# Patient Record
Sex: Male | Born: 1945 | Race: White | Hispanic: No | Smoking: Never smoker
Health system: Southern US, Community
[De-identification: ages and names within clinical notes are randomized; demographics above are authoritative.]

## PROBLEM LIST (undated history)

## (undated) DIAGNOSIS — I119 Hypertensive heart 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: Secondary | ICD-10-CM

## (undated) DIAGNOSIS — N400 Enlarged prostate without lower urinary tract symptoms: Secondary | ICD-10-CM

## (undated) DIAGNOSIS — E785 Hyperlipidemia, unspecified: Secondary | ICD-10-CM

## (undated) DIAGNOSIS — R972 Elevated prostate specific antigen [PSA]: Secondary | ICD-10-CM

## (undated) DIAGNOSIS — G4733 Obstructive sleep apnea (adult) (pediatric): Secondary | ICD-10-CM

## (undated) DIAGNOSIS — M1710 Unilateral primary osteoarthritis, unspecified knee: Secondary | ICD-10-CM

## (undated) DIAGNOSIS — I10 Essential (primary) hypertension: Secondary | ICD-10-CM

## (undated) DIAGNOSIS — I499 Cardiac arrhythmia, unspecified: Secondary | ICD-10-CM

## (undated) DIAGNOSIS — M75102 Unspecified rotator cuff tear or rupture of left shoulder, not specified as traumatic: Secondary | ICD-10-CM

## (undated) HISTORY — DX: Hypertensive heart 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: I11.9

## (undated) HISTORY — DX: Elevated prostate specific antigen (PSA): R97.20

## (undated) HISTORY — DX: Unspecified rotator cuff tear or rupture of left shoulder, not specified as traumatic: M75.102

## (undated) HISTORY — PX: REPLACEMENT TOTAL KNEE: SUR1224

## (undated) HISTORY — DX: Benign prostatic hyperplasia without lower urinary tract symptoms: N40.0

## (undated) HISTORY — DX: Cardiac arrhythmia, unspecified: I49.9

## (undated) HISTORY — PX: CATARACT EXTRACTION W/ INTRAOCULAR LENS IMPLANT: SHX1309

## (undated) HISTORY — DX: Essential (primary) hypertension: I10

## (undated) HISTORY — DX: Hyperlipidemia, unspecified: E78.5

## (undated) HISTORY — DX: Unilateral primary osteoarthritis, unspecified knee: M17.10

## (undated) HISTORY — DX: Obstructive sleep apnea (adult) (pediatric): G47.33

## (undated) HISTORY — PX: KNEE SURGERY: SHX244

---

## 2014-02-15 ENCOUNTER — Ambulatory Visit: Payer: Self-pay

## 2015-04-20 DIAGNOSIS — J019 Acute sinusitis, unspecified: Secondary | ICD-10-CM | POA: Diagnosis not present

## 2015-04-20 DIAGNOSIS — J111 Influenza due to unidentified influenza virus with other respiratory manifestations: Secondary | ICD-10-CM | POA: Diagnosis not present

## 2015-06-03 DIAGNOSIS — E785 Hyperlipidemia, unspecified: Secondary | ICD-10-CM | POA: Insufficient documentation

## 2015-06-03 DIAGNOSIS — I499 Cardiac arrhythmia, unspecified: Secondary | ICD-10-CM

## 2015-06-03 DIAGNOSIS — M75102 Unspecified rotator cuff tear or rupture of left shoulder, not specified as traumatic: Secondary | ICD-10-CM

## 2015-06-03 DIAGNOSIS — M179 Osteoarthritis of knee, unspecified: Secondary | ICD-10-CM

## 2015-06-03 DIAGNOSIS — Z298 Encounter for other specified prophylactic measures: Secondary | ICD-10-CM | POA: Diagnosis not present

## 2015-06-03 DIAGNOSIS — I119 Hypertensive heart 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: Secondary | ICD-10-CM

## 2015-06-03 DIAGNOSIS — I498 Other specified cardiac arrhythmias: Secondary | ICD-10-CM

## 2015-06-03 DIAGNOSIS — I10 Essential (primary) hypertension: Secondary | ICD-10-CM | POA: Diagnosis not present

## 2015-06-03 DIAGNOSIS — M170 Bilateral primary osteoarthritis of knee: Secondary | ICD-10-CM | POA: Diagnosis not present

## 2015-06-03 DIAGNOSIS — M1710 Unilateral primary osteoarthritis, unspecified knee: Secondary | ICD-10-CM

## 2015-06-03 HISTORY — DX: Other specified cardiac arrhythmias: I49.8

## 2015-06-03 HISTORY — DX: Hypertensive heart 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: I11.9

## 2015-06-03 HISTORY — DX: Osteoarthritis of knee, unspecified: M17.9

## 2015-06-03 HISTORY — DX: Unilateral primary osteoarthritis, unspecified knee: M17.10

## 2015-06-03 HISTORY — DX: Unspecified rotator cuff tear or rupture of left shoulder, not specified as traumatic: M75.102

## 2015-06-09 DIAGNOSIS — M7502 Adhesive capsulitis of left shoulder: Secondary | ICD-10-CM | POA: Diagnosis not present

## 2015-06-09 DIAGNOSIS — M25512 Pain in left shoulder: Secondary | ICD-10-CM | POA: Diagnosis not present

## 2015-06-10 DIAGNOSIS — I10 Essential (primary) hypertension: Secondary | ICD-10-CM | POA: Insufficient documentation

## 2015-06-10 DIAGNOSIS — I1A0 Resistant hypertension: Secondary | ICD-10-CM | POA: Insufficient documentation

## 2015-06-12 DIAGNOSIS — M25512 Pain in left shoulder: Secondary | ICD-10-CM | POA: Diagnosis not present

## 2015-06-12 DIAGNOSIS — R937 Abnormal findings on diagnostic imaging of other parts of musculoskeletal system: Secondary | ICD-10-CM | POA: Diagnosis not present

## 2015-06-12 DIAGNOSIS — M19012 Primary osteoarthritis, left shoulder: Secondary | ICD-10-CM | POA: Diagnosis not present

## 2015-06-13 DIAGNOSIS — I10 Essential (primary) hypertension: Secondary | ICD-10-CM | POA: Diagnosis not present

## 2015-06-16 DIAGNOSIS — M7502 Adhesive capsulitis of left shoulder: Secondary | ICD-10-CM | POA: Diagnosis not present

## 2015-06-16 DIAGNOSIS — M25512 Pain in left shoulder: Secondary | ICD-10-CM | POA: Diagnosis not present

## 2015-06-16 DIAGNOSIS — M7552 Bursitis of left shoulder: Secondary | ICD-10-CM | POA: Diagnosis not present

## 2015-06-18 DIAGNOSIS — M7552 Bursitis of left shoulder: Secondary | ICD-10-CM | POA: Diagnosis not present

## 2015-06-18 DIAGNOSIS — M7502 Adhesive capsulitis of left shoulder: Secondary | ICD-10-CM | POA: Diagnosis not present

## 2015-06-18 DIAGNOSIS — M25512 Pain in left shoulder: Secondary | ICD-10-CM | POA: Diagnosis not present

## 2015-06-24 DIAGNOSIS — M7552 Bursitis of left shoulder: Secondary | ICD-10-CM | POA: Diagnosis not present

## 2015-06-24 DIAGNOSIS — M7502 Adhesive capsulitis of left shoulder: Secondary | ICD-10-CM | POA: Diagnosis not present

## 2015-06-24 DIAGNOSIS — M25512 Pain in left shoulder: Secondary | ICD-10-CM | POA: Diagnosis not present

## 2015-06-26 DIAGNOSIS — M25512 Pain in left shoulder: Secondary | ICD-10-CM | POA: Diagnosis not present

## 2015-06-26 DIAGNOSIS — M7502 Adhesive capsulitis of left shoulder: Secondary | ICD-10-CM | POA: Diagnosis not present

## 2015-06-26 DIAGNOSIS — M7552 Bursitis of left shoulder: Secondary | ICD-10-CM | POA: Diagnosis not present

## 2015-06-30 DIAGNOSIS — M7552 Bursitis of left shoulder: Secondary | ICD-10-CM | POA: Diagnosis not present

## 2015-06-30 DIAGNOSIS — M25512 Pain in left shoulder: Secondary | ICD-10-CM | POA: Diagnosis not present

## 2015-06-30 DIAGNOSIS — M7502 Adhesive capsulitis of left shoulder: Secondary | ICD-10-CM | POA: Diagnosis not present

## 2015-07-02 DIAGNOSIS — M25512 Pain in left shoulder: Secondary | ICD-10-CM | POA: Diagnosis not present

## 2015-07-02 DIAGNOSIS — M7502 Adhesive capsulitis of left shoulder: Secondary | ICD-10-CM | POA: Diagnosis not present

## 2015-07-02 DIAGNOSIS — M7552 Bursitis of left shoulder: Secondary | ICD-10-CM | POA: Diagnosis not present

## 2015-07-03 DIAGNOSIS — M25512 Pain in left shoulder: Secondary | ICD-10-CM | POA: Diagnosis not present

## 2015-07-03 DIAGNOSIS — M7502 Adhesive capsulitis of left shoulder: Secondary | ICD-10-CM | POA: Diagnosis not present

## 2015-07-03 DIAGNOSIS — M7552 Bursitis of left shoulder: Secondary | ICD-10-CM | POA: Diagnosis not present

## 2015-07-07 DIAGNOSIS — M7552 Bursitis of left shoulder: Secondary | ICD-10-CM | POA: Diagnosis not present

## 2015-07-07 DIAGNOSIS — M25512 Pain in left shoulder: Secondary | ICD-10-CM | POA: Diagnosis not present

## 2015-07-07 DIAGNOSIS — M7502 Adhesive capsulitis of left shoulder: Secondary | ICD-10-CM | POA: Diagnosis not present

## 2015-07-09 DIAGNOSIS — M7502 Adhesive capsulitis of left shoulder: Secondary | ICD-10-CM | POA: Diagnosis not present

## 2015-07-09 DIAGNOSIS — M25512 Pain in left shoulder: Secondary | ICD-10-CM | POA: Diagnosis not present

## 2015-07-09 DIAGNOSIS — M7552 Bursitis of left shoulder: Secondary | ICD-10-CM | POA: Diagnosis not present

## 2015-07-11 DIAGNOSIS — M25512 Pain in left shoulder: Secondary | ICD-10-CM | POA: Diagnosis not present

## 2015-07-11 DIAGNOSIS — M7502 Adhesive capsulitis of left shoulder: Secondary | ICD-10-CM | POA: Diagnosis not present

## 2015-07-11 DIAGNOSIS — M7552 Bursitis of left shoulder: Secondary | ICD-10-CM | POA: Diagnosis not present

## 2015-07-23 DIAGNOSIS — M7502 Adhesive capsulitis of left shoulder: Secondary | ICD-10-CM | POA: Diagnosis not present

## 2015-08-18 DIAGNOSIS — E785 Hyperlipidemia, unspecified: Secondary | ICD-10-CM | POA: Diagnosis not present

## 2015-08-18 DIAGNOSIS — I10 Essential (primary) hypertension: Secondary | ICD-10-CM | POA: Diagnosis not present

## 2015-10-06 DIAGNOSIS — H26492 Other secondary cataract, left eye: Secondary | ICD-10-CM | POA: Diagnosis not present

## 2015-10-06 DIAGNOSIS — H52203 Unspecified astigmatism, bilateral: Secondary | ICD-10-CM | POA: Diagnosis not present

## 2015-10-06 DIAGNOSIS — Z961 Presence of intraocular lens: Secondary | ICD-10-CM | POA: Diagnosis not present

## 2015-10-30 DIAGNOSIS — H26492 Other secondary cataract, left eye: Secondary | ICD-10-CM | POA: Diagnosis not present

## 2015-11-26 DIAGNOSIS — I10 Essential (primary) hypertension: Secondary | ICD-10-CM | POA: Diagnosis not present

## 2015-11-26 DIAGNOSIS — E785 Hyperlipidemia, unspecified: Secondary | ICD-10-CM | POA: Diagnosis not present

## 2015-12-19 DIAGNOSIS — I10 Essential (primary) hypertension: Secondary | ICD-10-CM | POA: Diagnosis not present

## 2016-01-08 ENCOUNTER — Other Ambulatory Visit (HOSPITAL_BASED_OUTPATIENT_CLINIC_OR_DEPARTMENT_OTHER): Payer: Self-pay

## 2016-01-08 DIAGNOSIS — R0683 Snoring: Secondary | ICD-10-CM

## 2016-01-08 DIAGNOSIS — I10 Essential (primary) hypertension: Secondary | ICD-10-CM

## 2016-02-11 ENCOUNTER — Ambulatory Visit (HOSPITAL_BASED_OUTPATIENT_CLINIC_OR_DEPARTMENT_OTHER): Payer: PPO | Attending: Cardiology | Admitting: Internal Medicine

## 2016-02-11 VITALS — Ht 72.0 in | Wt 222.0 lb

## 2016-02-11 DIAGNOSIS — R0902 Hypoxemia: Secondary | ICD-10-CM | POA: Insufficient documentation

## 2016-02-11 DIAGNOSIS — R0683 Snoring: Secondary | ICD-10-CM

## 2016-02-11 DIAGNOSIS — I10 Essential (primary) hypertension: Secondary | ICD-10-CM

## 2016-02-11 DIAGNOSIS — G4733 Obstructive sleep apnea (adult) (pediatric): Secondary | ICD-10-CM | POA: Insufficient documentation

## 2016-02-21 DIAGNOSIS — R0683 Snoring: Secondary | ICD-10-CM

## 2016-02-21 NOTE — Procedures (Signed)
  Patient Name: Jeremy Fox, Jeremy Fox Date: 02/11/2016 Gender: Male D.O.B: October 27, 1945 Age (years): 70 Referring Provider: Park Liter Height (inches): 60 Interpreting Physician: Baird Lyons MD, ABSM Weight (lbs): 222 RPSGT: Carolin Coy BMI: 30 MRN: [id number] Neck Size: 15.50 CLINICAL INFORMATION Sleep Study Type: NPSG  Indication for sleep study: Hypertension, Obesity, Snoring  Epworth Sleepiness Score:  SLEEP STUDY TECHNIQUE As per the AASM Manual for the Scoring of Sleep and Associated Events v2.3 (April 2016) with a hypopnea requiring 4% desaturations.  The channels recorded and monitored were frontal, central and occipital EEG, electrooculogram (EOG), submentalis EMG (chin), nasal and oral airflow, thoracic and abdominal wall motion, anterior tibialis EMG, snore microphone, electrocardiogram, and pulse oximetry.  MEDICATIONS Medications self-administered by patient taken the night of the study : none reported  SLEEP ARCHITECTURE The study was initiated at 10:38:52 PM and ended at 5:11:22 AM.  Sleep onset time was 8.4 minutes and the sleep efficiency was 73.3%. The total sleep time was 287.5 minutes.  Stage REM latency was 129.5 minutes.  The patient spent 13.39% of the night in stage N1 sleep, 68.52% in stage N2 sleep, 0.00% in stage N3 and 18.09% in REM.  Alpha intrusion was absent.  Supine sleep was 42.96%.  RESPIRATORY PARAMETERS The overall apnea/hypopnea index (AHI) was 44.2 per hour. There were 79 total apneas, including 68 obstructive, 3 central and 8 mixed apneas. There were 133 hypopneas and 10 RERAs.  The AHI during Stage REM sleep was 3.5 per hour.  AHI while supine was 69.5 per hour.  The mean oxygen saturation was 92.23%. The minimum SpO2 during sleep was 72.00%.  Moderate snoring was noted during this study.  CARDIAC DATA The 2 lead EKG demonstrated sinus rhythm. The mean heart rate was 64.58 beats per minute. Other EKG findings  include: PVCs.  LEG MOVEMENT DATA The total PLMS were 1 with a resulting PLMS index of 0.21. Associated arousal with leg movement index was 0.2 .  IMPRESSIONS - Severe obstructive sleep apnea occurred during this study (AHI = 44.2/h). - No significant central sleep apnea occurred during this study (CAI = 0.6/h). - Moderate oxygen desaturation was noted during this study (Min O2 = 72.00%). - The patient snored with Moderate snoring volume. - EKG findings include PVCs. - Clinically significant periodic limb movements did not occur during sleep. No significant associated arousals.  DIAGNOSIS - Obstructive Sleep Apnea (327.23 [G47.33 ICD-10]) - Nocturnal Hypoxemia (327.26 [G47.36 ICD-10]  RECOMMENDATIONS - Therapeutic CPAP titration to determine optimal pressure required to alleviate sleep disordered breathing. - Positional therapy avoiding supine position during sleep. - Avoid alcohol, sedatives and other CNS depressants that may worsen sleep apnea and disrupt normal sleep architecture. - Sleep hygiene should be reviewed to assess factors that may improve sleep quality. - Weight management and regular exercise should be initiated or continued if appropriate.  [Electronically signed] 02/21/2016 12:37 PM  Baird Lyons MD, [place], American Board of Sleep Medicine   NPI: [id number]  [place], [facility]  ELECTRONICALLY SIGNED ON:  02/21/2016, 12:35 PM [place] PH: [phone]   FX: [phone] [place]

## 2016-03-29 DIAGNOSIS — I10 Essential (primary) hypertension: Secondary | ICD-10-CM | POA: Diagnosis not present

## 2016-03-29 DIAGNOSIS — E785 Hyperlipidemia, unspecified: Secondary | ICD-10-CM | POA: Diagnosis not present

## 2016-03-29 DIAGNOSIS — G4733 Obstructive sleep apnea (adult) (pediatric): Secondary | ICD-10-CM | POA: Insufficient documentation

## 2016-04-15 ENCOUNTER — Other Ambulatory Visit (HOSPITAL_BASED_OUTPATIENT_CLINIC_OR_DEPARTMENT_OTHER): Payer: Self-pay

## 2016-04-15 DIAGNOSIS — Z23 Encounter for immunization: Secondary | ICD-10-CM | POA: Diagnosis not present

## 2016-04-15 DIAGNOSIS — Z0000 Encounter for general adult medical examination without abnormal findings: Secondary | ICD-10-CM | POA: Diagnosis not present

## 2016-04-15 DIAGNOSIS — N400 Enlarged prostate without lower urinary tract symptoms: Secondary | ICD-10-CM | POA: Diagnosis not present

## 2016-04-15 DIAGNOSIS — Z1159 Encounter for screening for other viral diseases: Secondary | ICD-10-CM | POA: Diagnosis not present

## 2016-04-15 DIAGNOSIS — R0683 Snoring: Secondary | ICD-10-CM

## 2016-04-15 DIAGNOSIS — Z1212 Encounter for screening for malignant neoplasm of rectum: Secondary | ICD-10-CM | POA: Diagnosis not present

## 2016-04-15 DIAGNOSIS — G4733 Obstructive sleep apnea (adult) (pediatric): Secondary | ICD-10-CM

## 2016-04-15 LAB — LIPID PANEL
Cholesterol: 188 (ref 0–200)
HDL: 45 (ref 35–70)
LDL Cholesterol: 122
Triglycerides: 103 (ref 40–160)

## 2016-04-15 LAB — HEPATIC FUNCTION PANEL
ALT: 20 (ref 10–40)
AST: 29 (ref 14–40)
Alkaline Phosphatase: 72 (ref 25–125)
Bilirubin, Total: 0.6

## 2016-04-15 LAB — BASIC METABOLIC PANEL
BUN: 15 (ref 4–21)
Creatinine: 1.2 (ref 0.6–1.3)
Glucose: 65
Potassium: 4 (ref 3.4–5.3)
Sodium: 136 — AB (ref 137–147)

## 2016-04-15 LAB — HM HEPATITIS C SCREENING LAB: HM Hepatitis Screen: NEGATIVE

## 2016-04-20 ENCOUNTER — Ambulatory Visit (HOSPITAL_BASED_OUTPATIENT_CLINIC_OR_DEPARTMENT_OTHER): Payer: PPO | Attending: Cardiology | Admitting: Internal Medicine

## 2016-04-20 VITALS — Ht 72.0 in | Wt 222.0 lb

## 2016-04-20 DIAGNOSIS — R0683 Snoring: Secondary | ICD-10-CM | POA: Diagnosis not present

## 2016-04-20 DIAGNOSIS — G4733 Obstructive sleep apnea (adult) (pediatric): Secondary | ICD-10-CM | POA: Insufficient documentation

## 2016-04-27 DIAGNOSIS — R972 Elevated prostate specific antigen [PSA]: Secondary | ICD-10-CM | POA: Diagnosis not present

## 2016-04-27 DIAGNOSIS — R351 Nocturia: Secondary | ICD-10-CM | POA: Diagnosis not present

## 2016-04-27 DIAGNOSIS — N138 Other obstructive and reflux uropathy: Secondary | ICD-10-CM | POA: Diagnosis not present

## 2016-04-27 DIAGNOSIS — N401 Enlarged prostate with lower urinary tract symptoms: Secondary | ICD-10-CM | POA: Diagnosis not present

## 2016-05-08 NOTE — Procedures (Signed)
  Patient Name: Jeremy Fox, Jeremy Fox Date: 04/20/2016 Gender: Male D.O.B: Sep 11, 1945 Age (years): 69 Referring Provider: Park Liter Height (inches): 72 Interpreting Physician: Baird Lyons MD, ABSM Weight (lbs): 222 RPSGT: Madelon Lips BMI: 30 MRN: [id number] Neck Size: 15.50 CLINICAL INFORMATION The patient is referred for a CPAP titration to treat sleep apnea.   Date of NPSG, Split Night or HST:  Diagnostic NPSG 02/11/16  AHI 44.2/ hr, desaturation to 72%, body weight 222 lbs  SLEEP STUDY TECHNIQUE As per the AASM Manual for the Scoring of Sleep and Associated Events v2.3 (April 2016) with a hypopnea requiring 4% desaturations.  The channels recorded and monitored were frontal, central and occipital EEG, electrooculogram (EOG), submentalis EMG (chin), nasal and oral airflow, thoracic and abdominal wall motion, anterior tibialis EMG, snore microphone, electrocardiogram, and pulse oximetry. Continuous positive airway pressure (CPAP) was initiated at the beginning of the study and titrated to treat sleep-disordered breathing.  MEDICATIONS Medications self-administered by patient taken the night of the study : none reported  TECHNICIAN COMMENTS Comments added by technician: PATIENT TOLERATED CPAP WITHOUT DIFFICULTY. BATHROOM BREAK 2X  Comments added by scorer: N/A  RESPIRATORY PARAMETERS Optimal PAP Pressure (cm): 16 AHI at Optimal Pressure (/hr): 0.0 Overall Minimal O2 (%): 83.00 Supine % at Optimal Pressure (%): 100 Minimal O2 at Optimal Pressure (%): 93.0    SLEEP ARCHITECTURE The study was initiated at 10:49:22 PM and ended at 4:54:19 AM.  Sleep onset time was 25.6 minutes and the sleep efficiency was 79.3%. The total sleep time was 289.5 minutes.  The patient spent 8.98% of the night in stage N1 sleep, 73.75% in stage N2 sleep, 0.00% in stage N3 and 17.27% in REM.Stage REM latency was 112.5 minutes  Wake after sleep onset was 49.8. Alpha intrusion was absent.  Supine sleep was 58.91%.  CARDIAC DATA The 2 lead EKG demonstrated sinus rhythm. The mean heart rate was 58.87 beats per minute. Other EKG findings include: PVCs.  LEG MOVEMENT DATA The total Periodic Limb Movements of Sleep (PLMS) were 197. The PLMS index was 40.83. A PLMS index of <15 is considered normal in adults.  IMPRESSIONS - The optimal PAP pressure was 16 cm of water. - Central sleep apnea was not noted during this titration (CAI = 0.0/h). - Moderate oxygen desaturations were observed during this titration (min O2 = 83.00%). - The patient snored with Moderate snoring volume during this titration study. - 2-lead EKG demonstrated: PVCs - Moderate periodic limb movements were observed during this study. Arousals associated with PLMs were rare.  DIAGNOSIS - Obstructive Sleep Apnea (327.23 [G47.33 ICD-10])  RECOMMENDATIONS - Trial of CPAP therapy on 16 cm H2O with a Medium size Fisher&Paykel Full Face Mask Simplus mask and heated humidification. - Avoid alcohol, sedatives and other CNS depressants that may worsen sleep apnea and disrupt normal sleep architecture. - Sleep hygiene should be reviewed to assess factors that may improve sleep quality. - Weight management and regular exercise should be initiated or continued. -  [Electronically signed] 05/08/2016 10:35 AM  Baird Lyons MD, ABSM Diplomate, American Board of Sleep Medicine   NPI: [id number]  [place], American Board of Sleep Medicine  ELECTRONICALLY SIGNED ON:  05/08/2016, 10:34 AM [place] PH: [phone]   FX: [phone] [place]

## 2016-05-11 ENCOUNTER — Encounter: Payer: Self-pay | Admitting: Pulmonary Disease

## 2016-05-11 ENCOUNTER — Ambulatory Visit (INDEPENDENT_AMBULATORY_CARE_PROVIDER_SITE_OTHER): Payer: PPO | Admitting: Pulmonary Disease

## 2016-05-11 VITALS — BP 118/80 | HR 82 | Ht 72.0 in | Wt 233.0 lb

## 2016-05-11 DIAGNOSIS — G4733 Obstructive sleep apnea (adult) (pediatric): Secondary | ICD-10-CM | POA: Diagnosis not present

## 2016-05-11 NOTE — Patient Instructions (Addendum)
CPAP therapy on 16 cm H2O with a Medium size Fisher&Paykel Full Face Mask Simplus mask and heated humidification

## 2016-05-11 NOTE — Progress Notes (Signed)
Subjective:    Patient ID: Jeremy Fox, male    DOB: [birth year], 71 y.o.   MRN: [id number]  HPI   Chief Complaint  Patient presents with  . Pulm Consult    Cardiologist referred.     71 year old man referred by his cardiologist for evaluation of sleep apnea. He reports loud snoring and has been told by family members and friends that they have witnessed apneas while he is sleeping. Epworth sleepiness score is 16 and a report sleepiness while sitting and reading, watching TV, lying down to rest in the afternoons or sitting quietly after lunch. Bedtime is between 8 and 10 PM, sleep latency is minimal, he sleeps on his right side with one to 2 pillows, reports frequent nocturnal awakenings without nocturia and is out of bed by 7 AM still feeling tired with occasional dryness of mouth. He gained about 10 pounds over the last 2 years. There is no history suggestive of cataplexy, sleep paralysis or parasomnias  He worked for [employer] for 30 years and is now retired He has hypertension controlled on 2 medications.  PSG 02/2016 showed severe OSA with AHI 44/hour and lowest desaturation to 72% this was worse in the supine position with AHI 69/hour.  CPAP titration was performed on 04/20/16 with correction of events on CPAP of 16 cm with a medium fullface mask. He feels that he slept very well that night and only workup once he was able to sleep without interruption until 5 AM and felt rested when he woke up    Past Medical History:  Diagnosis Date  . Dyslipidemia   . Essential hypertension   . OSA (obstructive sleep apnea)      No past surgical history on file.  Allergies  Allergen Reactions  . Sulfa Antibiotics Rash     Social History   Social History  . Marital status: Single    Spouse name: N/A  . Number of children: N/A  . Years of education: N/A   Occupational History  . Not on file.   Social History Main Topics  . Smoking status: Never Smoker  . Smokeless  tobacco: Never Used  . Alcohol use Yes  . Drug use: No  . Sexual activity: Not on file   Other Topics Concern  . Not on file   Social History Narrative  . No narrative on file     Family History  Problem Relation Age of Onset  . Heart attack Father      Review of Systems Constitutional: negative for anorexia, fevers and sweats  Eyes: negative for irritation, redness and visual disturbance  Ears, nose, mouth, throat, and face: negative for earaches, epistaxis, nasal congestion and sore throat  Respiratory: negative for cough, dyspnea on exertion, sputum and wheezing  Cardiovascular: negative for chest pain, dyspnea, lower extremity edema, orthopnea, palpitations and syncope  Gastrointestinal: negative for abdominal pain, constipation, diarrhea, melena, nausea and vomiting  Genitourinary:negative for dysuria, frequency and hematuria  Hematologic/lymphatic: negative for bleeding, easy bruising and lymphadenopathy  Musculoskeletal:negative for arthralgias, muscle weakness and stiff joints  Neurological: negative for coordination problems, gait problems, headaches and weakness  Endocrine: negative for diabetic symptoms including polydipsia, polyuria and weight loss     Objective:   Physical Exam  Gen. Pleasant, obese, in no distress, normal affect ENT - no lesions, no post nasal drip, class 2-3 airway Neck: No JVD, no thyromegaly, no carotid bruits Lungs: no use of accessory muscles, no dullness to percussion, decreased without  rales or rhonchi  Cardiovascular: Rhythm regular, heart sounds  normal, no murmurs or gallops, no peripheral edema Abdomen: soft and non-tender, no hepatosplenomegaly, BS normal. Musculoskeletal: No deformities, no cyanosis or clubbing Neuro:  alert, non focal, no tremors       Assessment & Plan:

## 2016-05-11 NOTE — Assessment & Plan Note (Signed)
We will start him on CPAP therapy on 16 cm H2O with a Medium size Fisher&Paykel Full Face Mask Simplus mask and heated humidification  Download will be obtained in 4 weeks and we will confirm good control of events in good compliance. If he is unable to tolerate high pressure we can change him to auto settings 10-16 cm  Weight loss encouraged, compliance with goal of at least 4-6 hrs every night is the expectation. Advised against medications with sedative side effects Cautioned against driving when sleepy - understanding that sleepiness will vary on a day to day basis

## 2016-05-26 DIAGNOSIS — G4733 Obstructive sleep apnea (adult) (pediatric): Secondary | ICD-10-CM | POA: Diagnosis not present

## 2016-06-26 DIAGNOSIS — G4733 Obstructive sleep apnea (adult) (pediatric): Secondary | ICD-10-CM | POA: Diagnosis not present

## 2016-07-04 ENCOUNTER — Encounter: Payer: Self-pay | Admitting: Pulmonary Disease

## 2016-07-05 DIAGNOSIS — R768 Other specified abnormal immunological findings in serum: Secondary | ICD-10-CM | POA: Diagnosis not present

## 2016-07-06 ENCOUNTER — Ambulatory Visit (INDEPENDENT_AMBULATORY_CARE_PROVIDER_SITE_OTHER): Payer: PPO | Admitting: Pulmonary Disease

## 2016-07-06 ENCOUNTER — Encounter: Payer: Self-pay | Admitting: Pulmonary Disease

## 2016-07-06 DIAGNOSIS — I10 Essential (primary) hypertension: Secondary | ICD-10-CM

## 2016-07-06 DIAGNOSIS — G4733 Obstructive sleep apnea (adult) (pediatric): Secondary | ICD-10-CM | POA: Diagnosis not present

## 2016-07-06 NOTE — Progress Notes (Signed)
   Subjective:    Patient ID: Jeremy Fox, male    DOB: [birth year], 71 y.o.   MRN: [id number]  HPI  71 year old for FU of sleep apnea.  He worked for [employer] for 30 years  He has hypertension controlled on 2 medications.  He has settled down well with the CPAP machine, is tolerating pressure quite well. Had a large leak on his mask, tried nasal pillows but is now back to full face mask. He has noticed good improvement in his concentration, ability to stay awake , denies daytime sleepiness and has improved memory  Download shows excellent control of events on 16 cm with residual AHI 4/hour with mild leak and good compliance about 5.5 hours every night    Significant tests/ events reviewed  PSG 02/2016 showed severe OSA with AHI 44/hour and lowest desaturation to 72% this was worse in the supine position with AHI 69/hour.  CPAP titration  04/20/16 >> 16 cm with a medium fullface mask.   Review of Systems Patient denies significant dyspnea,cough, hemoptysis,  chest pain, palpitations, pedal edema, orthopnea, paroxysmal nocturnal dyspnea, lightheadedness, nausea, vomiting, abdominal or  leg pains      Objective:   Physical Exam  Gen. Pleasant, well-nourished, in no distress ENT - no thrush, no post nasal drip Neck: No JVD, no thyromegaly, no carotid bruits Lungs: no use of accessory muscles, no dullness to percussion, clear without rales or rhonchi  Cardiovascular: Rhythm regular, heart sounds  normal, no murmurs or gallops, no peripheral edema Musculoskeletal: No deformities, no cyanosis or clubbing        Assessment & Plan:

## 2016-07-06 NOTE — Assessment & Plan Note (Signed)
CPAP is set at 16 cm. Expectation is that you will use it 4-6 hours every night. We will set you up with a different DME  Weight loss encouraged, compliance with goal of at least 4-6 hrs every night is the expectation. Advised against medications with sedative side effects Cautioned against driving when sleepy - understanding that sleepiness will vary on a day to day basis

## 2016-07-06 NOTE — Patient Instructions (Signed)
Your CPAP is set at 16 cm. Expectation is that you will use it 4-6 hours every night. We will set you up with a different DME

## 2016-07-06 NOTE — Assessment & Plan Note (Signed)
Controlled.  

## 2016-07-26 DIAGNOSIS — R351 Nocturia: Secondary | ICD-10-CM | POA: Diagnosis not present

## 2016-07-26 DIAGNOSIS — N401 Enlarged prostate with lower urinary tract symptoms: Secondary | ICD-10-CM | POA: Diagnosis not present

## 2016-07-26 DIAGNOSIS — G4733 Obstructive sleep apnea (adult) (pediatric): Secondary | ICD-10-CM | POA: Diagnosis not present

## 2016-07-26 DIAGNOSIS — N138 Other obstructive and reflux uropathy: Secondary | ICD-10-CM | POA: Diagnosis not present

## 2016-07-26 DIAGNOSIS — R972 Elevated prostate specific antigen [PSA]: Secondary | ICD-10-CM | POA: Diagnosis not present

## 2016-07-29 DIAGNOSIS — G4733 Obstructive sleep apnea (adult) (pediatric): Secondary | ICD-10-CM | POA: Diagnosis not present

## 2016-07-29 DIAGNOSIS — E785 Hyperlipidemia, unspecified: Secondary | ICD-10-CM | POA: Diagnosis not present

## 2016-07-29 DIAGNOSIS — I10 Essential (primary) hypertension: Secondary | ICD-10-CM | POA: Diagnosis not present

## 2016-07-30 DIAGNOSIS — G4733 Obstructive sleep apnea (adult) (pediatric): Secondary | ICD-10-CM | POA: Diagnosis not present

## 2016-07-30 DIAGNOSIS — I10 Essential (primary) hypertension: Secondary | ICD-10-CM | POA: Diagnosis not present

## 2016-07-30 DIAGNOSIS — F446 Conversion disorder with sensory symptom or deficit: Secondary | ICD-10-CM | POA: Diagnosis not present

## 2016-07-30 DIAGNOSIS — Z8659 Personal history of other mental and behavioral disorders: Secondary | ICD-10-CM | POA: Diagnosis not present

## 2016-08-30 DIAGNOSIS — F446 Conversion disorder with sensory symptom or deficit: Secondary | ICD-10-CM | POA: Diagnosis not present

## 2016-08-30 DIAGNOSIS — G4733 Obstructive sleep apnea (adult) (pediatric): Secondary | ICD-10-CM | POA: Diagnosis not present

## 2016-08-30 DIAGNOSIS — Z8659 Personal history of other mental and behavioral disorders: Secondary | ICD-10-CM | POA: Diagnosis not present

## 2016-08-30 DIAGNOSIS — I10 Essential (primary) hypertension: Secondary | ICD-10-CM | POA: Diagnosis not present

## 2016-09-06 DIAGNOSIS — L309 Dermatitis, unspecified: Secondary | ICD-10-CM | POA: Diagnosis not present

## 2016-09-06 DIAGNOSIS — I10 Essential (primary) hypertension: Secondary | ICD-10-CM | POA: Diagnosis not present

## 2016-09-07 DIAGNOSIS — H52203 Unspecified astigmatism, bilateral: Secondary | ICD-10-CM | POA: Diagnosis not present

## 2016-09-07 DIAGNOSIS — Z961 Presence of intraocular lens: Secondary | ICD-10-CM | POA: Diagnosis not present

## 2016-09-29 DIAGNOSIS — F446 Conversion disorder with sensory symptom or deficit: Secondary | ICD-10-CM | POA: Diagnosis not present

## 2016-09-29 DIAGNOSIS — I10 Essential (primary) hypertension: Secondary | ICD-10-CM | POA: Diagnosis not present

## 2016-09-29 DIAGNOSIS — Z8659 Personal history of other mental and behavioral disorders: Secondary | ICD-10-CM | POA: Diagnosis not present

## 2016-09-29 DIAGNOSIS — G4733 Obstructive sleep apnea (adult) (pediatric): Secondary | ICD-10-CM | POA: Diagnosis not present

## 2016-10-11 DIAGNOSIS — G4733 Obstructive sleep apnea (adult) (pediatric): Secondary | ICD-10-CM | POA: Diagnosis not present

## 2016-10-12 ENCOUNTER — Encounter: Payer: Self-pay | Admitting: Adult Health

## 2016-10-12 ENCOUNTER — Ambulatory Visit (INDEPENDENT_AMBULATORY_CARE_PROVIDER_SITE_OTHER): Payer: PPO | Admitting: Adult Health

## 2016-10-12 DIAGNOSIS — G4733 Obstructive sleep apnea (adult) (pediatric): Secondary | ICD-10-CM | POA: Diagnosis not present

## 2016-10-12 NOTE — Assessment & Plan Note (Signed)
Well controlled on CPAP   Plan  Patient Instructions  Keep up the good work.  Wear CPAP At bedtime  For at least 4-6 hrs.  Keep active and healthy lifestyle.  Do not drive if sleepy.  Follow up Dr. Elsworth Soho  In 1 year and As needed

## 2016-10-12 NOTE — Progress Notes (Signed)
@Patient  ID: Jeremy Fox, male    DOB: 09-03-1945, 72 y.o.   MRN: [id number]  Chief Complaint  Patient presents with  . Follow-up    OSA     Referring provider: Daphene Calamity  HPI: 71 yo male followed for Severe OSA  TEST  Significant tests/ events reviewed  PSG 02/2016 showed severe OSA with AHI 44/hour and lowest desaturation to 72% this was worse in the supine position with AHI 69/hour.  CPAP titration  04/20/16 >> 16 cm with a medium fullface mask.   10/12/2016 Follow up : OSA  Patient returns for three-month follow-up. Patient has known severe sleep apnea. Patient says he is doing so much better. He feels rested and has a lot more energy. He says this summer. He has been able to do so many more activities in his normal. He is wearing his C Pap machine every single night for at least 5 hours. Download shows excellent compliance with average usage at 5.25 hours. AHI 1.3. Patient's on a set pressure of 16 cm H2O..    Allergies  Allergen Reactions  . Sulfa Antibiotics Rash     There is no immunization history on file for this patient.  Past Medical History:  Diagnosis Date  . Dyslipidemia   . Essential hypertension   . OSA (obstructive sleep apnea)     Tobacco History: History  Smoking Status  . Never Smoker  Smokeless Tobacco  . Never Used   Counseling given: Not Answered   Outpatient Encounter Prescriptions as of 10/12/2016  Medication Sig  . amLODipine-benazepril (LOTREL) 10-40 MG capsule Take 1 capsule by mouth daily.  . [DISCONTINUED] hydrochlorothiazide (HYDRODIURIL) 25 MG tablet Take 25 mg by mouth daily.   No facility-administered encounter medications on file as of 10/12/2016.      Review of Systems  Constitutional:   No  weight loss, night sweats,  Fevers, chills, fatigue, or  lassitude.  HEENT:   No headaches,  Difficulty swallowing,  Tooth/dental problems, or  Sore throat,                No sneezing, itching, ear ache, nasal  congestion, post nasal drip,   CV:  No chest pain,  Orthopnea, PND, swelling in lower extremities, anasarca, dizziness, palpitations, syncope.   GI  No heartburn, indigestion, abdominal pain, nausea, vomiting, diarrhea, change in bowel habits, loss of appetite, bloody stools.   Resp: No shortness of breath with exertion or at rest.  No excess mucus, no productive cough,  No non-productive cough,  No coughing up of blood.  No change in color of mucus.  No wheezing.  No chest wall deformity  Skin: no rash or lesions.  GU: no dysuria, change in color of urine, no urgency or frequency.  No flank pain, no hematuria   MS:  No joint pain or swelling.  No decreased range of motion.  No back pain.    Physical Exam  BP 126/68 (BP Location: Left Arm, Cuff Size: Normal)   Pulse 75   Ht 6' (1.829 m)   Wt 229 lb 9.6 oz (104.1 kg)   SpO2 97%   BMI 31.14 kg/m   GEN: A/Ox3; pleasant , NAD, overweight    HEENT:  [place]/AT,  EACs-clear, TMs-wnl, NOSE-clear, THROAT-clear, no lesions, no postnasal drip or exudate noted. Class 2 MP airway   NECK:  Supple w/ fair ROM; no JVD; normal carotid impulses w/o bruits; no thyromegaly or nodules palpated; no lymphadenopathy.    RESP  Clear  P & A; w/o, wheezes/ rales/ or rhonchi. no accessory muscle use, no dullness to percussion  CARD:  RRR, no m/r/g, no peripheral edema, pulses intact, no cyanosis or clubbing.  GI:   Soft & nt; nml bowel sounds; no organomegaly or masses detected.   Musco: Warm bil, no deformities or joint swelling noted.   Neuro: alert, no focal deficits noted.    Skin: Warm, no lesions or rashes    Lab Results:  CBC No results found for: WBC, RBC, HGB, HCT, PLT, MCV, MCH, MCHC, RDW, LYMPHSABS, MONOABS, EOSABS, BASOSABS  BMET No results found for: NA, K, CL, CO2, GLUCOSE, BUN, CREATININE, CALCIUM, GFRNONAA, GFRAA  BNP No results found for: BNP  ProBNP No results found for: PROBNP  Imaging: No results  found.   Assessment & Plan:   OSA (obstructive sleep apnea) Well controlled on CPAP   Plan  Patient Instructions  Keep up the good work.  Wear CPAP At bedtime  For at least 4-6 hrs.  Keep active and healthy lifestyle.  Do not drive if sleepy.  Follow up Dr. Elsworth Soho  In 1 year and As needed         Rexene Edison, NP 10/12/2016

## 2016-10-12 NOTE — Patient Instructions (Signed)
Keep up the good work.  Wear CPAP At bedtime  For at least 4-6 hrs.  Keep active and healthy lifestyle.  Do not drive if sleepy.  Follow up Dr. Elsworth Soho  In 1 year and As needed

## 2016-10-30 DIAGNOSIS — F446 Conversion disorder with sensory symptom or deficit: Secondary | ICD-10-CM | POA: Diagnosis not present

## 2016-10-30 DIAGNOSIS — I10 Essential (primary) hypertension: Secondary | ICD-10-CM | POA: Diagnosis not present

## 2016-10-30 DIAGNOSIS — Z8659 Personal history of other mental and behavioral disorders: Secondary | ICD-10-CM | POA: Diagnosis not present

## 2016-10-30 DIAGNOSIS — G4733 Obstructive sleep apnea (adult) (pediatric): Secondary | ICD-10-CM | POA: Diagnosis not present

## 2016-10-31 DIAGNOSIS — J0100 Acute maxillary sinusitis, unspecified: Secondary | ICD-10-CM | POA: Diagnosis not present

## 2016-11-30 DIAGNOSIS — G4733 Obstructive sleep apnea (adult) (pediatric): Secondary | ICD-10-CM | POA: Diagnosis not present

## 2016-11-30 DIAGNOSIS — F446 Conversion disorder with sensory symptom or deficit: Secondary | ICD-10-CM | POA: Diagnosis not present

## 2016-11-30 DIAGNOSIS — I10 Essential (primary) hypertension: Secondary | ICD-10-CM | POA: Diagnosis not present

## 2016-11-30 DIAGNOSIS — Z8659 Personal history of other mental and behavioral disorders: Secondary | ICD-10-CM | POA: Diagnosis not present

## 2016-12-30 DIAGNOSIS — F446 Conversion disorder with sensory symptom or deficit: Secondary | ICD-10-CM | POA: Diagnosis not present

## 2016-12-30 DIAGNOSIS — G4733 Obstructive sleep apnea (adult) (pediatric): Secondary | ICD-10-CM | POA: Diagnosis not present

## 2016-12-30 DIAGNOSIS — I10 Essential (primary) hypertension: Secondary | ICD-10-CM | POA: Diagnosis not present

## 2016-12-30 DIAGNOSIS — Z8659 Personal history of other mental and behavioral disorders: Secondary | ICD-10-CM | POA: Diagnosis not present

## 2017-01-13 DIAGNOSIS — G4733 Obstructive sleep apnea (adult) (pediatric): Secondary | ICD-10-CM | POA: Diagnosis not present

## 2017-01-30 DIAGNOSIS — F446 Conversion disorder with sensory symptom or deficit: Secondary | ICD-10-CM | POA: Diagnosis not present

## 2017-01-30 DIAGNOSIS — I10 Essential (primary) hypertension: Secondary | ICD-10-CM | POA: Diagnosis not present

## 2017-01-30 DIAGNOSIS — G4733 Obstructive sleep apnea (adult) (pediatric): Secondary | ICD-10-CM | POA: Diagnosis not present

## 2017-01-30 DIAGNOSIS — Z8659 Personal history of other mental and behavioral disorders: Secondary | ICD-10-CM | POA: Diagnosis not present

## 2017-02-18 DIAGNOSIS — G4733 Obstructive sleep apnea (adult) (pediatric): Secondary | ICD-10-CM | POA: Diagnosis not present

## 2017-02-20 DIAGNOSIS — G4733 Obstructive sleep apnea (adult) (pediatric): Secondary | ICD-10-CM | POA: Diagnosis not present

## 2017-03-01 DIAGNOSIS — Z8659 Personal history of other mental and behavioral disorders: Secondary | ICD-10-CM | POA: Diagnosis not present

## 2017-03-01 DIAGNOSIS — G4733 Obstructive sleep apnea (adult) (pediatric): Secondary | ICD-10-CM | POA: Diagnosis not present

## 2017-03-01 DIAGNOSIS — F446 Conversion disorder with sensory symptom or deficit: Secondary | ICD-10-CM | POA: Diagnosis not present

## 2017-03-01 DIAGNOSIS — I10 Essential (primary) hypertension: Secondary | ICD-10-CM | POA: Diagnosis not present

## 2017-03-08 DIAGNOSIS — G4733 Obstructive sleep apnea (adult) (pediatric): Secondary | ICD-10-CM | POA: Diagnosis not present

## 2017-03-08 DIAGNOSIS — I10 Essential (primary) hypertension: Secondary | ICD-10-CM | POA: Diagnosis not present

## 2017-03-08 DIAGNOSIS — F446 Conversion disorder with sensory symptom or deficit: Secondary | ICD-10-CM | POA: Diagnosis not present

## 2017-03-08 DIAGNOSIS — Z8659 Personal history of other mental and behavioral disorders: Secondary | ICD-10-CM | POA: Diagnosis not present

## 2017-03-14 ENCOUNTER — Encounter: Payer: Self-pay | Admitting: *Deleted

## 2017-03-14 ENCOUNTER — Other Ambulatory Visit: Payer: Self-pay | Admitting: *Deleted

## 2017-03-18 ENCOUNTER — Encounter: Payer: Self-pay | Admitting: Cardiology

## 2017-03-18 ENCOUNTER — Ambulatory Visit: Payer: PPO | Admitting: Cardiology

## 2017-03-18 VITALS — BP 160/86 | HR 75 | Ht 72.0 in | Wt 234.0 lb

## 2017-03-18 DIAGNOSIS — G4733 Obstructive sleep apnea (adult) (pediatric): Secondary | ICD-10-CM

## 2017-03-18 DIAGNOSIS — I10 Essential (primary) hypertension: Secondary | ICD-10-CM

## 2017-03-18 DIAGNOSIS — E785 Hyperlipidemia, unspecified: Secondary | ICD-10-CM | POA: Diagnosis not present

## 2017-03-18 NOTE — Progress Notes (Signed)
Cardiology Office Note:    Date:  03/18/2017   ID:  Jeremy Fox, DOB July 06, 1945, MRN [id number]  PCP:  Daphene Calamity, MD  Cardiologist:  Jenne Campus, MD    Referring MD: Daphene Calamity,*   Chief Complaint  Patient presents with  . Annual Exam  Doing well  History of Present Illness:    Jeremy Fox is a 72 y.o. male with essential hypertension, dyslipidemia significant obstructive sleep apnea.  He feels great he said he has been using his CPAP mask on the regular basis he said this is the best thing ever happened to him.  Does have more energy is able to walk climb stairs with no difficulties.  Sadly his blood pressure still not optimally controlled.  We will talk about potentially adding some medications that he is resistant to do that he said that he will try to work on his weight as well as start exercising on the regular basis and see if he can lower his blood pressure that way.  Encouraged him to do this but honestly I do not think will be able to accomplish the goal without additional medication.  Past Medical History:  Diagnosis Date  . Dyslipidemia   . Essential hypertension   . 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 OA (osteoarthritis) of knee 06/03/2015  . OSA (obstructive sleep apnea)   . Rotator cuff syndrome of left shoulder 06/03/2015  . 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 amLODipine-benazepril (LOTREL) 10-40 MG capsule Take 1 capsule by mouth daily.  . B Complex-C (B-COMPLEX WITH VITAMIN C) tablet Take 1 tablet by mouth daily.  . hydrochlorothiazide (HYDRODIURIL) 25 MG tablet Take 25 mg by mouth daily.  . Multiple Vitamin (MULTIVITAMIN) capsule Take by mouth.  . Pyridoxine HCl (B-6 PO) Take by mouth daily.     Allergies:   Sulfa antibiotics and Sulfamethoxazole   Social History   Socioeconomic History  . Marital status: Single    Spouse name: None  .  Number of children: None  . Years of education: None  . Highest education level: None  Social Needs  . Financial resource strain: None  . Food insecurity - worry: None  . Food insecurity - inability: None  . Transportation needs - medical: None  . Transportation needs - non-medical: None  Occupational History  . None  Tobacco Use  . Smoking status: Never Smoker  . Smokeless tobacco: Never Used  Substance and Sexual Activity  . Alcohol use: Yes  . Drug use: No  . Sexual activity: None  Other Topics Concern  . None  Social History Narrative  . None     Family History: The patient's family history includes Heart attack in his father; Heart disease in his father. ROS:   Please see the history of present illness.    All 14 point review of systems negative except as described per history of present illness  EKGs/Labs/Other Studies Reviewed:      Recent Labs: No results found for requested labs within last 8760 hours.  Recent Lipid Panel No results found for: CHOL, TRIG, HDL, CHOLHDL, VLDL, LDLCALC, LDLDIRECT  Physical Exam:    VS:  BP (!) 160/86 (BP Location: Right Arm, Patient Position: Sitting, Cuff Size: Large)   Pulse 75   Ht 6' (1.829 m)   Wt 234 lb (106.1 kg)   SpO2 99%   BMI 31.74 kg/m  Wt Readings from Last 3 Encounters:  03/18/17 234 lb (106.1 kg)  10/12/16 229 lb 9.6 oz (104.1 kg)  07/06/16 227 lb 3.2 oz (103.1 kg)     GEN:  Well nourished, well developed in no acute distress HEENT: Normal NECK: No JVD; No carotid bruits LYMPHATICS: No lymphadenopathy CARDIAC: RRR, no murmurs, no rubs, no gallops RESPIRATORY:  Clear to auscultation without rales, wheezing or rhonchi  ABDOMEN: Soft, non-tender, non-distended MUSCULOSKELETAL:  No edema; No deformity  SKIN: Warm and dry LOWER EXTREMITIES: no swelling NEUROLOGIC:  Alert and oriented x 3 PSYCHIATRIC:  Normal affect   ASSESSMENT:    1. Essential hypertension   2. OSA (obstructive sleep apnea)     3. Dyslipidemia    PLAN:    In order of problems listed above:  1. Essential hypertension: Plan as outlined above.  I see him back in about 3 months to see if exercises on the regular basis and weight loss to help with reduction of his blood pressure if not will add some medications. 2. Obstructive sleep apnea: Followed by sleep specialist he is very satisfied with the results of it he said he feels great. 3. Dyslipidemia: We will check his fasting lipid profile today   Medication Adjustments/Labs and Tests Ordered: Current medicines are reviewed at length with the patient today.  Concerns regarding medicines are outlined above.  No orders of the defined types were placed in this encounter.  Medication changes: No orders of the defined types were placed in this encounter.   Signed, Park Liter, MD, [facility] 03/18/2017 10:05 AM    [place]

## 2017-03-18 NOTE — Addendum Note (Signed)
Addended by: Aleatha Borer on: 03/18/2017 04:41 PM   Modules accepted: Orders

## 2017-03-18 NOTE — Patient Instructions (Signed)
Medication Instructions:  Your physician recommends that you continue on your current medications as directed. Please refer to the Current Medication list given to you today.  Labwork: Your physician recommends that you have lab work today: Cholesterol  Testing/Procedures: None ordered  Follow-Up: Your physician recommends that you schedule a follow-up appointment in: 3 months with Dr. Agustin Cree  Any Other Special Instructions Will Be Listed Below (If Applicable).     If you need a refill on your cardiac medications before your next appointment, please call your pharmacy.

## 2017-03-19 LAB — LIPID PANEL
CHOL/HDL RATIO: 3.8 ratio (ref 0.0–5.0)
Cholesterol, Total: 193 mg/dL (ref 100–199)
HDL: 51 mg/dL (ref >39)
LDL Calculated: 127 mg/dL — ABNORMAL HIGH (ref 0–99)
TRIGLYCERIDES: 75 mg/dL (ref 0–149)
VLDL Cholesterol Cal: 15 mg/dL (ref 5–40)

## 2017-03-21 ENCOUNTER — Other Ambulatory Visit: Payer: Self-pay

## 2017-03-21 MED ORDER — AMLODIPINE BESY-BENAZEPRIL HCL 10-40 MG PO CAPS: 1.0000 | ORAL_CAPSULE | Freq: Every day | ORAL | 1 refills | Status: DC

## 2017-03-22 NOTE — Addendum Note (Signed)
Addended by: Mattie Marlin on: 03/22/2017 08:59 AM   Modules accepted: Orders

## 2017-04-01 DIAGNOSIS — F446 Conversion disorder with sensory symptom or deficit: Secondary | ICD-10-CM | POA: Diagnosis not present

## 2017-04-01 DIAGNOSIS — Z8659 Personal history of other mental and behavioral disorders: Secondary | ICD-10-CM | POA: Diagnosis not present

## 2017-04-01 DIAGNOSIS — I10 Essential (primary) hypertension: Secondary | ICD-10-CM | POA: Diagnosis not present

## 2017-04-01 DIAGNOSIS — G4733 Obstructive sleep apnea (adult) (pediatric): Secondary | ICD-10-CM | POA: Diagnosis not present

## 2017-05-02 DIAGNOSIS — F446 Conversion disorder with sensory symptom or deficit: Secondary | ICD-10-CM | POA: Diagnosis not present

## 2017-05-02 DIAGNOSIS — Z8659 Personal history of other mental and behavioral disorders: Secondary | ICD-10-CM | POA: Diagnosis not present

## 2017-05-02 DIAGNOSIS — G4733 Obstructive sleep apnea (adult) (pediatric): Secondary | ICD-10-CM | POA: Diagnosis not present

## 2017-05-02 DIAGNOSIS — I10 Essential (primary) hypertension: Secondary | ICD-10-CM | POA: Diagnosis not present

## 2017-05-30 DIAGNOSIS — Z8659 Personal history of other mental and behavioral disorders: Secondary | ICD-10-CM | POA: Diagnosis not present

## 2017-05-30 DIAGNOSIS — G4733 Obstructive sleep apnea (adult) (pediatric): Secondary | ICD-10-CM | POA: Diagnosis not present

## 2017-05-30 DIAGNOSIS — I10 Essential (primary) hypertension: Secondary | ICD-10-CM | POA: Diagnosis not present

## 2017-05-30 DIAGNOSIS — F446 Conversion disorder with sensory symptom or deficit: Secondary | ICD-10-CM | POA: Diagnosis not present

## 2017-06-15 DIAGNOSIS — G4733 Obstructive sleep apnea (adult) (pediatric): Secondary | ICD-10-CM | POA: Diagnosis not present

## 2017-06-16 ENCOUNTER — Ambulatory Visit: Payer: PPO | Admitting: Cardiology

## 2017-06-16 ENCOUNTER — Encounter: Payer: Self-pay | Admitting: Cardiology

## 2017-06-16 VITALS — BP 160/60 | HR 71 | Ht 72.0 in | Wt 223.8 lb

## 2017-06-16 DIAGNOSIS — E785 Hyperlipidemia, unspecified: Secondary | ICD-10-CM | POA: Diagnosis not present

## 2017-06-16 DIAGNOSIS — I10 Essential (primary) hypertension: Secondary | ICD-10-CM | POA: Diagnosis not present

## 2017-06-16 DIAGNOSIS — G4733 Obstructive sleep apnea (adult) (pediatric): Secondary | ICD-10-CM | POA: Diagnosis not present

## 2017-06-16 NOTE — Addendum Note (Signed)
Addended by: Austin Miles on: 06/16/2017 09:00 AM   Modules accepted: Orders

## 2017-06-16 NOTE — Patient Instructions (Signed)
Medication Instructions:  Your physician recommends that you continue on your current medications as directed. Please refer to the Current Medication list given to you today.   Labwork: Your physician recommends that you return for lab work today: lipid panel, hepatic function panel, BMP.   Testing/Procedures: None  Follow-Up: Your physician wants you to follow-up in: 3 months. You will receive a reminder letter in the mail two months in advance. If you don't receive a letter, please call our office to schedule the follow-up appointment.   Any Other Special Instructions Will Be Listed Below (If Applicable).     If you need a refill on your cardiac medications before your next appointment, please call your pharmacy.

## 2017-06-16 NOTE — Progress Notes (Signed)
Cardiology Office Note:    Date:  06/16/2017   ID:  Jeremy Fox, DOB 02/23/1946, MRN [id number]  PCP:  Daphene Calamity, MD  Cardiologist:  Jenne Campus, MD    Referring MD: Daphene Calamity,*   Chief Complaint  Patient presents with  . Follow-up  Doing well  History of Present Illness:    Jeremy Fox is a 72 y.o. male with hypertension obstructive sleep apnea.  He is doing well very active to have no difficulty doing things exercise on the regular basis problem that he got his pain in his knees but he tried to push himself while exercising.  No chest pain tightness squeezing pressure burning chest.  He does use CPAP mask versus sleep apnea and very happy with it.  Disappointed with the fact that his blood pressure still not well controlled we had a long discussion about his blood pressure today and he agreed to go on extra medication however before we make a choice I will check his Chem-7 I am thinking about potentially putting him on Aldactone.  Past Medical History:  Diagnosis Date  . Dyslipidemia   . Essential hypertension   . 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 OA (osteoarthritis) of knee 06/03/2015  . OSA (obstructive sleep apnea)   . Rotator cuff syndrome of left shoulder 06/03/2015  . 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 amLODipine-benazepril (LOTREL) 10-40 MG capsule Take 1 capsule by mouth daily.  . B Complex-C (B-COMPLEX WITH VITAMIN C) tablet Take 1 tablet by mouth daily.  . Multiple Vitamin (MULTIVITAMIN) capsule Take by mouth.  . Pyridoxine HCl (B-6 PO) Take by mouth daily.     Allergies:   Sulfa antibiotics and Sulfamethoxazole   Social History   Socioeconomic History  . Marital status: Single    Spouse name: Not on file  . Number of children: Not on file  . Years of education: Not on file  . Highest education level: Not on file  Occupational History    . Not on file  Social Needs  . Financial resource strain: Not on file  . Food insecurity:    Worry: Not on file    Inability: Not on file  . Transportation needs:    Medical: Not on file    Non-medical: Not on file  Tobacco Use  . Smoking status: Never Smoker  . Smokeless tobacco: Never Used  Substance and Sexual Activity  . Alcohol use: Yes  . Drug use: No  . Sexual activity: Not on file  Lifestyle  . Physical activity:    Days per week: Not on file    Minutes per session: Not on file  . Stress: Not on file  Relationships  . Social connections:    Talks on phone: Not on file    Gets together: Not on file    Attends religious service: Not on file    Active member of club or organization: Not on file    Attends meetings of clubs or organizations: Not on file    Relationship status: Not on file  Other Topics Concern  . Not on file  Social History Narrative  . Not on file     Family History: The patient's family history includes Heart attack in his father; Heart disease in his father. ROS:   Please see the history of present illness.    All 14 point review of systems negative except as  described per history of present illness  EKGs/Labs/Other Studies Reviewed:      Recent Labs: No results found for requested labs within last 8760 hours.  Recent Lipid Panel    Component Value Date/Time   CHOL 193 03/18/2017 1012   TRIG 75 03/18/2017 1012   HDL 51 03/18/2017 1012   CHOLHDL 3.8 03/18/2017 1012   LDLCALC 127 (H) 03/18/2017 1012    Physical Exam:    VS:  BP (!) 160/60   Pulse 71   Ht 6' (1.829 m)   Wt 223 lb 12.8 oz (101.5 kg)   SpO2 98%   BMI 30.35 kg/m     Wt Readings from Last 3 Encounters:  06/16/17 223 lb 12.8 oz (101.5 kg)  03/18/17 234 lb (106.1 kg)  10/12/16 229 lb 9.6 oz (104.1 kg)     GEN:  Well nourished, well developed in no acute distress HEENT: Normal NECK: No JVD; No carotid bruits LYMPHATICS: No lymphadenopathy CARDIAC: RRR, no  murmurs, no rubs, no gallops RESPIRATORY:  Clear to auscultation without rales, wheezing or rhonchi  ABDOMEN: Soft, non-tender, non-distended MUSCULOSKELETAL:  No edema; No deformity  SKIN: Warm and dry LOWER EXTREMITIES: no swelling NEUROLOGIC:  Alert and oriented x 3 PSYCHIATRIC:  Normal affect   ASSESSMENT:    1. Essential hypertension   2. OSA (obstructive sleep apnea)   3. Dyslipidemia    PLAN:    In order of problems listed above:  1. Essential hypertension: We will get Chem-7 to decide about choice of medication. 2. Obstructive sleep apnea: He does use mask on the regular basis he is very happy with the results of it 3. Dyslipidemia will do his fasting lipid profile today with liver function test   Medication Adjustments/Labs and Tests Ordered: Current medicines are reviewed at length with the patient today.  Concerns regarding medicines are outlined above.  No orders of the defined types were placed in this encounter.  Medication changes: No orders of the defined types were placed in this encounter.   Signed, Park Liter, MD, [facility] 06/16/2017 8:50 AM    [place]

## 2017-06-17 ENCOUNTER — Telehealth: Payer: Self-pay | Admitting: *Deleted

## 2017-06-17 LAB — BASIC METABOLIC PANEL
BUN/Creatinine Ratio: 20 (ref 10–24)
BUN: 21 mg/dL (ref 8–27)
CALCIUM: 9 mg/dL (ref 8.6–10.2)
CHLORIDE: 102 mmol/L (ref 96–106)
CO2: 25 mmol/L (ref 20–29)
Creatinine, Ser: 1.07 mg/dL (ref 0.76–1.27)
GFR calc Af Amer: 79 mL/min/{1.73_m2} (ref >59)
GFR, EST NON AFRICAN AMERICAN: 68 mL/min/{1.73_m2} (ref >59)
Glucose: 88 mg/dL (ref 65–99)
POTASSIUM: 4.4 mmol/L (ref 3.5–5.2)
Sodium: 143 mmol/L (ref 134–144)

## 2017-06-17 LAB — HEPATIC FUNCTION PANEL
ALK PHOS: 107 IU/L (ref 39–117)
ALT: 24 IU/L (ref 0–44)
AST: 21 IU/L (ref 0–40)
Albumin: 4.4 g/dL (ref 3.5–4.8)
BILIRUBIN, DIRECT: 0.15 mg/dL (ref 0.00–0.40)
Bilirubin Total: 0.7 mg/dL (ref 0.0–1.2)
TOTAL PROTEIN: 7.2 g/dL (ref 6.0–8.5)

## 2017-06-17 LAB — LIPID PANEL
CHOLESTEROL TOTAL: 188 mg/dL (ref 100–199)
Chol/HDL Ratio: 3.5 ratio (ref 0.0–5.0)
HDL: 53 mg/dL (ref >39)
LDL Calculated: 121 mg/dL — ABNORMAL HIGH (ref 0–99)
Triglycerides: 70 mg/dL (ref 0–149)
VLDL Cholesterol Cal: 14 mg/dL (ref 5–40)

## 2017-06-17 NOTE — Telephone Encounter (Signed)
-----   Message from Park Liter, MD sent at 06/17/2017  3:46 PM EDT ----- Start aldactone 2.5 mg po qd  chem7 in 1 week

## 2017-06-17 NOTE — Telephone Encounter (Signed)
Left message to return call about lab results and adding new medication to regimen.

## 2017-06-20 ENCOUNTER — Telehealth: Payer: Self-pay | Admitting: *Deleted

## 2017-06-20 MED ORDER — SPIRONOLACTONE 25 MG PO TABS: 12.5000 mg | ORAL_TABLET | Freq: Every day | ORAL | 3 refills | Status: DC

## 2017-06-20 NOTE — Telephone Encounter (Signed)
-----   Message from Park Liter, MD sent at 06/17/2017  3:46 PM EDT ----- Start aldactone 2.5 mg po qd  chem7 in 1 week

## 2017-06-20 NOTE — Telephone Encounter (Signed)
Patient informed of results. Advised to start new medication called aldactone 12.5 mg daily to help better control his blood pressure. Patient verbalized understanding. No further questions. Prescription sent into [facility].

## 2017-06-30 DIAGNOSIS — Z8659 Personal history of other mental and behavioral disorders: Secondary | ICD-10-CM | POA: Diagnosis not present

## 2017-06-30 DIAGNOSIS — I10 Essential (primary) hypertension: Secondary | ICD-10-CM | POA: Diagnosis not present

## 2017-06-30 DIAGNOSIS — G4733 Obstructive sleep apnea (adult) (pediatric): Secondary | ICD-10-CM | POA: Diagnosis not present

## 2017-06-30 DIAGNOSIS — F446 Conversion disorder with sensory symptom or deficit: Secondary | ICD-10-CM | POA: Diagnosis not present

## 2017-07-30 DIAGNOSIS — I10 Essential (primary) hypertension: Secondary | ICD-10-CM | POA: Diagnosis not present

## 2017-07-30 DIAGNOSIS — G4733 Obstructive sleep apnea (adult) (pediatric): Secondary | ICD-10-CM | POA: Diagnosis not present

## 2017-07-30 DIAGNOSIS — Z8659 Personal history of other mental and behavioral disorders: Secondary | ICD-10-CM | POA: Diagnosis not present

## 2017-07-30 DIAGNOSIS — F446 Conversion disorder with sensory symptom or deficit: Secondary | ICD-10-CM | POA: Diagnosis not present

## 2017-09-23 ENCOUNTER — Other Ambulatory Visit: Payer: Self-pay

## 2017-09-23 MED ORDER — AMLODIPINE BESY-BENAZEPRIL HCL 10-40 MG PO CAPS: 1.0000 | ORAL_CAPSULE | Freq: Every day | ORAL | 1 refills | Status: DC

## 2017-10-07 DIAGNOSIS — G4733 Obstructive sleep apnea (adult) (pediatric): Secondary | ICD-10-CM | POA: Diagnosis not present

## 2017-10-10 ENCOUNTER — Ambulatory Visit: Payer: PPO | Admitting: Cardiology

## 2017-10-10 ENCOUNTER — Encounter: Payer: Self-pay | Admitting: Cardiology

## 2017-10-10 VITALS — BP 160/86 | HR 63 | Ht 72.0 in | Wt 226.0 lb

## 2017-10-10 DIAGNOSIS — G4733 Obstructive sleep apnea (adult) (pediatric): Secondary | ICD-10-CM | POA: Diagnosis not present

## 2017-10-10 DIAGNOSIS — I10 Essential (primary) hypertension: Secondary | ICD-10-CM | POA: Diagnosis not present

## 2017-10-10 DIAGNOSIS — I119 Hypertensive heart 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: Secondary | ICD-10-CM | POA: Diagnosis not present

## 2017-10-10 DIAGNOSIS — E785 Hyperlipidemia, unspecified: Secondary | ICD-10-CM | POA: Diagnosis not present

## 2017-10-10 MED ORDER — CLONIDINE HCL 0.1 MG PO TABS: 0.1000 mg | ORAL_TABLET | Freq: Every day | ORAL | 2 refills | Status: DC

## 2017-10-10 NOTE — Addendum Note (Signed)
Addended by: Ashok Norris on: 10/10/2017 08:47 AM   Modules accepted: Orders

## 2017-10-10 NOTE — Progress Notes (Signed)
Cardiology Office Note:    Date:  10/10/2017   ID:  Jeremy Fox, DOB Dec 08, 1945, MRN [id number]  PCP:  Daphene Calamity, MD  Cardiologist:  Jenne Campus, MD    Referring MD: Daphene Calamity,*   Chief Complaint  Patient presents with  . Follow-up  Doing fine  History of Present Illness:    Jeremy Fox is a 72 y.o. male with essential hypertension, obstructive sleep apnea, dyslipidemia comes to our for follow-up he is upset today because his blood pressure measured today was 160/95.  Complain of having to need to urinate on Aldactone.  Denies having any chest pain tightness squeezing pressure burning chest.  Still busy in the garden working  Past Medical History:  Diagnosis Date  . Dyslipidemia   . Essential hypertension   . 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 OA (osteoarthritis) of knee 06/03/2015  . OSA (obstructive sleep apnea)   . Rotator cuff syndrome of left shoulder 06/03/2015  . 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 amLODipine-benazepril (LOTREL) 10-40 MG capsule Take 1 capsule by mouth daily.  . B Complex-C (B-COMPLEX WITH VITAMIN C) tablet Take 1 tablet by mouth daily.  . Multiple Vitamin (MULTIVITAMIN) capsule Take by mouth.  . Pyridoxine HCl (B-6 PO) Take by mouth daily.  Marland Kitchen spironolactone (ALDACTONE) 25 MG tablet Take 0.5 tablets (12.5 mg total) by mouth daily.     Allergies:   Sulfa antibiotics and Sulfamethoxazole   Social History   Socioeconomic History  . Marital status: Single    Spouse name: Not on file  . Number of children: Not on file  . Years of education: Not on file  . Highest education level: Not on file  Occupational History  . Not on file  Social Needs  . Financial resource strain: Not on file  . Food insecurity:    Worry: Not on file    Inability: Not on file  . Transportation needs:    Medical: Not on file    Non-medical: Not on  file  Tobacco Use  . Smoking status: Never Smoker  . Smokeless tobacco: Never Used  Substance and Sexual Activity  . Alcohol use: Yes  . Drug use: No  . Sexual activity: Not on file  Lifestyle  . Physical activity:    Days per week: Not on file    Minutes per session: Not on file  . Stress: Not on file  Relationships  . Social connections:    Talks on phone: Not on file    Gets together: Not on file    Attends religious service: Not on file    Active member of club or organization: Not on file    Attends meetings of clubs or organizations: Not on file    Relationship status: Not on file  Other Topics Concern  . Not on file  Social History Narrative  . Not on file     Family History: The patient's family history includes Heart attack in his father; Heart disease in his father. ROS:   Please see the history of present illness.    All 14 point review of systems negative except as described per history of present illness  EKGs/Labs/Other Studies Reviewed:      Recent Labs: 06/16/2017: ALT 24; BUN 21; Creatinine, Ser 1.07; Potassium 4.4; Sodium 143  Recent Lipid Panel    Component Value Date/Time   CHOL 188 06/16/2017 0910  TRIG 70 06/16/2017 0910   HDL 53 06/16/2017 0910   CHOLHDL 3.5 06/16/2017 0910   LDLCALC 121 (H) 06/16/2017 0910    Physical Exam:    VS:  BP (!) 160/86 (BP Location: Right Arm, Patient Position: Sitting, Cuff Size: Normal)   Pulse 63   Ht 6' (1.829 m)   Wt 226 lb (102.5 kg)   SpO2 98%   BMI 30.65 kg/m     Wt Readings from Last 3 Encounters:  10/10/17 226 lb (102.5 kg)  06/16/17 223 lb 12.8 oz (101.5 kg)  03/18/17 234 lb (106.1 kg)     GEN:  Well nourished, well developed in no acute distress HEENT: Normal NECK: No JVD; No carotid bruits LYMPHATICS: No lymphadenopathy CARDIAC: RRR, no murmurs, no rubs, no gallops RESPIRATORY:  Clear to auscultation without rales, wheezing or rhonchi  ABDOMEN: Soft, non-tender,  non-distended MUSCULOSKELETAL:  No edema; No deformity  SKIN: Warm and dry LOWER EXTREMITIES: no swelling NEUROLOGIC:  Alert and oriented x 3 PSYCHIATRIC:  Normal affect   ASSESSMENT:    1. Essential hypertension   2. Hypertensive heart 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   3. OSA (obstructive sleep apnea)   4. Dyslipidemia    PLAN:    In order of problems listed above:  1. Essential hypertension: I will stop his Aldactone I will put him on clonidine 0.1 mg daily.  Rest of the medications will be the same sleep apnea he uses CPAP mask and is very satisfied with the results of it. 2. Dyslipidemia: We will continue present management however     When I see him we will recheck his fasting lipid profile and decide about potentially adding statin.  Is always very difficult to put him on some new medication so I did not push this issue today I would like to get his blood pressure straight first.    Medication Adjustments/Labs and Tests Ordered: Current medicines are reviewed at length with the patient today.  Concerns regarding medicines are outlined above.  No orders of the defined types were placed in this encounter.  Medication changes: No orders of the defined types were placed in this encounter.   Signed, Park Liter, MD, [facility] 10/10/2017 8:35 AM    [place]

## 2017-10-10 NOTE — Patient Instructions (Addendum)
Medication Instructions:Your physician has recommended you make the following change in your medication:   STOP: Aldactone.  START: Clonidine 0.1 mg daily.      Labwork: None   Testing/Procedures: None       Follow-Up: Your physician recommends that you schedule a follow-up appointment in: 3 months   Any Other Special Instructions Will Be Listed Below (If Applicable).     If you need a refill on your cardiac medications before your next appointment, please call your pharmacy.  Clonidine tablets What is this medicine? CLONIDINE (KLOE ni deen) is used to treat high blood pressure. This medicine may be used for other purposes; ask your health care provider or pharmacist if you have questions. COMMON BRAND NAME(S): Catapres What should I tell my health care provider before I take this medicine? They need to know if you have any of these conditions: -kidney disease -an unusual or allergic reaction to clonidine, other medicines, foods, dyes, or preservatives -pregnant or trying to get pregnant -breast-feeding How should I use this medicine? Take this medicine by mouth with a glass of water. Follow the directions on the prescription label. Take your doses at regular intervals. Do not take your medicine more often than directed. Do not suddenly stop taking this medicine. You must gradually reduce the dose or you may get a dangerous increase in blood pressure. Ask your doctor or health care professional for advice. Talk to your pediatrician regarding the use of this medicine in children. Special care may be needed. Overdosage: If you think you have taken too much of this medicine contact a poison control center or emergency room at once. NOTE: This medicine is only for you. Do not share this medicine with others. What if I miss a dose? If you miss a dose, take it as soon as you can. If it is almost time for your next dose, take only that dose. Do not take double or extra  doses. What may interact with this medicine? Do not take this medicine with any of the following medications: -MAOIs like Carbex, Eldepryl, Marplan, Nardil, and Parnate This medicine may also interact with the following medications: -barbiturate medicines for inducing sleep or treating seizures like phenobarbital -certain medicines for blood pressure, heart disease, irregular heart beat -certain medicines for depression, anxiety, or psychotic disturbances -prescription pain medicines This list may not describe all possible interactions. Give your health care provider a list of all the medicines, herbs, non-prescription drugs, or dietary supplements you use. Also tell them if you smoke, drink alcohol, or use illegal drugs. Some items may interact with your medicine. What should I watch for while using this medicine? Visit your doctor or health care professional for regular checks on your progress. Check your heart rate and blood pressure regularly while you are taking this medicine. Ask your doctor or health care professional what your heart rate should be and when you should contact him or her. You may get drowsy or dizzy. Do not drive, use machinery, or do anything that needs mental alertness until you know how this medicine affects you. To avoid dizzy or fainting spells, do not stand or sit up quickly, especially if you are an older person. Alcohol can make you more drowsy and dizzy. Avoid alcoholic drinks. Your mouth may get dry. Chewing sugarless gum or sucking hard candy, and drinking plenty of water will help. Do not treat yourself for coughs, colds, or pain while you are taking this medicine without asking your doctor or health  care professional for advice. Some ingredients may increase your blood pressure. If you are going to have surgery tell your doctor or health care professional that you are taking this medicine. What side effects may I notice from receiving this medicine? Side effects  that you should report to your doctor or health care professional as soon as possible: -allergic reactions like skin rash, itching or hives, swelling of the face, lips, or tongue -anxiety, nervousness -chest pain -depression -fast, irregular heartbeat -swelling of feet or legs -unusually weak or tired Side effects that usually do not require medical attention (report to your doctor or health care professional if they continue or are bothersome): -change in sex drive or performance -constipation -headache This list may not describe all possible side effects. Call your doctor for medical advice about side effects. You may report side effects to FDA at 1-800-FDA-1088. Where should I keep my medicine? Keep out of the reach of children. Store at room temperature between 15 and 30 degrees C (59 and 86 degrees F). Protect from light. Keep container tightly closed. Throw away any unused medicine after the expiration date. NOTE: This sheet is a summary. It may not cover all possible information. If you have questions about this medicine, talk to your doctor, pharmacist, or health care provider.  2018 Elsevier/Gold Standard (2010-08-19 13:01:28)

## 2017-11-03 DIAGNOSIS — H26491 Other secondary cataract, right eye: Secondary | ICD-10-CM | POA: Diagnosis not present

## 2018-02-27 ENCOUNTER — Ambulatory Visit: Payer: Self-pay | Admitting: Cardiology

## 2018-03-15 ENCOUNTER — Ambulatory Visit: Payer: Self-pay | Admitting: Cardiology

## 2018-03-27 ENCOUNTER — Other Ambulatory Visit: Payer: Self-pay | Admitting: Cardiology

## 2018-04-14 ENCOUNTER — Ambulatory Visit (INDEPENDENT_AMBULATORY_CARE_PROVIDER_SITE_OTHER): Payer: PPO | Admitting: Cardiology

## 2018-04-14 ENCOUNTER — Encounter: Payer: Self-pay | Admitting: Cardiology

## 2018-04-14 VITALS — BP 146/64 | HR 76 | Ht 72.0 in | Wt 232.8 lb

## 2018-04-14 DIAGNOSIS — E785 Hyperlipidemia, unspecified: Secondary | ICD-10-CM

## 2018-04-14 DIAGNOSIS — I10 Essential (primary) hypertension: Secondary | ICD-10-CM | POA: Diagnosis not present

## 2018-04-14 DIAGNOSIS — G4733 Obstructive sleep apnea (adult) (pediatric): Secondary | ICD-10-CM

## 2018-04-14 LAB — HEPATIC FUNCTION PANEL
ALK PHOS: 74 IU/L (ref 39–117)
ALT: 24 IU/L (ref 0–44)
AST: 26 IU/L (ref 0–40)
Albumin: 4.4 g/dL (ref 3.7–4.7)
BILIRUBIN, DIRECT: 0.2 mg/dL (ref 0.00–0.40)
Bilirubin Total: 0.8 mg/dL (ref 0.0–1.2)
TOTAL PROTEIN: 6.4 g/dL (ref 6.0–8.5)

## 2018-04-14 LAB — LIPID PANEL
CHOL/HDL RATIO: 3.6 ratio (ref 0.0–5.0)
Cholesterol, Total: 176 mg/dL (ref 100–199)
HDL: 49 mg/dL (ref >39)
LDL CALC: 116 mg/dL — AB (ref 0–99)
Triglycerides: 54 mg/dL (ref 0–149)
VLDL Cholesterol Cal: 11 mg/dL (ref 5–40)

## 2018-04-14 MED ORDER — CLONIDINE HCL 0.2 MG PO TABS: 0.2000 mg | ORAL_TABLET | Freq: Every day | ORAL | 1 refills | Status: DC

## 2018-04-14 MED ORDER — CLONIDINE HCL 0.2 MG PO TABS: 0.1000 mg | ORAL_TABLET | Freq: Every day | ORAL | 1 refills | Status: DC

## 2018-04-14 NOTE — Progress Notes (Signed)
Cardiology Office Note:    Date:  04/14/2018   ID:  Jeremy Fox, DOB [birth year], MRN [id number]  PCP:  Patient, No Pcp Per  Cardiologist:  Jenne Campus, MD    Referring MD: Daphene Calamity,*   Chief Complaint  Patient presents with  . Follow-up  Doing well but upset about her blood pressure  History of Present Illness:    Jeremy Fox is a 73 y.o. male with hypertension comes today to my office for follow-up his blood pressure appears to be still elevated he brought blood pressure measurements there is at least 40 of 50 measurements all elevated.  Clearly he needs more medication to bring it down he does have obstructive sleep apnea he use CPAP mask on a regular basis and is very happy and satisfied with the effect of it.  Does have any chest pain tightness squeezing pressure been chest no shortness of breath no swelling of lower extremities.  Past Medical History:  Diagnosis Date  . Dyslipidemia   . Essential hypertension   . 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 OA (osteoarthritis) of knee 06/03/2015  . OSA (obstructive sleep apnea)   . Rotator cuff syndrome of left shoulder 06/03/2015  . 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 amLODipine-benazepril (LOTREL) 10-40 MG capsule TAKE ONE CAPSULE BY MOUTH DAILY  . B Complex-C (B-COMPLEX WITH VITAMIN C) tablet Take 1 tablet by mouth daily.  . cloNIDine (CATAPRES) 0.1 MG tablet Take 1 tablet (0.1 mg total) by mouth daily.  . Multiple Vitamin (MULTIVITAMIN) capsule Take by mouth.  . Pyridoxine HCl (B-6 PO) Take by mouth daily.     Allergies:   Sulfa antibiotics and Sulfamethoxazole   Social History   Socioeconomic History  . Marital status: Single    Spouse name: Not on file  . Number of children: Not on file  . Years of education: Not on file  . Highest education level: Not on file  Occupational History  . Not on file  Social Needs   . Financial resource strain: Not on file  . Food insecurity:    Worry: Not on file    Inability: Not on file  . Transportation needs:    Medical: Not on file    Non-medical: Not on file  Tobacco Use  . Smoking status: Never Smoker  . Smokeless tobacco: Never Used  Substance and Sexual Activity  . Alcohol use: Yes  . Drug use: No  . Sexual activity: Not on file  Lifestyle  . Physical activity:    Days per week: Not on file    Minutes per session: Not on file  . Stress: Not on file  Relationships  . Social connections:    Talks on phone: Not on file    Gets together: Not on file    Attends religious service: Not on file    Active member of club or organization: Not on file    Attends meetings of clubs or organizations: Not on file    Relationship status: Not on file  Other Topics Concern  . Not on file  Social History Narrative  . Not on file     Family History: The patient's family history includes Heart attack in his father; Heart disease in his father. ROS:   Please see the history of present illness.    All 14 point review of systems negative except as described per history of present  illness  EKGs/Labs/Other Studies Reviewed:      Recent Labs: 06/16/2017: ALT 24; BUN 21; Creatinine, Ser 1.07; Potassium 4.4; Sodium 143  Recent Lipid Panel    Component Value Date/Time   CHOL 188 06/16/2017 0910   TRIG 70 06/16/2017 0910   HDL 53 06/16/2017 0910   CHOLHDL 3.5 06/16/2017 0910   LDLCALC 121 (H) 06/16/2017 0910    Physical Exam:    VS:  BP (!) 146/64   Pulse 76   Ht 6' (1.829 m)   Wt 232 lb 12.8 oz (105.6 kg)   SpO2 98%   BMI 31.57 kg/m     Wt Readings from Last 3 Encounters:  04/14/18 232 lb 12.8 oz (105.6 kg)  10/10/17 226 lb (102.5 kg)  06/16/17 223 lb 12.8 oz (101.5 kg)     GEN:  Well nourished, well developed in no acute distress HEENT: Normal NECK: No JVD; No carotid bruits LYMPHATICS: No lymphadenopathy CARDIAC: RRR, no murmurs, no rubs,  no gallops RESPIRATORY:  Clear to auscultation without rales, wheezing or rhonchi  ABDOMEN: Soft, non-tender, non-distended MUSCULOSKELETAL:  No edema; No deformity  SKIN: Warm and dry LOWER EXTREMITIES: no swelling NEUROLOGIC:  Alert and oriented x 3 PSYCHIATRIC:  Normal affect   ASSESSMENT:    1. Essential hypertension   2. Dyslipidemia   3. OSA (obstructive sleep apnea)    PLAN:    In order of problems listed above:  1. Essential hypertension we will double the dose of Catapres he takes 0.  1, will increase to 2.2 mg daily.  I asked him to keep checking blood pressure and bring results to me in about 3 weeks.  Also asked him to bring his blood pressure monitor so we can verify the accuracy of this device. 2. Dyslipidemia fasting lipid profile will be done today. 3. Obstructive sleep apnea he uses CPAP mask on a regular basis which we will continue.   Medication Adjustments/Labs and Tests Ordered: Current medicines are reviewed at length with the patient today.  Concerns regarding medicines are outlined above.  No orders of the defined types were placed in this encounter.  Medication changes: No orders of the defined types were placed in this encounter.   Signed, Park Liter, MD, [facility] 04/14/2018 8:48 AM    [place]

## 2018-04-14 NOTE — Patient Instructions (Signed)
Medication Instructions:  Your physician has recommended you make the following change in your medication:  Increase: Catapres to 0.2 mg daily  If you need a refill on your cardiac medications before your next appointment, please call your pharmacy.   Lab work: Your physician recommends that you return for lab work today: Lft, Lipids  If you have labs (blood work) drawn today and your tests are completely normal, you will receive your results only by: Marland Kitchen MyChart Message (if you have MyChart) OR . A paper copy in the mail If you have any lab test that is abnormal or we need to change your treatment, we will call you to review the results.  Testing/Procedures: None.   Follow-Up: At [facility], you and your health needs are our priority.  As part of our continuing mission to provide you with exceptional heart care, we have created designated Provider Care Teams.  These Care Teams include your primary Cardiologist (physician) and Advanced Practice Providers (APPs -  Physician Assistants and Nurse Practitioners) who all work together to provide you with the care you need, when you need it. You will need a follow up appointment in 3 months.  Please call our office 2 months in advance to schedule this appointment.  You may see Jenne Campus, MD or another member of our [place] Provider Team in [place]: Shirlee More, MD . Jyl Heinz, MD  Any Other Special Instructions Will Be Listed Below (If Applicable).  Clonidine tablets What is this medicine? CLONIDINE (KLOE ni deen) is used to treat high blood pressure. This medicine may be used for other purposes; ask your health care provider or pharmacist if you have questions. COMMON BRAND NAME(S): Catapres What should I tell my health care provider before I take this medicine? They need to know if you have any of these conditions: -kidney disease -an unusual or allergic reaction to clonidine, other medicines, foods, dyes, or  preservatives -pregnant or trying to get pregnant -breast-feeding How should I use this medicine? Take this medicine by mouth with a glass of water. Follow the directions on the prescription label. Take your doses at regular intervals. Do not take your medicine more often than directed. Do not suddenly stop taking this medicine. You must gradually reduce the dose or you may get a dangerous increase in blood pressure. Ask your doctor or health care professional for advice. Talk to your pediatrician regarding the use of this medicine in children. Special care may be needed. Overdosage: If you think you have taken too much of this medicine contact a poison control center or emergency room at once. NOTE: This medicine is only for you. Do not share this medicine with others. What if I miss a dose? If you miss a dose, take it as soon as you can. If it is almost time for your next dose, take only that dose. Do not take double or extra doses. What may interact with this medicine? Do not take this medicine with any of the following medications: -MAOIs like Carbex, Eldepryl, Marplan, Nardil, and Parnate This medicine may also interact with the following medications: -barbiturate medicines for inducing sleep or treating seizures like phenobarbital -certain medicines for blood pressure, heart disease, irregular heart beat -certain medicines for depression, anxiety, or psychotic disturbances -prescription pain medicines This list may not describe all possible interactions. Give your health care provider a list of all the medicines, herbs, non-prescription drugs, or dietary supplements you use. Also tell them if you smoke, drink alcohol,  or use illegal drugs. Some items may interact with your medicine. What should I watch for while using this medicine? Visit your doctor or health care professional for regular checks on your progress. Check your heart rate and blood pressure regularly while you are taking this  medicine. Ask your doctor or health care professional what your heart rate should be and when you should contact him or her. You may get drowsy or dizzy. Do not drive, use machinery, or do anything that needs mental alertness until you know how this medicine affects you. To avoid dizzy or fainting spells, do not stand or sit up quickly, especially if you are an older person. Alcohol can make you more drowsy and dizzy. Avoid alcoholic drinks. Your mouth may get dry. Chewing sugarless gum or sucking hard candy, and drinking plenty of water will help. Do not treat yourself for coughs, colds, or pain while you are taking this medicine without asking your doctor or health care professional for advice. Some ingredients may increase your blood pressure. If you are going to have surgery tell your doctor or health care professional that you are taking this medicine. What side effects may I notice from receiving this medicine? Side effects that you should report to your doctor or health care professional as soon as possible: -allergic reactions like skin rash, itching or hives, swelling of the face, lips, or tongue -anxiety, nervousness -chest pain -depression -fast, irregular heartbeat -swelling of feet or legs -unusually weak or tired Side effects that usually do not require medical attention (report to your doctor or health care professional if they continue or are bothersome): -change in sex drive or performance -constipation -headache This list may not describe all possible side effects. Call your doctor for medical advice about side effects. You may report side effects to FDA at 1-800-FDA-1088. Where should I keep my medicine? Keep out of the reach of children. Store at room temperature between 15 and 30 degrees C (59 and 86 degrees F). Protect from light. Keep container tightly closed. Throw away any unused medicine after the expiration date. NOTE: This sheet is a summary. It may not cover all  possible information. If you have questions about this medicine, talk to your doctor, pharmacist, or health care provider.  2019 Elsevier/Gold Standard (2010-08-19 13:01:28)

## 2018-04-18 ENCOUNTER — Telehealth: Payer: Self-pay | Admitting: Emergency Medicine

## 2018-04-18 ENCOUNTER — Telehealth: Payer: Self-pay | Admitting: Cardiology

## 2018-04-18 DIAGNOSIS — E785 Hyperlipidemia, unspecified: Secondary | ICD-10-CM

## 2018-04-18 NOTE — Telephone Encounter (Signed)
Patient informed of results. Advised him of Dr. Wendy Poet recommendation to start lipitor. Patient refused. He would like to control this with diet. I will inform Dr. Agustin Cree. Patient does agree to return in 6 weeks to have labs rechecked after making diet changes.

## 2018-04-18 NOTE — Telephone Encounter (Signed)
Please see additional phone encounter.

## 2018-04-18 NOTE — Telephone Encounter (Signed)
Returned call

## 2018-04-26 DIAGNOSIS — G4733 Obstructive sleep apnea (adult) (pediatric): Secondary | ICD-10-CM | POA: Diagnosis not present

## 2018-05-01 DIAGNOSIS — E785 Hyperlipidemia, unspecified: Secondary | ICD-10-CM | POA: Diagnosis not present

## 2018-05-01 LAB — HEPATIC FUNCTION PANEL
ALT: 24 IU/L (ref 0–44)
AST: 20 IU/L (ref 0–40)
Albumin: 4.6 g/dL (ref 3.7–4.7)
Alkaline Phosphatase: 89 IU/L (ref 39–117)
Bilirubin Total: 0.8 mg/dL (ref 0.0–1.2)
Bilirubin, Direct: 0.19 mg/dL (ref 0.00–0.40)
Total Protein: 6.8 g/dL (ref 6.0–8.5)

## 2018-05-01 LAB — LIPID PANEL
Chol/HDL Ratio: 3.5 ratio (ref 0.0–5.0)
Cholesterol, Total: 173 mg/dL (ref 100–199)
HDL: 49 mg/dL (ref >39)
LDL CALC: 109 mg/dL — AB (ref 0–99)
Triglycerides: 73 mg/dL (ref 0–149)
VLDL Cholesterol Cal: 15 mg/dL (ref 5–40)

## 2018-06-30 ENCOUNTER — Other Ambulatory Visit: Payer: Self-pay | Admitting: Cardiology

## 2018-06-30 NOTE — Telephone Encounter (Signed)
Rx refill sent to pharmacy. 

## 2018-09-04 ENCOUNTER — Encounter: Payer: Self-pay | Admitting: Nurse Practitioner

## 2018-09-05 ENCOUNTER — Ambulatory Visit (INDEPENDENT_AMBULATORY_CARE_PROVIDER_SITE_OTHER): Payer: PPO | Admitting: Nurse Practitioner

## 2018-09-05 ENCOUNTER — Other Ambulatory Visit: Payer: Self-pay

## 2018-09-05 ENCOUNTER — Encounter: Payer: Self-pay | Admitting: Nurse Practitioner

## 2018-09-05 VITALS — BP 130/82 | HR 65 | Temp 97.7°F | Ht 72.0 in | Wt 231.0 lb

## 2018-09-05 DIAGNOSIS — R972 Elevated prostate specific antigen [PSA]: Secondary | ICD-10-CM | POA: Insufficient documentation

## 2018-09-05 DIAGNOSIS — R350 Frequency of micturition: Secondary | ICD-10-CM | POA: Diagnosis not present

## 2018-09-05 DIAGNOSIS — M170 Bilateral primary osteoarthritis of knee: Secondary | ICD-10-CM | POA: Diagnosis not present

## 2018-09-05 DIAGNOSIS — I10 Essential (primary) hypertension: Secondary | ICD-10-CM

## 2018-09-05 DIAGNOSIS — R413 Other amnesia: Secondary | ICD-10-CM | POA: Diagnosis not present

## 2018-09-05 DIAGNOSIS — G4733 Obstructive sleep apnea (adult) (pediatric): Secondary | ICD-10-CM | POA: Diagnosis not present

## 2018-09-05 DIAGNOSIS — E785 Hyperlipidemia, unspecified: Secondary | ICD-10-CM | POA: Diagnosis not present

## 2018-09-05 DIAGNOSIS — N401 Enlarged prostate with lower urinary tract symptoms: Secondary | ICD-10-CM | POA: Insufficient documentation

## 2018-09-05 MED ORDER — AMLODIPINE BESY-BENAZEPRIL HCL 10-40 MG PO CAPS: 1.0000 | ORAL_CAPSULE | Freq: Every day | ORAL | 1 refills | Status: DC

## 2018-09-05 MED ORDER — CLONIDINE HCL 0.2 MG PO TABS: 0.2000 mg | ORAL_TABLET | Freq: Every day | ORAL | 1 refills | Status: DC

## 2018-09-05 NOTE — Progress Notes (Signed)
Careteam: Patient Care Team: Lauree Chandler, NP as PCP - General (Geriatric Medicine) Park Liter, MD as PCP - Cardiology (Cardiology) Luberta Mutter, MD as Consulting Physician (Ophthalmology)  Advanced Directive information Does Patient Have a Medical Advance Directive?: Yes, Type of Advance Directive: [place];Living will  Allergies  Allergen Reactions  . Sulfa Antibiotics Rash  . Sulfamethoxazole Rash    Chief Complaint  Patient presents with  . Establish Care    New patient establish care. Patient c/o bilateral knee weakness. Patient would like to discuss CPAP     HPI: Patient is a 73 y.o. male seen in the office today to establish care. Previous PCP moved practices.   Hypertension- amlodipine- benazepril 10-40 mg daily, clonidine 0.2 mg daily, following with cardiology  Osteoarthritis- in both knees, uses glucosamine-chondroit supplement Exercising 6 days a week with treadmill and weights. Does exercise with neighbor.   Hyperlipidemia- diet controlled.   BPH- wondering if he could shrink prostate with water. No longer following with urologist, saw one in [place].  Has hx of elevated PSA, sample taken to rule out cancer.   OSA- [place] cardiology following CPAP  Review of Systems:  Review of Systems  Constitutional: Negative for chills and fever.  HENT: Positive for hearing loss.   Eyes:       Hx of cataracts, have been removed  Respiratory: Negative for sputum production and shortness of breath.   Cardiovascular: Negative for chest pain and leg swelling.  Gastrointestinal: Negative for abdominal pain, constipation, diarrhea and heartburn.  Genitourinary: Positive for frequency.       Getting up several times at night to urinate, has tired several medications that were not effective.   Musculoskeletal: Positive for joint pain (bilateral knees).  Neurological: Negative for dizziness, weakness and headaches.   Psychiatric/Behavioral: Positive for memory loss. Negative for depression. The patient is not nervous/anxious and does not have insomnia.     Past Medical History:  Diagnosis Date  . Benign prostatic hyperplasia    Per records received from [facility], also in care everywhere  . Dyslipidemia   . Elevated PSA    Per records received from [facility], also in care everywhere  . Essential hypertension   . 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 OA (osteoarthritis) of knee 06/03/2015  . OSA (obstructive sleep apnea)   . Rotator cuff syndrome of left shoulder 06/03/2015  . 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 KNEE SURGERY     Social History:   reports that he has never smoked. He has never used smokeless tobacco. He reports that he does not drink alcohol or use drugs.  Family History  Problem Relation Age of Onset  . Heart attack Father   . Heart disease Father     Medications: Patient's Medications  New Prescriptions   No medications on file  Previous Medications   AMLODIPINE-BENAZEPRIL (LOTREL) 10-40 MG CAPSULE    TAKE ONE CAPSULE BY MOUTH DAILY   CHONDROITIN SULFATE PO    Take 1,200 mg by mouth daily.   CLONIDINE (CATAPRES) 0.2 MG TABLET    Take 1 tablet (0.2 mg total) by mouth daily.   GLUCOSAMINE-CHONDROIT-VIT C-MN (GLUCOSAMINE 1500 COMPLEX PO)    Take 1 capsule by mouth daily.   MULTIPLE VITAMIN (MULTIVITAMIN) CAPSULE    Take by mouth.  Modified Medications   No medications on file  Discontinued Medications   No medications  on file    Physical Exam:  Vitals:   09/05/18 1325  BP: 130/82  Pulse: 65  Temp: 97.7 F (36.5 C)  TempSrc: Oral  SpO2: 98%  Weight: 231 lb (104.8 kg)  Height: 6' (1.829 m)   Body mass index is 31.33 kg/m. Wt Readings from Last 3 Encounters:  09/05/18 231 lb (104.8 kg)  04/14/18 232 lb 12.8 oz (105.6 kg)  10/10/17 226 lb (102.5 kg)    Physical Exam  Constitutional:      General: He is not in acute distress.    Appearance: He is well-developed. He is not diaphoretic.  HENT:     Head: Normocephalic and atraumatic.     Mouth/Throat:     Pharynx: No oropharyngeal exudate.  Eyes:     Conjunctiva/sclera: Conjunctivae normal.     Pupils: Pupils are equal, round, and reactive to light.  Neck:     Musculoskeletal: Normal range of motion and neck supple.  Cardiovascular:     Rate and Rhythm: Normal rate and regular rhythm.     Heart sounds: Normal heart sounds.  Pulmonary:     Effort: Pulmonary effort is normal.     Breath sounds: Normal breath sounds.  Abdominal:     General: Bowel sounds are normal.     Palpations: Abdomen is soft.  Musculoskeletal:        General: No tenderness.  Skin:    General: Skin is warm and dry.  Neurological:     Mental Status: He is alert and oriented to person, place, and time.   * Labs reviewed: Basic Metabolic Panel: No results for input(s): NA, K, CL, CO2, GLUCOSE, BUN, CREATININE, CALCIUM, MG, PHOS, TSH in the last 8760 hours. Liver Function Tests: Recent Labs    04/14/18 0941 05/01/18 0803  AST 26 20  ALT 24 24  ALKPHOS 74 89  BILITOT 0.8 0.8  PROT 6.4 6.8  ALBUMIN 4.4 4.6   No results for input(s): LIPASE, AMYLASE in the last 8760 hours. No results for input(s): AMMONIA in the last 8760 hours. CBC: No results for input(s): WBC, NEUTROABS, HGB, HCT, MCV, PLT in the last 8760 hours. Lipid Panel: Recent Labs    04/14/18 0941 05/01/18 0803  CHOL 176 173  HDL 49 49  LDLCALC 116* 109*  TRIG 54 73  CHOLHDL 3.6 3.5   TSH: No results for input(s): TSH in the last 8760 hours. A1C: No results found for: HGBA1C   Assessment/Plan 1. Primary osteoarthritis of both knees Continue to exercise and to use glucosamine supplements which have been effective  2. Memory loss -pt reports he sometimes has a hard time remembering. MMSE 28/30, missed recall. Will follow up labs at this time.   - Vitamin B12 - TSH - CBC with Differential/Platelet - CMP with eGFR([facility])  3. Essential hypertension -stable, encouraged dietary compliance with medication.  - amLODipine-benazepril (LOTREL) 10-40 MG capsule; Take 1 capsule by mouth daily.  Dispense: 90 capsule; Refill: 1 - cloNIDine (CATAPRES) 0.2 MG tablet; Take 1 tablet (0.2 mg total) by mouth daily.  Dispense: 90 tablet; Refill: 1  4. Dyslipidemia -diet controlled. LDL 109 in febuary   5. OSA (obstructive sleep apnea) Continues on CPAP, followed by cardiology  6. Benign prostatic hyperplasia with urinary frequency Ongoing, reports medication in the past were NOT effective. Discussed urology referral but he does not wish to go again at this time  7. Elevated PSA  -PSA of 7.4 noted in 2018, will follow up PSA level  at this time.   Next appt: 12/11/2018  Jeremy Fox. [place], [facility] [phone]

## 2018-09-05 NOTE — Patient Instructions (Addendum)
We will obtain labs today and check your electrolytes, b12 level, thyroid, and blood counts  To follow up in 3 months for routine follow up and AWV.   DASH Eating Plan DASH stands for "Dietary Approaches to Stop Hypertension." The DASH eating plan is a healthy eating plan that has been shown to reduce high blood pressure (hypertension). It may also reduce your risk for type 2 diabetes, heart disease, and stroke. The DASH eating plan may also help with weight loss. What are tips for following this plan?  General guidelines  Avoid eating more than 2,300 mg (milligrams) of salt (sodium) a day. If you have hypertension, you may need to reduce your sodium intake to 1,500 mg a day.  Limit alcohol intake to no more than 1 drink a day for nonpregnant women and 2 drinks a day for men. One drink equals 12 oz of beer, 5 oz of wine, or 1 oz of hard liquor.  Work with your health care provider to maintain a healthy body weight or to lose weight. Ask what an ideal weight is for you.  Get at least 30 minutes of exercise that causes your heart to beat faster (aerobic exercise) most days of the week. Activities may include walking, swimming, or biking.  Work with your health care provider or diet and nutrition specialist (dietitian) to adjust your eating plan to your individual calorie needs. Reading food labels   Check food labels for the amount of sodium per serving. Choose foods with less than 5 percent of the Daily Value of sodium. Generally, foods with less than 300 mg of sodium per serving fit into this eating plan.  To find whole grains, look for the word "whole" as the first word in the ingredient list. Shopping  Buy products labeled as "low-sodium" or "no salt added."  Buy fresh foods. Avoid canned foods and premade or frozen meals. Cooking  Avoid adding salt when cooking. Use salt-free seasonings or herbs instead of table salt or sea salt. Check with your health care provider or pharmacist  before using salt substitutes.  Do not fry foods. Cook foods using healthy methods such as baking, boiling, grilling, and broiling instead.  Cook with heart-healthy oils, such as olive, canola, soybean, or sunflower oil. Meal planning  Eat a balanced diet that includes: ? 5 or more servings of fruits and vegetables each day. At each meal, try to fill half of your plate with fruits and vegetables. ? Up to 6-8 servings of whole grains each day. ? Less than 6 oz of lean meat, poultry, or fish each day. A 3-oz serving of meat is about the same size as a deck of cards. One egg equals 1 oz. ? 2 servings of low-fat dairy each day. ? A serving of nuts, seeds, or beans 5 times each week. ? Heart-healthy fats. Healthy fats called Omega-3 fatty acids are found in foods such as flaxseeds and coldwater fish, like sardines, salmon, and mackerel.  Limit how much you eat of the following: ? Canned or prepackaged foods. ? Food that is high in trans fat, such as fried foods. ? Food that is high in saturated fat, such as fatty meat. ? Sweets, desserts, sugary drinks, and other foods with added sugar. ? Full-fat dairy products.  Do not salt foods before eating.  Try to eat at least 2 vegetarian meals each week.  Eat more home-cooked food and less restaurant, buffet, and fast food.  When eating at a restaurant, ask that  your food be prepared with less salt or no salt, if possible. What foods are recommended? The items listed may not be a complete list. Talk with your dietitian about what dietary choices are best for you. Grains Whole-grain or whole-wheat bread. Whole-grain or whole-wheat pasta. Brown rice. Modena Morrow. Bulgur. Whole-grain and low-sodium cereals. Pita bread. Low-fat, low-sodium crackers. Whole-wheat flour tortillas. Vegetables Fresh or frozen vegetables (raw, steamed, roasted, or grilled). Low-sodium or reduced-sodium tomato and vegetable juice. Low-sodium or reduced-sodium tomato  sauce and tomato paste. Low-sodium or reduced-sodium canned vegetables. Fruits All fresh, dried, or frozen fruit. Canned fruit in natural juice (without added sugar). Meat and other protein foods Skinless chicken or [place]. Ground chicken or [place]. Pork with fat trimmed off. Fish and seafood. Egg whites. Dried beans, peas, or lentils. Unsalted nuts, nut butters, and seeds. Unsalted canned beans. Lean cuts of beef with fat trimmed off. Low-sodium, lean deli meat. Dairy Low-fat (1%) or fat-free (skim) milk. Fat-free, low-fat, or reduced-fat cheeses. Nonfat, low-sodium ricotta or cottage cheese. Low-fat or nonfat yogurt. Low-fat, low-sodium cheese. Fats and oils Soft margarine without trans fats. Vegetable oil. Low-fat, reduced-fat, or light mayonnaise and salad dressings (reduced-sodium). Canola, safflower, olive, soybean, and sunflower oils. Avocado. Seasoning and other foods Herbs. Spices. Seasoning mixes without salt. Unsalted popcorn and pretzels. Fat-free sweets. What foods are not recommended? The items listed may not be a complete list. Talk with your dietitian about what dietary choices are best for you. Grains Baked goods made with fat, such as croissants, muffins, or some breads. Dry pasta or rice meal packs. Vegetables Creamed or fried vegetables. Vegetables in a cheese sauce. Regular canned vegetables (not low-sodium or reduced-sodium). Regular canned tomato sauce and paste (not low-sodium or reduced-sodium). Regular tomato and vegetable juice (not low-sodium or reduced-sodium). Angie Fava. Olives. Fruits Canned fruit in a light or heavy syrup. Fried fruit. Fruit in cream or butter sauce. Meat and other protein foods Fatty cuts of meat. Ribs. Fried meat. Berniece Salines. Sausage. Bologna and other processed lunch meats. Salami. Fatback. Hotdogs. Bratwurst. Salted nuts and seeds. Canned beans with added salt. Canned or smoked fish. Whole eggs or egg yolks. Chicken or [place] with skin. Dairy Whole  or 2% milk, cream, and half-and-half. Whole or full-fat cream cheese. Whole-fat or sweetened yogurt. Full-fat cheese. Nondairy creamers. Whipped toppings. Processed cheese and cheese spreads. Fats and oils Butter. Stick margarine. Lard. Shortening. Ghee. Bacon fat. Tropical oils, such as coconut, palm kernel, or palm oil. Seasoning and other foods Salted popcorn and pretzels. Onion salt, garlic salt, seasoned salt, table salt, and sea salt. Worcestershire sauce. Tartar sauce. Barbecue sauce. Teriyaki sauce. Soy sauce, including reduced-sodium. Steak sauce. Canned and packaged gravies. Fish sauce. Oyster sauce. Cocktail sauce. Horseradish that you find on the shelf. Ketchup. Mustard. Meat flavorings and tenderizers. Bouillon cubes. Hot sauce and Tabasco sauce. Premade or packaged marinades. Premade or packaged taco seasonings. Relishes. Regular salad dressings. Where to find more information:  National Heart, Lung, and [place]: https://wilson-eaton.com/  American Heart Association: www.heart.org Summary  The DASH eating plan is a healthy eating plan that has been shown to reduce high blood pressure (hypertension). It may also reduce your risk for type 2 diabetes, heart disease, and stroke.  With the DASH eating plan, you should limit salt (sodium) intake to 2,300 mg a day. If you have hypertension, you may need to reduce your sodium intake to 1,500 mg a day.  When on the DASH eating plan, aim to eat more fresh fruits and  vegetables, whole grains, lean proteins, low-fat dairy, and heart-healthy fats.  Work with your health care provider or diet and nutrition specialist (dietitian) to adjust your eating plan to your individual calorie needs. This information is not intended to replace advice given to you by your health care provider. Make sure you discuss any questions you have with your health care provider. Document Released: 02/11/2011 Document Revised: 02/04/2017 Document Reviewed:  02/16/2016 Elsevier Patient Education  2020 [employer].

## 2018-09-06 LAB — COMPLETE METABOLIC PANEL WITH GFR
AG Ratio: 1.9 (calc) (ref 1.0–2.5)
ALT: 15 U/L (ref 9–46)
AST: 21 U/L (ref 10–35)
Albumin: 4.4 g/dL (ref 3.6–5.1)
Alkaline phosphatase (APISO): 83 U/L (ref 35–144)
BUN/Creatinine Ratio: 20 (calc) (ref 6–22)
BUN: 24 mg/dL (ref 7–25)
CO2: 26 mmol/L (ref 20–32)
Calcium: 8.9 mg/dL (ref 8.6–10.3)
Chloride: 104 mmol/L (ref 98–110)
Creat: 1.22 mg/dL — ABNORMAL HIGH (ref 0.70–1.18)
GFR, Est African American: 67 mL/min/{1.73_m2} (ref >=60)
GFR, Est Non African American: 58 mL/min/{1.73_m2} — ABNORMAL LOW (ref >=60)
Globulin: 2.3 g/dL (calc) (ref 1.9–3.7)
Glucose, Bld: 87 mg/dL (ref 65–99)
Potassium: 4.3 mmol/L (ref 3.5–5.3)
Sodium: 142 mmol/L (ref 135–146)
Total Bilirubin: 0.8 mg/dL (ref 0.2–1.2)
Total Protein: 6.7 g/dL (ref 6.1–8.1)

## 2018-09-06 LAB — CBC WITH DIFFERENTIAL/PLATELET
Absolute Monocytes: 630 cells/uL (ref 200–950)
Basophils Absolute: 50 cells/uL (ref 0–200)
Basophils Relative: 0.6 %
Eosinophils Absolute: 361 cells/uL (ref 15–500)
Eosinophils Relative: 4.3 %
HCT: 48 % (ref 38.5–50.0)
Hemoglobin: 16.7 g/dL (ref 13.2–17.1)
Lymphs Abs: 2302 cells/uL (ref 850–3900)
MCH: 30.5 pg (ref 27.0–33.0)
MCHC: 34.8 g/dL (ref 32.0–36.0)
MCV: 87.6 fL (ref 80.0–100.0)
MPV: 11.9 fL (ref 7.5–12.5)
Monocytes Relative: 7.5 %
Neutro Abs: 5057 cells/uL (ref 1500–7800)
Neutrophils Relative %: 60.2 %
Platelets: 192 10*3/uL (ref 140–400)
RBC: 5.48 10*6/uL (ref 4.20–5.80)
RDW: 13.1 % (ref 11.0–15.0)
Total Lymphocyte: 27.4 %
WBC: 8.4 10*3/uL (ref 3.8–10.8)

## 2018-09-06 LAB — PSA: PSA: 5.2 ng/mL — ABNORMAL HIGH (ref <=4.0)

## 2018-09-06 LAB — VITAMIN B12: Vitamin B-12: 534 pg/mL (ref 200–1100)

## 2018-09-06 LAB — TSH: TSH: 1.2 mIU/L (ref 0.40–4.50)

## 2018-11-28 DIAGNOSIS — G4733 Obstructive sleep apnea (adult) (pediatric): Secondary | ICD-10-CM | POA: Diagnosis not present

## 2018-12-06 ENCOUNTER — Encounter: Payer: PPO | Admitting: Nurse Practitioner

## 2018-12-06 ENCOUNTER — Ambulatory Visit: Payer: PPO | Admitting: Nurse Practitioner

## 2018-12-11 ENCOUNTER — Ambulatory Visit: Payer: PPO | Admitting: Nurse Practitioner

## 2018-12-11 ENCOUNTER — Encounter: Payer: PPO | Admitting: Nurse Practitioner

## 2019-04-23 DIAGNOSIS — G4733 Obstructive sleep apnea (adult) (pediatric): Secondary | ICD-10-CM | POA: Diagnosis not present

## 2019-04-24 ENCOUNTER — Other Ambulatory Visit: Payer: Self-pay | Admitting: Nurse Practitioner

## 2019-04-24 ENCOUNTER — Telehealth: Payer: Self-pay | Admitting: Cardiology

## 2019-04-24 DIAGNOSIS — I10 Essential (primary) hypertension: Secondary | ICD-10-CM

## 2019-04-24 NOTE — Telephone Encounter (Signed)
  *  STAT* If patient is at the pharmacy, call can be transferred to refill team.   1. Which medications need to be refilled? (please list name of each medication and dose if known) amLODipine-benazepril (LOTREL) 10-40 MG capsule  2. Which pharmacy/location (including street and city if local pharmacy) is medication to be sent to? [facility] at [place], [place]  3. Do they need a 30 day or 90 day supply? 90 days

## 2019-04-25 ENCOUNTER — Other Ambulatory Visit: Payer: Self-pay | Admitting: *Deleted

## 2019-04-25 DIAGNOSIS — I10 Essential (primary) hypertension: Secondary | ICD-10-CM

## 2019-04-25 MED ORDER — AMLODIPINE BESY-BENAZEPRIL HCL 10-40 MG PO CAPS: 1.0000 | ORAL_CAPSULE | Freq: Every day | ORAL | 0 refills | Status: DC

## 2019-04-25 NOTE — Telephone Encounter (Signed)
Refill sent with note that he needs an office visit for more reills

## 2019-04-27 DIAGNOSIS — H903 Sensorineural hearing loss, bilateral: Secondary | ICD-10-CM | POA: Diagnosis not present

## 2019-04-30 DIAGNOSIS — R3915 Urgency of urination: Secondary | ICD-10-CM | POA: Diagnosis not present

## 2019-04-30 DIAGNOSIS — R972 Elevated prostate specific antigen [PSA]: Secondary | ICD-10-CM | POA: Diagnosis not present

## 2019-05-08 DIAGNOSIS — G8929 Other chronic pain: Secondary | ICD-10-CM | POA: Insufficient documentation

## 2019-05-08 DIAGNOSIS — M1712 Unilateral primary osteoarthritis, left knee: Secondary | ICD-10-CM | POA: Diagnosis not present

## 2019-05-08 DIAGNOSIS — M1711 Unilateral primary osteoarthritis, right knee: Secondary | ICD-10-CM | POA: Diagnosis not present

## 2019-05-08 DIAGNOSIS — M25561 Pain in right knee: Secondary | ICD-10-CM | POA: Diagnosis not present

## 2019-05-08 DIAGNOSIS — M898X1 Other specified disorders of bone, shoulder: Secondary | ICD-10-CM | POA: Diagnosis not present

## 2019-05-08 DIAGNOSIS — M25562 Pain in left knee: Secondary | ICD-10-CM | POA: Diagnosis not present

## 2019-05-08 DIAGNOSIS — M25511 Pain in right shoulder: Secondary | ICD-10-CM | POA: Diagnosis not present

## 2019-05-09 DIAGNOSIS — M1712 Unilateral primary osteoarthritis, left knee: Secondary | ICD-10-CM | POA: Diagnosis not present

## 2019-05-09 DIAGNOSIS — M898X1 Other specified disorders of bone, shoulder: Secondary | ICD-10-CM | POA: Diagnosis not present

## 2019-05-09 DIAGNOSIS — M1711 Unilateral primary osteoarthritis, right knee: Secondary | ICD-10-CM | POA: Diagnosis not present

## 2019-05-11 DIAGNOSIS — H903 Sensorineural hearing loss, bilateral: Secondary | ICD-10-CM | POA: Diagnosis not present

## 2019-05-16 ENCOUNTER — Ambulatory Visit: Payer: PPO | Admitting: Cardiology

## 2019-05-16 ENCOUNTER — Other Ambulatory Visit: Payer: Self-pay | Admitting: Orthopedic Surgery

## 2019-05-16 DIAGNOSIS — M25519 Pain in unspecified shoulder: Secondary | ICD-10-CM

## 2019-05-20 ENCOUNTER — Other Ambulatory Visit: Payer: Self-pay

## 2019-05-20 ENCOUNTER — Ambulatory Visit
Admission: RE | Admit: 2019-05-20 | Discharge: 2019-05-20 | Disposition: A | Discharge status: Home / Self Care | Payer: PPO | Source: Ambulatory Visit | Attending: Orthopedic Surgery | Admitting: Orthopedic Surgery

## 2019-05-20 DIAGNOSIS — M25519 Pain in unspecified shoulder: Secondary | ICD-10-CM

## 2019-05-20 DIAGNOSIS — M25511 Pain in right shoulder: Secondary | ICD-10-CM | POA: Diagnosis not present

## 2019-05-22 DIAGNOSIS — M898X1 Other specified disorders of bone, shoulder: Secondary | ICD-10-CM | POA: Diagnosis not present

## 2019-05-22 DIAGNOSIS — M19011 Primary osteoarthritis, right shoulder: Secondary | ICD-10-CM | POA: Diagnosis not present

## 2019-05-30 DIAGNOSIS — M19011 Primary osteoarthritis, right shoulder: Secondary | ICD-10-CM | POA: Diagnosis not present

## 2019-05-30 DIAGNOSIS — G8918 Other acute postprocedural pain: Secondary | ICD-10-CM | POA: Diagnosis not present

## 2019-05-30 DIAGNOSIS — M7541 Impingement syndrome of right shoulder: Secondary | ICD-10-CM | POA: Diagnosis not present

## 2019-05-30 DIAGNOSIS — M94211 Chondromalacia, right shoulder: Secondary | ICD-10-CM | POA: Diagnosis not present

## 2019-05-30 DIAGNOSIS — M24111 Other articular cartilage disorders, right shoulder: Secondary | ICD-10-CM | POA: Diagnosis not present

## 2019-06-05 DIAGNOSIS — Z9889 Other specified postprocedural states: Secondary | ICD-10-CM | POA: Insufficient documentation

## 2019-06-06 DIAGNOSIS — M62521 Muscle wasting and atrophy, not elsewhere classified, right upper arm: Secondary | ICD-10-CM | POA: Diagnosis not present

## 2019-06-06 DIAGNOSIS — M25511 Pain in right shoulder: Secondary | ICD-10-CM | POA: Diagnosis not present

## 2019-06-06 DIAGNOSIS — M7541 Impingement syndrome of right shoulder: Secondary | ICD-10-CM | POA: Diagnosis not present

## 2019-06-07 DIAGNOSIS — M216X1 Other acquired deformities of right foot: Secondary | ICD-10-CM | POA: Insufficient documentation

## 2019-06-07 DIAGNOSIS — B351 Tinea unguium: Secondary | ICD-10-CM | POA: Insufficient documentation

## 2019-06-12 DIAGNOSIS — M25511 Pain in right shoulder: Secondary | ICD-10-CM | POA: Diagnosis not present

## 2019-06-12 DIAGNOSIS — M62521 Muscle wasting and atrophy, not elsewhere classified, right upper arm: Secondary | ICD-10-CM | POA: Diagnosis not present

## 2019-06-12 DIAGNOSIS — M7541 Impingement syndrome of right shoulder: Secondary | ICD-10-CM | POA: Diagnosis not present

## 2019-06-19 ENCOUNTER — Other Ambulatory Visit: Payer: Self-pay | Admitting: Nurse Practitioner

## 2019-06-19 DIAGNOSIS — I10 Essential (primary) hypertension: Secondary | ICD-10-CM

## 2019-06-19 NOTE — Telephone Encounter (Signed)
Patient has canceled and no showed several times within the last year.  Appointment is overdue  Number 7 to be given to allow patient 1 week to schedule appointment and be seen. We have availability today and everyday this week.

## 2019-06-21 DIAGNOSIS — M7541 Impingement syndrome of right shoulder: Secondary | ICD-10-CM | POA: Diagnosis not present

## 2019-06-21 DIAGNOSIS — M25511 Pain in right shoulder: Secondary | ICD-10-CM | POA: Diagnosis not present

## 2019-06-21 DIAGNOSIS — M62521 Muscle wasting and atrophy, not elsewhere classified, right upper arm: Secondary | ICD-10-CM | POA: Diagnosis not present

## 2019-06-25 ENCOUNTER — Other Ambulatory Visit: Payer: Self-pay | Admitting: Cardiology

## 2019-06-25 DIAGNOSIS — I10 Essential (primary) hypertension: Secondary | ICD-10-CM

## 2019-06-25 MED ORDER — CLONIDINE HCL 0.2 MG PO TABS: ORAL_TABLET | ORAL | 3 refills | Status: DC

## 2019-06-25 NOTE — Telephone Encounter (Signed)
Ok, to refill Catapress 0.2

## 2019-06-25 NOTE — Telephone Encounter (Signed)
Spoke with patient and let him know that the refill has been sent in.    Encouraged patient to call back with any questions or concerns.

## 2019-06-25 NOTE — Telephone Encounter (Signed)
*  STAT* If patient is at the pharmacy, call can be transferred to refill team.   1. Which medications need to be refilled? (please list name of each medication and dose if known) cloNIDine (CATAPRES) 0.2 MG tablet  2. Which pharmacy/location (including street and city if local pharmacy) is medication to be sent to? [facility] at [place], [place]  3. Do they need a 30 day or 90 day supply? 90 day  Patient is insisting Dr. Agustin Cree refill his medication and not his PCP.

## 2019-07-11 ENCOUNTER — Ambulatory Visit: Payer: PPO | Admitting: Cardiology

## 2019-07-11 ENCOUNTER — Encounter: Payer: Self-pay | Admitting: Cardiology

## 2019-07-11 ENCOUNTER — Other Ambulatory Visit: Payer: Self-pay

## 2019-07-11 VITALS — BP 154/72 | HR 61 | Ht 72.0 in | Wt 241.0 lb

## 2019-07-11 DIAGNOSIS — E785 Hyperlipidemia, unspecified: Secondary | ICD-10-CM | POA: Diagnosis not present

## 2019-07-11 DIAGNOSIS — I10 Essential (primary) hypertension: Secondary | ICD-10-CM

## 2019-07-11 DIAGNOSIS — G4733 Obstructive sleep apnea (adult) (pediatric): Secondary | ICD-10-CM | POA: Diagnosis not present

## 2019-07-11 DIAGNOSIS — I119 Hypertensive heart 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: Secondary | ICD-10-CM

## 2019-07-11 NOTE — Patient Instructions (Signed)

## 2019-07-11 NOTE — Progress Notes (Signed)
Cardiology Office Note:    Date:  07/11/2019   ID:  Jeremy Fox, DOB 1945-03-31, MRN [id number]  PCP:  Lauree Chandler, NP  Cardiologist:  Jenne Campus, MD    Referring MD: Lauree Chandler, NP   No chief complaint on file. Doing well  History of Present Illness:    Jeremy Fox is a 74 y.o. male with past medical history significant for essential hypertension, dyslipidemia, obstructive sleep apnea, recently he had right shoulder surgery he is very happy with the results of it.  Overall doing very well.  Denies have any chest pain tightness squeezing pressure burning chest he told me straight that he does not like to see doctors and when asking to see me back in about 6 months he said he prefers 1 year.  Luckily overall he seems to be doing well still active able to walk climb stairs with no difficulties  Past Medical History:  Diagnosis Date  . Benign prostatic hyperplasia    Per records received from [facility], also in care everywhere  . Dyslipidemia   . Elevated PSA    Per records received from [facility], also in care everywhere  . Essential hypertension   . 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 OA (osteoarthritis) of knee 06/03/2015  . OSA (obstructive sleep apnea)   . Rotator cuff syndrome of left shoulder 06/03/2015  . 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 KNEE SURGERY      Current Medications: Current Meds  Medication Sig  . tamsulosin (FLOMAX) 0.4 MG CAPS capsule Take 1 capsule by mouth daily.     Allergies:   Sulfa antibiotics and Sulfamethoxazole   Social History   Socioeconomic History  . Marital status: Single    Spouse name: Not on file  . Number of children: Not on file  . Years of education: Not on file  . Highest education level: Not on file  Occupational History  . Not on file  Tobacco Use  . Smoking status: Never Smoker  . Smokeless tobacco: Never Used   Substance and Sexual Activity  . Alcohol use: Never  . Drug use: No  . Sexual activity: Not on file  Other Topics Concern  . Not on file  Social History Narrative   Per [facility] New Patient Packet    Diet: N/A      Caffeine: N/A      Married, if yes what year: Divorced       Do you live in a house, apartment, assisted living, condo, trailer, ect: House, one stories, and one person       Pets: No      Current/Past profession: Print production planner, Lineman      Exercise: Yes, daily, treadmill          Living Will: Yes   DNR: No   POA/HPOA: Yes      Functional Status:   Do you have difficulty bathing or dressing yourself? No   Do you have difficulty preparing food or eating? No   Do you have difficulty managing your medications?No   Do you have difficulty managing your finances? No   Do you have difficulty affording your medications? No   Social Determinants of Health   Financial Resource Strain:   . Difficulty of Paying Living Expenses:   Food Insecurity:   . Worried About Charity fundraiser in the Last Year:   . Ran  Out of Food in the Last Year:   Transportation Needs:   . Lack of Transportation (Medical):   Marland Kitchen Lack of Transportation (Non-Medical):   Physical Activity:   . Days of Exercise per Week:   . Minutes of Exercise per Session:   Stress:   . Feeling of Stress :   Social Connections:   . Frequency of Communication with Friends and Family:   . Frequency of Social Gatherings with Friends and Family:   . Attends Religious Services:   . Active Member of Clubs or Organizations:   . Attends Archivist Meetings:   Marland Kitchen Marital Status:      Family History: The patient's family history includes Diabetes in his brother; Heart attack in his father; Heart disease in his father. ROS:   Please see the history of present illness.    All 14 point review of systems negative except as described per history of present illness  EKGs/Labs/Other Studies Reviewed:     Normal sinus rhythm, normal P interval, normal QS complex duration morphology, no ST segment changes  Recent Labs: 09/05/2018: ALT 15; BUN 24; Creat 1.22; Hemoglobin 16.7; Platelets 192; Potassium 4.3; Sodium 142; TSH 1.20  Recent Lipid Panel    Component Value Date/Time   CHOL 173 05/01/2018 0803   TRIG 73 05/01/2018 0803   HDL 49 05/01/2018 0803   CHOLHDL 3.5 05/01/2018 0803   LDLCALC 109 (H) 05/01/2018 0803    Physical Exam:    VS:  BP (!) 154/72   Pulse 61   Ht 6' (1.829 m)   Wt 241 lb (109.3 kg)   SpO2 97%   BMI 32.69 kg/m     Wt Readings from Last 3 Encounters:  07/11/19 241 lb (109.3 kg)  09/05/18 231 lb (104.8 kg)  04/14/18 232 lb 12.8 oz (105.6 kg)     GEN:  Well nourished, well developed in no acute distress HEENT: Normal NECK: No JVD; No carotid bruits LYMPHATICS: No lymphadenopathy CARDIAC: RRR, no murmurs, no rubs, no gallops RESPIRATORY:  Clear to auscultation without rales, wheezing or rhonchi  ABDOMEN: Soft, non-tender, non-distended MUSCULOSKELETAL:  No edema; No deformity  SKIN: Warm and dry LOWER EXTREMITIES: no swelling NEUROLOGIC:  Alert and oriented x 3 PSYCHIATRIC:  Normal affect   ASSESSMENT:    1. Essential hypertension   2. Hypertensive heart 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   3. Dyslipidemia   4. OSA (obstructive sleep apnea)    PLAN:    In order of problems listed above:  1. Essential hypertension blood pressure slightly elevated today but he said when he checked his blood pressure at home blood pressures less than [id number] systolic.  We will continue present management. 2. Hypertensive heart disease.  Noted. 3. Dyslipidemia: I do have his cholesterol problem APN showing LDL of 109 and HDL of 49.  He does not want to take any medications for it. 4. Obstructive sleep apnea use CPAP mask on a regular basis we will continue present management.   Medication Adjustments/Labs and Tests Ordered: Current medicines are reviewed at length with  the patient today.  Concerns regarding medicines are outlined above.  Orders Placed This Encounter  Procedures  . EKG 12-Lead   Medication changes: No orders of the defined types were placed in this encounter.   Signed, Park Liter, MD, [facility] 07/11/2019 9:51 AM    [place]

## 2019-07-19 ENCOUNTER — Other Ambulatory Visit: Payer: Self-pay | Admitting: Cardiology

## 2019-07-19 DIAGNOSIS — I10 Essential (primary) hypertension: Secondary | ICD-10-CM

## 2019-11-06 DIAGNOSIS — G4733 Obstructive sleep apnea (adult) (pediatric): Secondary | ICD-10-CM | POA: Diagnosis not present

## 2019-11-15 ENCOUNTER — Other Ambulatory Visit: Payer: Self-pay | Admitting: Cardiology

## 2019-11-15 DIAGNOSIS — I10 Essential (primary) hypertension: Secondary | ICD-10-CM

## 2019-11-15 DIAGNOSIS — M170 Bilateral primary osteoarthritis of knee: Secondary | ICD-10-CM | POA: Diagnosis not present

## 2019-11-22 DIAGNOSIS — G4733 Obstructive sleep apnea (adult) (pediatric): Secondary | ICD-10-CM | POA: Diagnosis not present

## 2019-12-11 DIAGNOSIS — H52203 Unspecified astigmatism, bilateral: Secondary | ICD-10-CM | POA: Diagnosis not present

## 2019-12-11 DIAGNOSIS — Z961 Presence of intraocular lens: Secondary | ICD-10-CM | POA: Diagnosis not present

## 2020-02-26 DIAGNOSIS — M170 Bilateral primary osteoarthritis of knee: Secondary | ICD-10-CM | POA: Diagnosis not present

## 2020-03-09 ENCOUNTER — Other Ambulatory Visit: Payer: Self-pay | Admitting: Cardiology

## 2020-03-09 DIAGNOSIS — I10 Essential (primary) hypertension: Secondary | ICD-10-CM

## 2020-06-03 DIAGNOSIS — M170 Bilateral primary osteoarthritis of knee: Secondary | ICD-10-CM | POA: Diagnosis not present

## 2020-06-08 ENCOUNTER — Other Ambulatory Visit: Payer: Self-pay | Admitting: Cardiology

## 2020-06-08 DIAGNOSIS — I10 Essential (primary) hypertension: Secondary | ICD-10-CM

## 2020-06-09 NOTE — Telephone Encounter (Signed)
Amlodipine-Benazepril approved and sent

## 2020-08-21 ENCOUNTER — Ambulatory Visit: Payer: PPO | Admitting: Cardiology

## 2020-09-04 DIAGNOSIS — M170 Bilateral primary osteoarthritis of knee: Secondary | ICD-10-CM | POA: Diagnosis not present

## 2020-09-18 ENCOUNTER — Telehealth: Payer: Self-pay | Admitting: Cardiology

## 2020-09-18 DIAGNOSIS — N401 Enlarged prostate with lower urinary tract symptoms: Secondary | ICD-10-CM | POA: Diagnosis not present

## 2020-09-18 DIAGNOSIS — I119 Hypertensive heart 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: Secondary | ICD-10-CM | POA: Diagnosis not present

## 2020-09-18 DIAGNOSIS — E782 Mixed hyperlipidemia: Secondary | ICD-10-CM | POA: Diagnosis not present

## 2020-09-18 DIAGNOSIS — G4733 Obstructive sleep apnea (adult) (pediatric): Secondary | ICD-10-CM | POA: Diagnosis not present

## 2020-09-18 DIAGNOSIS — Z01818 Encounter for other preprocedural examination: Secondary | ICD-10-CM | POA: Diagnosis not present

## 2020-09-18 DIAGNOSIS — R350 Frequency of micturition: Secondary | ICD-10-CM | POA: Diagnosis not present

## 2020-09-18 DIAGNOSIS — E6609 Other obesity due to excess calories: Secondary | ICD-10-CM | POA: Diagnosis not present

## 2020-09-18 DIAGNOSIS — R972 Elevated prostate specific antigen [PSA]: Secondary | ICD-10-CM | POA: Diagnosis not present

## 2020-09-18 DIAGNOSIS — R9431 Abnormal electrocardiogram [ECG] [EKG]: Secondary | ICD-10-CM | POA: Diagnosis not present

## 2020-09-18 DIAGNOSIS — Z6831 Body mass index (BMI) 31.0-31.9, adult: Secondary | ICD-10-CM | POA: Diagnosis not present

## 2020-09-18 NOTE — Telephone Encounter (Signed)
Spoke with patient about setting up a sooner appointment to see Dr. Agustin Cree to be cleared for surgery. Pat appointment moved to 8/10, will ask front desk to call patient if we have any cancellations.

## 2020-09-18 NOTE — Telephone Encounter (Signed)
New message:     Patient calling to see if he can see doctor Agustin Cree on his DOD. Patient states we have canceled a few times on him and he knee surgery soon. Patient states he is in a lot of pain. Please advise. Told patient they will be back next week.

## 2020-09-19 DIAGNOSIS — R350 Frequency of micturition: Secondary | ICD-10-CM | POA: Diagnosis not present

## 2020-09-19 DIAGNOSIS — Z6831 Body mass index (BMI) 31.0-31.9, adult: Secondary | ICD-10-CM | POA: Diagnosis not present

## 2020-09-19 DIAGNOSIS — E6609 Other obesity due to excess calories: Secondary | ICD-10-CM | POA: Diagnosis not present

## 2020-09-19 DIAGNOSIS — I119 Hypertensive heart 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: Secondary | ICD-10-CM | POA: Diagnosis not present

## 2020-09-19 DIAGNOSIS — Z79899 Other long term (current) drug therapy: Secondary | ICD-10-CM | POA: Diagnosis not present

## 2020-09-19 DIAGNOSIS — N401 Enlarged prostate with lower urinary tract symptoms: Secondary | ICD-10-CM | POA: Diagnosis not present

## 2020-09-19 DIAGNOSIS — E782 Mixed hyperlipidemia: Secondary | ICD-10-CM | POA: Diagnosis not present

## 2020-09-19 DIAGNOSIS — Z01818 Encounter for other preprocedural examination: Secondary | ICD-10-CM | POA: Diagnosis not present

## 2020-09-19 DIAGNOSIS — R972 Elevated prostate specific antigen [PSA]: Secondary | ICD-10-CM | POA: Diagnosis not present

## 2020-09-22 DIAGNOSIS — G4733 Obstructive sleep apnea (adult) (pediatric): Secondary | ICD-10-CM | POA: Diagnosis not present

## 2020-09-26 ENCOUNTER — Other Ambulatory Visit: Payer: Self-pay | Admitting: Nurse Practitioner

## 2020-09-26 DIAGNOSIS — R9389 Abnormal findings on diagnostic imaging of other specified body structures: Secondary | ICD-10-CM

## 2020-10-03 ENCOUNTER — Ambulatory Visit
Admission: RE | Admit: 2020-10-03 | Discharge: 2020-10-03 | Disposition: A | Discharge status: Home / Self Care | Payer: PPO | Source: Ambulatory Visit | Attending: Nurse Practitioner | Admitting: Nurse Practitioner

## 2020-10-03 DIAGNOSIS — R9389 Abnormal findings on diagnostic imaging of other specified body structures: Secondary | ICD-10-CM

## 2020-10-03 DIAGNOSIS — I700 Atherosclerosis of aorta: Secondary | ICD-10-CM | POA: Diagnosis not present

## 2020-10-03 DIAGNOSIS — R911 Solitary pulmonary nodule: Secondary | ICD-10-CM | POA: Diagnosis not present

## 2020-10-03 DIAGNOSIS — I2510 Atherosclerotic heart disease of native coronary artery without angina pectoris: Secondary | ICD-10-CM | POA: Diagnosis not present

## 2020-10-03 MED ORDER — IOPAMIDOL (ISOVUE-300) INJECTION 61%
75.0000 mL | Freq: Once | INTRAVENOUS | Status: AC | PRN
  Administered 2020-10-03: 75 mL via INTRAVENOUS

## 2020-10-14 ENCOUNTER — Other Ambulatory Visit: Payer: Self-pay

## 2020-10-14 DIAGNOSIS — N400 Enlarged prostate without lower urinary tract symptoms: Secondary | ICD-10-CM | POA: Insufficient documentation

## 2020-10-15 ENCOUNTER — Encounter: Payer: Self-pay | Admitting: Cardiology

## 2020-10-15 ENCOUNTER — Ambulatory Visit: Payer: PPO | Admitting: Cardiology

## 2020-10-15 ENCOUNTER — Other Ambulatory Visit: Payer: Self-pay

## 2020-10-15 VITALS — BP 150/80 | HR 79 | Ht 72.0 in | Wt 232.0 lb

## 2020-10-15 DIAGNOSIS — E785 Hyperlipidemia, unspecified: Secondary | ICD-10-CM

## 2020-10-15 DIAGNOSIS — Z01810 Encounter for preprocedural cardiovascular examination: Secondary | ICD-10-CM | POA: Insufficient documentation

## 2020-10-15 DIAGNOSIS — I10 Essential (primary) hypertension: Secondary | ICD-10-CM | POA: Diagnosis not present

## 2020-10-15 DIAGNOSIS — G4733 Obstructive sleep apnea (adult) (pediatric): Secondary | ICD-10-CM

## 2020-10-15 DIAGNOSIS — R0789 Other chest pain: Secondary | ICD-10-CM

## 2020-10-15 DIAGNOSIS — I498 Other specified cardiac arrhythmias: Secondary | ICD-10-CM | POA: Diagnosis not present

## 2020-10-15 NOTE — Progress Notes (Signed)
Cardiology Office Note:    Date:  10/15/2020   ID:  Jeremy Fox, DOB [birth year], MRN [id number]  PCP:  Lauree Chandler, NP  Cardiologist:  Jenne Campus, MD    Referring MD: Lauree Chandler, NP   Chief Complaint  Patient presents with   Clearance TBD    History of Present Illness:    Jeremy Fox is a 75 y.o. male with past medical history significant for essential hypertension, dyslipidemia, obstructive sleep apnea.  He comes today to my office because he required above-knee replacement surgery.  He his ability to exercise is limited because of pain in his legs.  Denies have any cardiac complaints.  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   Glucosamine-Chondroit-Vit C-Mn (GLUCOSAMINE 1500 COMPLEX PO) Take 1 capsule by mouth daily.   Multiple Vitamin (MULTIVITAMIN) capsule Take 1 capsule by mouth daily. Unknown strength     Allergies:   Sulfa antibiotics and Sulfamethoxazole   Social History   Socioeconomic History   Marital status: Single    Spouse name: Not on file   Number of children: Not on file   Years of education: Not on file   Highest education level: Not on file  Occupational History   Not  on file  Tobacco Use   Smoking status: Never   Smokeless tobacco: Never  Vaping Use   Vaping Use: Never used  Substance and Sexual Activity   Alcohol use: Never   Drug use: No   Sexual activity: Not on file  Other Topics Concern   Not on file  Social History Narrative   Per [facility] New Patient Packet    Diet: N/A      Caffeine: N/A      Married, if yes what year: Divorced       Do you live in a house, apartment, assisted living, condo, trailer, ect: House, one stories, and one person       Pets: No      Current/Past profession: Print production planner, Lineman      Exercise: Yes, daily, treadmill          Living Will: Yes   DNR: No   POA/HPOA: Yes      Functional Status:   Do you have difficulty bathing or dressing yourself? No   Do you have difficulty preparing food or eating? No   Do you have difficulty managing your medications?No   Do you have difficulty managing your finances? No   Do you have difficulty affording your medications? No   Social Determinants of Radio broadcast assistant Strain: Not on file  Food Insecurity: Not on file  Transportation Needs: Not on file  Physical Activity: Not on file  Stress: Not on file  Social Connections: Not on file     Family History: The patient's family history includes Diabetes in his brother; Heart attack in his father; Heart disease in his father. ROS:   Please see the history of present illness.    All 14 point review of systems negative except as described per history of present illness  EKGs/Labs/Other Studies Reviewed:      Recent Labs: No results found for requested labs within last 8760 hours.  Recent Lipid Panel    Component Value Date/Time   CHOL 173 05/01/2018 0803   TRIG 73 05/01/2018 0803   HDL 49 05/01/2018 0803   CHOLHDL 3.5 05/01/2018 0803   LDLCALC 109 (H) 05/01/2018 0803    Physical Exam:    VS:  BP (!) 150/80 (BP Location: Right Arm, Patient Position: Sitting)   Pulse 79   Ht 6' (1.829 m)    Wt 232 lb (105.2 kg)   SpO2 97%   BMI 31.46 kg/m     Wt Readings from Last 3 Encounters:  10/15/20 232 lb (105.2 kg)  07/11/19 241 lb (109.3 kg)  09/05/18 231 lb (104.8 kg)     GEN:  Well nourished, well developed in no acute distress HEENT: Normal NECK: No JVD; No carotid bruits LYMPHATICS: No lymphadenopathy CARDIAC: RRR, no murmurs, no rubs, no gallops RESPIRATORY:  Clear to auscultation without rales, wheezing or rhonchi  ABDOMEN: Soft, non-tender, non-distended MUSCULOSKELETAL:  No edema; No deformity  SKIN: Warm and dry LOWER EXTREMITIES: no swelling NEUROLOGIC:  Alert and oriented x 3 PSYCHIATRIC:  Normal affect   ASSESSMENT:    1. Essential hypertension   2. Preop cardiovascular exam   3. Dyslipidemia   4. Ventricular bigeminy   5. OSA (obstructive sleep apnea)    PLAN:    In order of problems listed above:  Cardiovascular preop evaluation.  Surgery overall is considered a low risk.  However, either spinal or general anesthesia need to be used which does carry some risk.  His ability to exercise is limited because of knee pain.  I do think he is able to achieve 4 METS.  Therefore, I think prudent approach would be to pursue stress testing.  We will schedule him to have [place].  I will not alter any of his medication right now. Essential hypertension his blood pressure slightly elevated today but he is excited about all the situations.  I will not change any of his medication right now.  If any preoperative time his blood pressure is elevated clonidine 0.1.  Can be used. Dyslipidemia.  I do have his last lab work test from 2020.  In the future we will make arrangements to have cholesterol checked. History of ventricular bigeminy.  Again will do stress test make sure ejection fraction is preserved and there is no inducible ischemia. Obstructive sleep apnea using CPAP mask still.   Medication Adjustments/Labs and Tests Ordered: Current medicines are reviewed at  length with the patient today.  Concerns regarding medicines are outlined above.  No orders of the defined types were placed in this encounter.  Medication changes: No orders of the defined types were placed in this encounter.   Signed, Park Liter, MD, [facility] 10/15/2020 10:28 AM    [place]

## 2020-10-15 NOTE — Patient Instructions (Signed)
Medication Instructions:  Your physician recommends that you continue on your current medications as directed. Please refer to the Current Medication list given to you today.  *If you need a refill on your cardiac medications before your next appointment, please call your pharmacy*   Lab Work: None If you have labs (blood work) drawn today and your tests are completely normal, you will receive your results only by: [place] (if you have MyChart) OR A paper copy in the mail If you have any lab test that is abnormal or we need to change your treatment, we will call you to review the results.   Testing/Procedures:  Your physician has requested that you have a lexiscan myoview. For further information please visit [email or web address]. Please follow instruction sheet, as given.   The test will take approximately 3 to 4 hours to complete; you may bring reading material.  If someone comes with you to your appointment, they will need to remain in the main lobby due to limited space in the testing area. **If you are pregnant or breastfeeding, please notify the nuclear lab prior to your appointment**  How to prepare for your Myocardial Perfusion Test: Do not eat or drink 3 hours prior to your test, except you may have water. Do not consume products containing caffeine (regular or decaffeinated) 12 hours prior to your test. (ex: coffee, chocolate, sodas, tea). Do bring a list of your current medications with you.  If not listed below, you may take your medications as normal. Do wear comfortable clothes (no dresses or overalls) and walking shoes, tennis shoes preferred (No heels or open toe shoes are allowed). Do NOT wear cologne, perfume, aftershave, or lotions (deodorant is allowed). If these instructions are not followed, your test will have to be rescheduled.    Follow-Up: At [facility], you and your health needs are our priority.  As part of our continuing mission to provide you  with exceptional heart care, we have created designated Provider Care Teams.  These Care Teams include your primary Cardiologist (physician) and Advanced Practice Providers (APPs -  Physician Assistants and Nurse Practitioners) who all work together to provide you with the care you need, when you need it.  We recommend signing up for the patient portal called "MyChart".  Sign up information is provided on this After Visit Summary.  MyChart is used to connect with patients for Virtual Visits (Telemedicine).  Patients are able to view lab/test results, encounter notes, upcoming appointments, etc.  Non-urgent messages can be sent to your provider as well.   To learn more about what you can do with MyChart, go to [email or web address].    Your next appointment:   6 month(s)  The format for your next appointment:   In Person  Provider:   Jenne Campus, MD   Other Instructions

## 2020-10-16 ENCOUNTER — Telehealth (HOSPITAL_COMMUNITY): Payer: Self-pay | Admitting: *Deleted

## 2020-10-16 NOTE — Telephone Encounter (Signed)
Left message on voicemail per DPR in reference to upcoming appointment scheduled on 10/22/20 at 8:00 with detailed instructions given per Myocardial Perfusion Study Information Sheet for the test. LM to arrive 15 minutes early, and that it is imperative to arrive on time for appointment to keep from having the test rescheduled. If you need to cancel or reschedule your appointment, please call the office within 24 hours of your appointment. Failure to do so may result in a cancellation of your appointment, and a $50 no show fee. Phone number given for call back for any questions.

## 2020-10-22 ENCOUNTER — Other Ambulatory Visit: Payer: Self-pay

## 2020-10-22 ENCOUNTER — Telehealth: Payer: Self-pay

## 2020-10-22 ENCOUNTER — Ambulatory Visit (INDEPENDENT_AMBULATORY_CARE_PROVIDER_SITE_OTHER): Payer: PPO

## 2020-10-22 DIAGNOSIS — Z01810 Encounter for preprocedural cardiovascular examination: Secondary | ICD-10-CM

## 2020-10-22 LAB — MYOCARDIAL PERFUSION IMAGING
LV dias vol: 104 mL (ref 62–150)
LV sys vol: 35 mL
Peak HR: 93 {beats}/min
Rest HR: 79 {beats}/min
SDS: 2
SRS: 0
SSS: 2
TID: 1.03

## 2020-10-22 MED ORDER — TECHNETIUM TC 99M TETROFOSMIN IV KIT
11.0000 | PACK | Freq: Once | INTRAVENOUS | Status: AC | PRN
  Administered 2020-10-22: 11 via INTRAVENOUS

## 2020-10-22 MED ORDER — TECHNETIUM TC 99M TETROFOSMIN IV KIT
32.4000 | PACK | Freq: Once | INTRAVENOUS | Status: AC | PRN
  Administered 2020-10-22: 32.4 via INTRAVENOUS

## 2020-10-22 MED ORDER — REGADENOSON 0.4 MG/5ML IV SOLN
0.4000 mg | Freq: Once | INTRAVENOUS | Status: AC
  Administered 2020-10-22: 0.4 mg via INTRAVENOUS

## 2020-10-22 NOTE — Telephone Encounter (Signed)
Spoke with patient regarding results and recommendation.  Patient verbalizes understanding and is agreeable to plan of care. Advised patient to call back with any issues or concerns.  

## 2020-10-22 NOTE — Telephone Encounter (Signed)
-----   Message from Park Liter, MD sent at 10/22/2020  3:55 PM EDT ----- Stress test showing no evidence of ischemia.  He can proceed with surgery

## 2020-10-24 NOTE — Telephone Encounter (Signed)
Pt is reaching out wanting to know if clearance form has been sent over to knee surgeon.. please advise

## 2020-10-28 ENCOUNTER — Telehealth: Payer: Self-pay | Admitting: Cardiology

## 2020-10-28 ENCOUNTER — Ambulatory Visit: Payer: PPO | Admitting: Cardiology

## 2020-10-28 NOTE — Telephone Encounter (Signed)
Left a detail message explaining the patient on 10/22/20, Dr. Agustin Cree approved him to proceed with surgery and if needed feel free to call.

## 2020-10-28 NOTE — Telephone Encounter (Signed)
Spoke with pt and advised that in [place] I do not see a clearance form. I forwarded his lexiscan to Dr. Ronnie Derby in [place].

## 2020-10-28 NOTE — Telephone Encounter (Signed)
    Pt is calling to get an update of his pre op clearance, he wants to get a cb today

## 2020-10-29 ENCOUNTER — Telehealth: Payer: Self-pay

## 2020-10-29 ENCOUNTER — Telehealth: Payer: Self-pay | Admitting: Cardiology

## 2020-10-29 NOTE — Telephone Encounter (Signed)
Spoke with Loma Sousa at [facility] she confirmed all documents needed for pre op including risk assessment sign by Dr. Raliegh Ip received. Clearance approved processed for scanning.

## 2020-10-29 NOTE — Telephone Encounter (Signed)
Last office note and lexiscan resent via Epic to Dr. Ronnie Derby. I have called and left a message for Loma Sousa to let me know if she has received the information.

## 2020-10-29 NOTE — Telephone Encounter (Signed)
Pt came in today to see if we could send cardiac clearance to Dr. Ronnie Derby, at [facility]. He says they faxed the forms last week.  Please advise. [phone]  Pt very disgruntled. If anything is needed for the cardiac clearance please call [facility] at [phone]  Thank you! Ammie Dalton

## 2020-11-11 DIAGNOSIS — Z419 Encounter for procedure for purposes other than remedying health state, unspecified: Secondary | ICD-10-CM | POA: Diagnosis not present

## 2020-12-10 DIAGNOSIS — G8918 Other acute postprocedural pain: Secondary | ICD-10-CM | POA: Diagnosis not present

## 2020-12-10 DIAGNOSIS — M1712 Unilateral primary osteoarthritis, left knee: Secondary | ICD-10-CM | POA: Diagnosis not present

## 2020-12-16 DIAGNOSIS — R2689 Other abnormalities of gait and mobility: Secondary | ICD-10-CM | POA: Diagnosis not present

## 2020-12-16 DIAGNOSIS — Z4733 Aftercare following explantation of knee joint prosthesis: Secondary | ICD-10-CM | POA: Diagnosis not present

## 2020-12-16 DIAGNOSIS — M62552 Muscle wasting and atrophy, not elsewhere classified, left thigh: Secondary | ICD-10-CM | POA: Diagnosis not present

## 2020-12-16 DIAGNOSIS — M25562 Pain in left knee: Secondary | ICD-10-CM | POA: Diagnosis not present

## 2020-12-16 DIAGNOSIS — M25462 Effusion, left knee: Secondary | ICD-10-CM | POA: Diagnosis not present

## 2020-12-18 DIAGNOSIS — Z96652 Presence of left artificial knee joint: Secondary | ICD-10-CM | POA: Diagnosis not present

## 2020-12-19 DIAGNOSIS — M25562 Pain in left knee: Secondary | ICD-10-CM | POA: Diagnosis not present

## 2020-12-19 DIAGNOSIS — M25462 Effusion, left knee: Secondary | ICD-10-CM | POA: Diagnosis not present

## 2020-12-19 DIAGNOSIS — R2689 Other abnormalities of gait and mobility: Secondary | ICD-10-CM | POA: Diagnosis not present

## 2020-12-19 DIAGNOSIS — M62552 Muscle wasting and atrophy, not elsewhere classified, left thigh: Secondary | ICD-10-CM | POA: Diagnosis not present

## 2020-12-19 DIAGNOSIS — Z4733 Aftercare following explantation of knee joint prosthesis: Secondary | ICD-10-CM | POA: Diagnosis not present

## 2020-12-23 DIAGNOSIS — M25462 Effusion, left knee: Secondary | ICD-10-CM | POA: Diagnosis not present

## 2020-12-23 DIAGNOSIS — M62552 Muscle wasting and atrophy, not elsewhere classified, left thigh: Secondary | ICD-10-CM | POA: Diagnosis not present

## 2020-12-23 DIAGNOSIS — M25562 Pain in left knee: Secondary | ICD-10-CM | POA: Diagnosis not present

## 2020-12-23 DIAGNOSIS — Z4733 Aftercare following explantation of knee joint prosthesis: Secondary | ICD-10-CM | POA: Diagnosis not present

## 2020-12-23 DIAGNOSIS — R2689 Other abnormalities of gait and mobility: Secondary | ICD-10-CM | POA: Diagnosis not present

## 2020-12-25 DIAGNOSIS — M25462 Effusion, left knee: Secondary | ICD-10-CM | POA: Diagnosis not present

## 2020-12-25 DIAGNOSIS — R2689 Other abnormalities of gait and mobility: Secondary | ICD-10-CM | POA: Diagnosis not present

## 2020-12-25 DIAGNOSIS — M62552 Muscle wasting and atrophy, not elsewhere classified, left thigh: Secondary | ICD-10-CM | POA: Diagnosis not present

## 2020-12-25 DIAGNOSIS — Z4733 Aftercare following explantation of knee joint prosthesis: Secondary | ICD-10-CM | POA: Diagnosis not present

## 2020-12-25 DIAGNOSIS — M25562 Pain in left knee: Secondary | ICD-10-CM | POA: Diagnosis not present

## 2020-12-29 DIAGNOSIS — M62552 Muscle wasting and atrophy, not elsewhere classified, left thigh: Secondary | ICD-10-CM | POA: Diagnosis not present

## 2020-12-29 DIAGNOSIS — R2689 Other abnormalities of gait and mobility: Secondary | ICD-10-CM | POA: Diagnosis not present

## 2020-12-29 DIAGNOSIS — Z4733 Aftercare following explantation of knee joint prosthesis: Secondary | ICD-10-CM | POA: Diagnosis not present

## 2020-12-29 DIAGNOSIS — M25462 Effusion, left knee: Secondary | ICD-10-CM | POA: Diagnosis not present

## 2020-12-29 DIAGNOSIS — M25562 Pain in left knee: Secondary | ICD-10-CM | POA: Diagnosis not present

## 2020-12-31 DIAGNOSIS — R2689 Other abnormalities of gait and mobility: Secondary | ICD-10-CM | POA: Diagnosis not present

## 2020-12-31 DIAGNOSIS — M62552 Muscle wasting and atrophy, not elsewhere classified, left thigh: Secondary | ICD-10-CM | POA: Diagnosis not present

## 2020-12-31 DIAGNOSIS — M25462 Effusion, left knee: Secondary | ICD-10-CM | POA: Diagnosis not present

## 2020-12-31 DIAGNOSIS — M25562 Pain in left knee: Secondary | ICD-10-CM | POA: Diagnosis not present

## 2020-12-31 DIAGNOSIS — Z4733 Aftercare following explantation of knee joint prosthesis: Secondary | ICD-10-CM | POA: Diagnosis not present

## 2021-01-02 DIAGNOSIS — M62552 Muscle wasting and atrophy, not elsewhere classified, left thigh: Secondary | ICD-10-CM | POA: Diagnosis not present

## 2021-01-02 DIAGNOSIS — M25462 Effusion, left knee: Secondary | ICD-10-CM | POA: Diagnosis not present

## 2021-01-02 DIAGNOSIS — M25562 Pain in left knee: Secondary | ICD-10-CM | POA: Diagnosis not present

## 2021-01-02 DIAGNOSIS — Z4733 Aftercare following explantation of knee joint prosthesis: Secondary | ICD-10-CM | POA: Diagnosis not present

## 2021-01-02 DIAGNOSIS — R2689 Other abnormalities of gait and mobility: Secondary | ICD-10-CM | POA: Diagnosis not present

## 2021-01-05 DIAGNOSIS — Z4733 Aftercare following explantation of knee joint prosthesis: Secondary | ICD-10-CM | POA: Diagnosis not present

## 2021-01-05 DIAGNOSIS — M25462 Effusion, left knee: Secondary | ICD-10-CM | POA: Diagnosis not present

## 2021-01-05 DIAGNOSIS — M62552 Muscle wasting and atrophy, not elsewhere classified, left thigh: Secondary | ICD-10-CM | POA: Diagnosis not present

## 2021-01-05 DIAGNOSIS — M25562 Pain in left knee: Secondary | ICD-10-CM | POA: Diagnosis not present

## 2021-01-05 DIAGNOSIS — R2689 Other abnormalities of gait and mobility: Secondary | ICD-10-CM | POA: Diagnosis not present

## 2021-01-07 DIAGNOSIS — M62552 Muscle wasting and atrophy, not elsewhere classified, left thigh: Secondary | ICD-10-CM | POA: Diagnosis not present

## 2021-01-07 DIAGNOSIS — M25562 Pain in left knee: Secondary | ICD-10-CM | POA: Diagnosis not present

## 2021-01-07 DIAGNOSIS — R2689 Other abnormalities of gait and mobility: Secondary | ICD-10-CM | POA: Diagnosis not present

## 2021-01-07 DIAGNOSIS — M25462 Effusion, left knee: Secondary | ICD-10-CM | POA: Diagnosis not present

## 2021-01-07 DIAGNOSIS — Z4733 Aftercare following explantation of knee joint prosthesis: Secondary | ICD-10-CM | POA: Diagnosis not present

## 2021-01-09 DIAGNOSIS — M62552 Muscle wasting and atrophy, not elsewhere classified, left thigh: Secondary | ICD-10-CM | POA: Diagnosis not present

## 2021-01-09 DIAGNOSIS — M25462 Effusion, left knee: Secondary | ICD-10-CM | POA: Diagnosis not present

## 2021-01-09 DIAGNOSIS — R2689 Other abnormalities of gait and mobility: Secondary | ICD-10-CM | POA: Diagnosis not present

## 2021-01-09 DIAGNOSIS — M25562 Pain in left knee: Secondary | ICD-10-CM | POA: Diagnosis not present

## 2021-01-09 DIAGNOSIS — Z4733 Aftercare following explantation of knee joint prosthesis: Secondary | ICD-10-CM | POA: Diagnosis not present

## 2021-01-12 DIAGNOSIS — R2689 Other abnormalities of gait and mobility: Secondary | ICD-10-CM | POA: Diagnosis not present

## 2021-01-12 DIAGNOSIS — M25462 Effusion, left knee: Secondary | ICD-10-CM | POA: Diagnosis not present

## 2021-01-12 DIAGNOSIS — M62552 Muscle wasting and atrophy, not elsewhere classified, left thigh: Secondary | ICD-10-CM | POA: Diagnosis not present

## 2021-01-12 DIAGNOSIS — M25562 Pain in left knee: Secondary | ICD-10-CM | POA: Diagnosis not present

## 2021-01-12 DIAGNOSIS — Z4733 Aftercare following explantation of knee joint prosthesis: Secondary | ICD-10-CM | POA: Diagnosis not present

## 2021-01-14 DIAGNOSIS — M25462 Effusion, left knee: Secondary | ICD-10-CM | POA: Diagnosis not present

## 2021-01-14 DIAGNOSIS — Z4733 Aftercare following explantation of knee joint prosthesis: Secondary | ICD-10-CM | POA: Diagnosis not present

## 2021-01-14 DIAGNOSIS — R2689 Other abnormalities of gait and mobility: Secondary | ICD-10-CM | POA: Diagnosis not present

## 2021-01-14 DIAGNOSIS — M62552 Muscle wasting and atrophy, not elsewhere classified, left thigh: Secondary | ICD-10-CM | POA: Diagnosis not present

## 2021-01-14 DIAGNOSIS — M25562 Pain in left knee: Secondary | ICD-10-CM | POA: Diagnosis not present

## 2021-01-16 DIAGNOSIS — R2689 Other abnormalities of gait and mobility: Secondary | ICD-10-CM | POA: Diagnosis not present

## 2021-01-16 DIAGNOSIS — Z4733 Aftercare following explantation of knee joint prosthesis: Secondary | ICD-10-CM | POA: Diagnosis not present

## 2021-01-16 DIAGNOSIS — M62552 Muscle wasting and atrophy, not elsewhere classified, left thigh: Secondary | ICD-10-CM | POA: Diagnosis not present

## 2021-01-16 DIAGNOSIS — M25562 Pain in left knee: Secondary | ICD-10-CM | POA: Diagnosis not present

## 2021-01-16 DIAGNOSIS — M25462 Effusion, left knee: Secondary | ICD-10-CM | POA: Diagnosis not present

## 2021-01-20 DIAGNOSIS — R2689 Other abnormalities of gait and mobility: Secondary | ICD-10-CM | POA: Diagnosis not present

## 2021-01-20 DIAGNOSIS — M25562 Pain in left knee: Secondary | ICD-10-CM | POA: Diagnosis not present

## 2021-01-20 DIAGNOSIS — Z4733 Aftercare following explantation of knee joint prosthesis: Secondary | ICD-10-CM | POA: Diagnosis not present

## 2021-01-20 DIAGNOSIS — M62552 Muscle wasting and atrophy, not elsewhere classified, left thigh: Secondary | ICD-10-CM | POA: Diagnosis not present

## 2021-01-20 DIAGNOSIS — M25462 Effusion, left knee: Secondary | ICD-10-CM | POA: Diagnosis not present

## 2021-01-23 DIAGNOSIS — Z4733 Aftercare following explantation of knee joint prosthesis: Secondary | ICD-10-CM | POA: Diagnosis not present

## 2021-01-23 DIAGNOSIS — M62552 Muscle wasting and atrophy, not elsewhere classified, left thigh: Secondary | ICD-10-CM | POA: Diagnosis not present

## 2021-01-23 DIAGNOSIS — R2689 Other abnormalities of gait and mobility: Secondary | ICD-10-CM | POA: Diagnosis not present

## 2021-01-23 DIAGNOSIS — M25462 Effusion, left knee: Secondary | ICD-10-CM | POA: Diagnosis not present

## 2021-01-23 DIAGNOSIS — M25562 Pain in left knee: Secondary | ICD-10-CM | POA: Diagnosis not present

## 2021-02-25 DIAGNOSIS — Z419 Encounter for procedure for purposes other than remedying health state, unspecified: Secondary | ICD-10-CM | POA: Diagnosis not present

## 2021-02-25 DIAGNOSIS — I10 Essential (primary) hypertension: Secondary | ICD-10-CM | POA: Diagnosis not present

## 2021-04-05 ENCOUNTER — Emergency Department (HOSPITAL_COMMUNITY): Payer: PPO

## 2021-04-05 ENCOUNTER — Observation Stay (HOSPITAL_COMMUNITY): Payer: PPO

## 2021-04-05 ENCOUNTER — Other Ambulatory Visit: Payer: Self-pay

## 2021-04-05 ENCOUNTER — Observation Stay (HOSPITAL_COMMUNITY)
Admission: EM | Admit: 2021-04-05 | Discharge: 2021-04-07 | Disposition: A | Discharge status: Home / Self Care | Payer: PPO | Attending: Family Medicine | Admitting: Family Medicine

## 2021-04-05 ENCOUNTER — Encounter (HOSPITAL_COMMUNITY): Payer: Self-pay | Admitting: Emergency Medicine

## 2021-04-05 DIAGNOSIS — K5900 Constipation, unspecified: Secondary | ICD-10-CM | POA: Insufficient documentation

## 2021-04-05 DIAGNOSIS — N401 Enlarged prostate with lower urinary tract symptoms: Secondary | ICD-10-CM

## 2021-04-05 DIAGNOSIS — I119 Hypertensive heart 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: Secondary | ICD-10-CM | POA: Diagnosis not present

## 2021-04-05 DIAGNOSIS — R109 Unspecified abdominal pain: Secondary | ICD-10-CM | POA: Insufficient documentation

## 2021-04-05 DIAGNOSIS — E785 Hyperlipidemia, unspecified: Secondary | ICD-10-CM | POA: Diagnosis not present

## 2021-04-05 DIAGNOSIS — Z20822 Contact with and (suspected) exposure to covid-19: Secondary | ICD-10-CM | POA: Diagnosis not present

## 2021-04-05 DIAGNOSIS — N179 Acute kidney failure, unspecified: Secondary | ICD-10-CM | POA: Diagnosis not present

## 2021-04-05 DIAGNOSIS — Z79899 Other long term (current) drug therapy: Secondary | ICD-10-CM | POA: Diagnosis not present

## 2021-04-05 DIAGNOSIS — R319 Hematuria, unspecified: Secondary | ICD-10-CM | POA: Insufficient documentation

## 2021-04-05 DIAGNOSIS — K5903 Drug induced constipation: Secondary | ICD-10-CM | POA: Diagnosis not present

## 2021-04-05 DIAGNOSIS — D649 Anemia, unspecified: Secondary | ICD-10-CM | POA: Diagnosis not present

## 2021-04-05 DIAGNOSIS — N320 Bladder-neck obstruction: Secondary | ICD-10-CM | POA: Diagnosis not present

## 2021-04-05 DIAGNOSIS — R339 Retention of urine, unspecified: Secondary | ICD-10-CM | POA: Diagnosis present

## 2021-04-05 LAB — URINALYSIS, ROUTINE W REFLEX MICROSCOPIC
Bilirubin Urine: NEGATIVE
Glucose, UA: 50 mg/dL — AB
Ketones, ur: NEGATIVE mg/dL
Leukocytes,Ua: NEGATIVE
Nitrite: NEGATIVE
Protein, ur: NEGATIVE mg/dL
Specific Gravity, Urine: 1.011 (ref 1.005–1.030)
pH: 5 (ref 5.0–8.0)

## 2021-04-05 LAB — CBC
HCT: 36.9 % — ABNORMAL LOW (ref 39.0–52.0)
Hemoglobin: 12.6 g/dL — ABNORMAL LOW (ref 13.0–17.0)
MCH: 28.2 pg (ref 26.0–34.0)
MCHC: 34.1 g/dL (ref 30.0–36.0)
MCV: 82.6 fL (ref 80.0–100.0)
Platelets: 232 10*3/uL (ref 150–400)
RBC: 4.47 MIL/uL (ref 4.22–5.81)
RDW: 13.9 % (ref 11.5–15.5)
WBC: 7.7 10*3/uL (ref 4.0–10.5)
nRBC: 0 % (ref 0.0–0.2)

## 2021-04-05 LAB — RESP PANEL BY RT-PCR (FLU A&B, COVID) ARPGX2
Influenza A by PCR: NEGATIVE
Influenza B by PCR: NEGATIVE
SARS Coronavirus 2 by RT PCR: NEGATIVE

## 2021-04-05 LAB — COMPREHENSIVE METABOLIC PANEL
ALT: 31 U/L (ref 0–44)
AST: 33 U/L (ref 15–41)
Albumin: 3.1 g/dL — ABNORMAL LOW (ref 3.5–5.0)
Alkaline Phosphatase: 85 U/L (ref 38–126)
Anion gap: 17 — ABNORMAL HIGH (ref 5–15)
BUN: 61 mg/dL — ABNORMAL HIGH (ref 8–23)
CO2: 19 mmol/L — ABNORMAL LOW (ref 22–32)
Calcium: 7.7 mg/dL — ABNORMAL LOW (ref 8.9–10.3)
Chloride: 93 mmol/L — ABNORMAL LOW (ref 98–111)
Creatinine, Ser: 8.31 mg/dL — ABNORMAL HIGH (ref 0.61–1.24)
GFR, Estimated: 6 mL/min — ABNORMAL LOW (ref >60)
Glucose, Bld: 121 mg/dL — ABNORMAL HIGH (ref 70–99)
Potassium: 4.7 mmol/L (ref 3.5–5.1)
Sodium: 129 mmol/L — ABNORMAL LOW (ref 135–145)
Total Bilirubin: 1 mg/dL (ref 0.3–1.2)
Total Protein: 6 g/dL — ABNORMAL LOW (ref 6.5–8.1)

## 2021-04-05 LAB — LIPASE, BLOOD: Lipase: 40 U/L (ref 11–51)

## 2021-04-05 MED ORDER — ACETAMINOPHEN 500 MG PO TABS
1000.0000 mg | ORAL_TABLET | Freq: Four times a day (QID) | ORAL | Status: DC | PRN
  Administered 2021-04-05 – 2021-04-07 (×3): 1000 mg via ORAL
  Filled 2021-04-05 (×3): qty 2

## 2021-04-05 MED ORDER — BENAZEPRIL HCL 40 MG PO TABS: 40.0000 mg | ORAL_TABLET | Freq: Every day | ORAL | Status: DC

## 2021-04-05 MED ORDER — POLYETHYLENE GLYCOL 3350 17 G PO PACK
17.0000 g | PACK | Freq: Every day | ORAL | Status: DC
  Administered 2021-04-05 – 2021-04-07 (×3): 17 g via ORAL
  Filled 2021-04-05 (×3): qty 1

## 2021-04-05 MED ORDER — HEPARIN SODIUM (PORCINE) 5000 UNIT/ML IJ SOLN
5000.0000 [IU] | Freq: Three times a day (TID) | INTRAMUSCULAR | Status: DC
  Administered 2021-04-05 – 2021-04-06 (×2): 5000 [IU] via SUBCUTANEOUS
  Filled 2021-04-05 (×3): qty 1

## 2021-04-05 MED ORDER — AMLODIPINE BESYLATE 5 MG PO TABS: 10.0000 mg | ORAL_TABLET | Freq: Every day | ORAL | Status: DC

## 2021-04-05 MED ORDER — LIDOCAINE 5 % EX PTCH
1.0000 | MEDICATED_PATCH | CUTANEOUS | Status: DC
  Administered 2021-04-05 – 2021-04-06 (×2): 1 via TRANSDERMAL
  Filled 2021-04-05 (×2): qty 1

## 2021-04-05 MED ORDER — TAMSULOSIN HCL 0.4 MG PO CAPS
0.4000 mg | ORAL_CAPSULE | Freq: Every day | ORAL | Status: DC
  Administered 2021-04-05 – 2021-04-07 (×3): 0.4 mg via ORAL
  Filled 2021-04-05 (×3): qty 1

## 2021-04-05 NOTE — ED Notes (Signed)
Patient transported to Ultrasound 

## 2021-04-05 NOTE — ED Notes (Signed)
Pt c/o LLQ pain. Admit provider paged to request pain medication

## 2021-04-05 NOTE — ED Triage Notes (Signed)
Pt had R knee replacement on Wednesday.  Reports constipation since Wednesday and decreased urine output.  Reports lower abd pain.

## 2021-04-05 NOTE — ED Provider Notes (Signed)
[facility] EMERGENCY DEPARTMENT Provider Note   CSN: [id number] Arrival date & time: 04/05/21  1324     History  Chief Complaint  Patient presents with   Abdominal Pain   Constipation    Jeremy Fox is a 76 y.o. male.   Abdominal Pain Associated symptoms: constipation   Associated symptoms: no fever   Constipation Associated symptoms: abdominal pain   Associated symptoms: no fever   Patient has history of hypertension, dyslipidemia, BPH, obstructive sleep apnea. Patient presented to the ER for evaluation of constipation and difficulty urinating.  Patient had outpatient knee surgery earlier this week.  Surgery was performed on January 25.  Patient followed up with his orthopedic surgeon on the 26.  Records were reviewed from the office visit at his doctor's office on January 26.  Patient states he has not had a normal bowel meant or is not urinating well since he had his surgery on the 25th.  Patient has felt constipated.  He also feels like he is just dribbling urine and now feels like his bladder is full and not emptying properly.  He has pain in his suprapubic region of his abdomen.  Patient called his doctor's office and was told to come to the emergency room to be evaluated.  Home Medications Prior to Admission medications   Medication Sig Start Date End Date Taking? Authorizing Provider  amLODipine-benazepril (LOTREL) 10-40 MG capsule TAKE ONE CAPSULE BY MOUTH DAILY Patient taking differently: Take 1 capsule by mouth daily. 06/09/20   Park Liter, MD  CHONDROITIN SULFATE PO Take 1,200 mg by mouth daily.    [provider]  cloNIDine (CATAPRES) 0.2 MG tablet TAKE ONE TABLET BY MOUTH DAILY **APPOINTMENT DUE FOR HIGHER DISPENSE NUMBER** Patient not taking: Reported on 10/15/2020 11/16/19   Park Liter, MD  finasteride (PROSCAR) 5 MG tablet Take 5 mg by mouth daily. Patient not taking: Reported on 10/15/2020 04/30/19   [provider]  Glucosamine-Chondroit-Vit C-Mn (GLUCOSAMINE 1500 COMPLEX PO) Take 1 capsule by mouth daily.    [provider]  Multiple Vitamin (MULTIVITAMIN) capsule Take 1 capsule by mouth daily. Unknown strength    [provider]  tamsulosin (FLOMAX) 0.4 MG CAPS capsule Take 1 capsule by mouth daily. Patient not taking: Reported on 10/15/2020 04/30/19   [provider]      Allergies    Sulfa antibiotics and Sulfamethoxazole    Review of Systems   Review of Systems  Constitutional:  Negative for fever.  Gastrointestinal:  Positive for abdominal pain and constipation.   Physical Exam Updated Vital Signs BP (!) 172/73 (BP Location: Left Arm)    Pulse 88    Temp (!) 97.4 F (36.3 C) (Oral)    Resp 16    Ht 1.829 m (6')    Wt 106.6 kg    SpO2 97%    BMI 31.87 kg/m  Physical Exam Vitals and nursing note reviewed.  Constitutional:      General: He is not in acute distress.    Appearance: He is well-developed.  HENT:     Head: Normocephalic and atraumatic.     Right Ear: External ear normal.     Left Ear: External ear normal.  Eyes:     General: No scleral icterus.       Right eye: No discharge.        Left eye: No discharge.     Conjunctiva/sclera: Conjunctivae normal.  Neck:  Trachea: No tracheal deviation.  Cardiovascular:     Rate and Rhythm: Normal rate and regular rhythm.  Pulmonary:     Effort: Pulmonary effort is normal. No respiratory distress.     Breath sounds: Normal breath sounds. No stridor. No wheezing or rales.  Abdominal:     General: Abdomen is protuberant. Bowel sounds are normal. There is no distension.     Palpations: Abdomen is soft.     Tenderness: There is abdominal tenderness in the suprapubic area. There is no guarding or rebound.  Genitourinary:    Comments: No fecal impaction noted on digital rectal exam Musculoskeletal:        General: No tenderness or deformity.     Cervical back: Neck supple.     Comments:  Status post total right knee replacement, ecchymoses and swelling noted around the right thigh and knee  Skin:    General: Skin is warm and dry.     Findings: No rash.  Neurological:     General: No focal deficit present.     Mental Status: He is alert.     Cranial Nerves: No cranial nerve deficit (no facial droop, extraocular movements intact, no slurred speech).     Sensory: No sensory deficit.     Motor: No abnormal muscle tone or seizure activity.     Coordination: Coordination normal.  Psychiatric:        Mood and Affect: Mood normal.    ED Results / Procedures / Treatments   Labs (all labs ordered are listed, but only abnormal results are displayed) Labs Reviewed  COMPREHENSIVE METABOLIC PANEL - Abnormal; Notable for the following components:      Result Value   Sodium 129 (*)    Chloride 93 (*)    CO2 19 (*)    Glucose, Bld 121 (*)    BUN 61 (*)    Creatinine, Ser 8.31 (*)    Calcium 7.7 (*)    Total Protein 6.0 (*)    Albumin 3.1 (*)    GFR, Estimated 6 (*)    Anion gap 17 (*)    All other components within normal limits  CBC - Abnormal; Notable for the following components:   Hemoglobin 12.6 (*)    HCT 36.9 (*)    All other components within normal limits  LIPASE, BLOOD  URINALYSIS, ROUTINE W REFLEX MICROSCOPIC    EKG None  Radiology DG Abdomen Acute W/Chest  Result Date: 04/05/2021 CLINICAL DATA:  Constipation EXAM: DG ABDOMEN ACUTE WITH 1 VIEW CHEST COMPARISON:  Chest CT 10/03/2020, chest radiograph 09/18/2020 FINDINGS: Chest: The cardiomediastinal silhouette is normal. There is a small left pleural effusion with adjacent atelectasis or infection. There is no other focal consolidation. There is no pulmonary edema. There is no right effusion. There is no pneumothorax Abdomen: There is mild gaseous distention of the small and large bowel throughout the abdomen without evidence of mechanical obstruction. There is no abnormally large colonic stool burden. There  is no gross organomegaly or abnormal soft tissue calcification. There is no free intraperitoneal air. There is no acute osseous abnormality. IMPRESSION: 1. Mild gaseous distention of the small and large bowel without evidence of mechanical obstruction. No abnormally large colonic stool burden. 2. Small left pleural effusion with adjacent atelectasis or infection. Electronically Signed   By: Valetta Mole M.D.   On: 04/05/2021 15:19    Procedures Procedures    Medications Ordered in ED Medications - No data to display  ED Course/ Medical  Decision Making/ A&P Clinical Course as of 04/05/21 1545  Sun Apr 05, 2021  1516 CBC(!) CBC is normal [JK]  1517 Comprehensive metabolic panel(!) Metabolic panel shows acute renal failure with BUN and creatinine of 61 and 8.3.  Patient's renal function was normal 2 years ago [JK]  1543 Discussed with Dr Danise Edge urology.  Will consult on patient.  [WG]  9562 Discussed with  [ZH]  0865 DIscussed with IM service regarding admission [JK]    Clinical Course User Index [JK] Dorie Rank, MD                           Medical Decision Making Amount and/or Complexity of Data Reviewed Labs: ordered. Decision-making details documented in ED Course. Radiology: ordered.  Risk Decision regarding hospitalization.   Constipation Patient with complaints of constipation.  No findings of fecal impaction on exam.  Bowel obstruction is a consideration however is not having any vomiting and this is less likely.  Suspect this is related to his opiate use from his knee surgery.  We will proceed with abdominal films and laboratory tests  Dysuria Patient states he is just urinating small amounts.  We will evaluate for renal failure and his laboratory test.  We will also proceed with bladder scan to rule out bladder outlet obstruction which is a possibility after his recent procedures.  Urinalysis will also be sent to evaluate for infection.  Patient's bladder scan shows  greater than 700 cc of urine.  Patient will require Foley catheter.  I have ordered Foley catheter nurses with placing that.  Acute failure. Likely a result of his bladder outlet obstruction.  We will proceed with Foley catheter insertion I will order a renal ultrasound.  No hyperkalemia patient does not have any breathing difficulty.  No indication for emergent dialysis.  He will likely improve with resolving his bladder outlet obstruction I will consult with urology and the patient will require admission to the hospital the medical service for further treatment.         Final Clinical Impression(s) / ED Diagnoses Final diagnoses:  Bladder outlet obstruction  Acute renal failure, unspecified acute renal failure type ([place])  Drug-induced constipation     Dorie Rank, MD 04/05/21 1545

## 2021-04-05 NOTE — ED Notes (Signed)
[place] notified pt is ready for transport

## 2021-04-05 NOTE — H&P (Addendum)
[facility] Admission History and Physical Service Pager: [phone]  Patient name: Jeremy Fox Medical record number: [id number] Date of birth: 1945-12-16 Age: 76 y.o. Gender: male  Primary Care Provider: Garnetta Buddy I, NP Consultants: Urology Code Status: Full  Preferred Emergency Contact: Hrithik Boschee (son) [phone]  Chief Complaint: Constipation and difficulty urinating  Assessment and Plan: NEDIM OKI is a 76 y.o. male presenting with problems urinating and constipation . PMH is significant for HTN, HLD, OA, BPH.   Acute Renal Failure 2/2 urinary retention/obstruction  Patient has had difficulty urinating since R total knee arthroplasty surgery on 04/01/21. Reports dribbling when trying to urinate, with bladder fullness. Patient with history of BPH on flomax and finasteride per chart review, reports not having a strong urination stream at baseline. Patient contacted doctor's office about issues and was sent to ED. On arrival to ED patient was afebrile with BP 172/73 with pulse 88, RR 16 sating 97% on RA. Labs were significant for a normal lipase, Na 129, Cr 8.31 (baseline 1.22 in 2020), BUN 61, GFR 6, and anion gap 17. In the ED bladder scan showed greater than 700 cc urine, foley catheter placed with around 1300 cc output. Urology was consulted. Differential includes obstructive uropathy 2/2 BPH, stones, or malignancy. Given his history BPH is most likely. He also could be retaining 2/2 the anesthesia from his surgery, all though less likely given he is 4 days out.  -Admit to [facility], Attending Dr. Camillo Flaming  -Urology following, appreciate recs - vitals per floor -UA, Ucx, Renal U/S - AM BMP to monitor kidney function -Foley catheter placed -Start Flomax 0.4 mg daily - Heparin 5000 units TID for DVT prophylaxis   Constipation    Abdominal pain s/p knee surgery 04/01/21 Patient with abdominal pain and no BM and producing little urine since  surgery on 04/01/21.  Abdominal imaging showing mild gaseous distention of the small and large bowel without evidence of mechanical obstruction. No abnormally large colonic stool burden. Patient may be suffering from post-op ileus vs opoid induced constipation. -Miralax 17g daily  HTN Patient BP 172/73 on arrival, home meds include amlodipine-benazeparil 10-40 mg daily. -Hold home meds in setting of acute renal failure  HLD Patient last lipid panel was 05/01/18, see bleow    Component Value Date/Time   CHOL 173 05/01/2018 0803   TRIG 73 05/01/2018 0803   HDL 49 05/01/2018 0803   CHOLHDL 3.5 05/01/2018 0803   LDLCALC 109 (H) 05/01/2018 0803   LABVLDL 15 05/01/2018 0803  -Lipid panel -Consider starting statin  BPH Per chart review, patent taking Flomax and Finasteride, neither noted on med reconciliation. States in 2013 had a biopsy which was normal -Start Flomax 0.4 mg daily -Continue to monitor UOP  OA   Total knee arthroplasty  Patient has had two knee replacement surgeries, L knee in October 2022, R knee Jan 2023. Completed opiod course yesterday. - Tylenol 1000mg  q 6 h prn   FEN/GI: Regular diet Prophylaxis: Heparin  Disposition: Admit to FPTS  History of Present Illness:  Jeremy Fox is a 76 y.o. male presenting with constipation, abdominal pain, urinary retention  Review Of Systems: Per HPI with the following additions:   Patient had L. Knee surgery in October, and had R knee surgery Wednsday January 25th 2023. States he could not urinate or have a BM since the surgery. Had a little dribble with urination. Does endorse history of having some difficulty urinating due to his  enlarged prostate. States he was being seen by [facility], had appointment scheduled but instead came to the hospital because it got so bad. Son also bedside helping to provide history, states urination wasn't normal before the surgery but was able to urinate. Last BM Wed before his surgery which  was normal formed stool, non bloody.   Denies fever, chills, nausea, vomiting. Does endorse some headaches over the last couple of days. Endorses abdominal pressure that has improved since being foley catheter placed and bladder drained. States his appetite has decreased after the surgery. Patient states that he was on Oxycodone for pain after surgery, last finished it yesterday. Requesting ice pack for his knee.   Drinks a glass of wine daily before bed, denies tobacco use, denies recreational drugs   Review of Systems   Patient Active Problem List   Diagnosis Date Noted   Preop cardiovascular exam 10/15/2020   Benign prostatic hyperplasia 10/14/2020   Class 1 obesity due to excess calories with serious comorbidity and body mass index (BMI) of 31.0 to 31.9 in adult 09/18/2020   Onychomycosis of right great toe 06/07/2019   Prominent metatarsal head, right 06/07/2019   S/P arthroscopy of right shoulder 06/05/2019   Chronic pain of both knees 05/08/2019   Chronic right shoulder pain 05/08/2019   Elevated PSA 09/05/2018   Benign prostatic hyperplasia with urinary frequency 09/05/2018   Memory loss 09/05/2018   OSA (obstructive sleep apnea) 03/29/2016   Essential hypertension 06/10/2015   Dyslipidemia 06/03/2015   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/03/2015   OA (osteoarthritis) of knee 06/03/2015   Rotator cuff syndrome of left shoulder 06/03/2015   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    Past Medical History: Past Medical History:  Diagnosis Date   Benign prostatic hyperplasia    Per records received from [facility], also in care everywhere   Dyslipidemia    Elevated PSA    Per records received from [facility], also in care everywhere   Essential hypertension    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/03/2015   OA (osteoarthritis) of knee 06/03/2015   OSA (obstructive sleep apnea)    Rotator cuff syndrome of left shoulder 06/03/2015   Ventricular  bigeminy 06/03/2015    Past Surgical History: Past Surgical History:  Procedure Laterality Date   KNEE SURGERY     KNEE SURGERY Right     Social History: Social History   Tobacco Use   Smoking status: Never   Smokeless tobacco: Never  Vaping Use   Vaping Use: Never used  Substance Use Topics   Alcohol use: Never   Drug use: No   Additional social history:  Drinks a small glass of wine a night, last drink was yesterday.  Please also refer to relevant sections of EMR.  Family History: Family History  Problem Relation Age of Onset   Heart attack Father    Heart disease Father    Diabetes Brother      Allergies and Medications: Allergies  Allergen Reactions   Sulfa Antibiotics Rash   Sulfamethoxazole Rash   No current facility-administered medications on file prior to encounter.   Current Outpatient Medications on File Prior to Encounter  Medication Sig Dispense Refill   amLODipine-benazepril (LOTREL) 10-40 MG capsule TAKE ONE CAPSULE BY MOUTH DAILY (Patient taking differently: Take 1 capsule by mouth daily.) 90 capsule 2   CHONDROITIN SULFATE PO Take 1,200 mg by mouth daily.     cloNIDine (CATAPRES) 0.2 MG tablet TAKE ONE TABLET  BY MOUTH DAILY **APPOINTMENT DUE FOR HIGHER DISPENSE NUMBER** (Patient not taking: Reported on 10/15/2020) 90 tablet 3   finasteride (PROSCAR) 5 MG tablet Take 5 mg by mouth daily. (Patient not taking: Reported on 10/15/2020)     Glucosamine-Chondroit-Vit C-Mn (GLUCOSAMINE 1500 COMPLEX PO) Take 1 capsule by mouth daily.     Multiple Vitamin (MULTIVITAMIN) capsule Take 1 capsule by mouth daily. Unknown strength     tamsulosin (FLOMAX) 0.4 MG CAPS capsule Take 1 capsule by mouth daily. (Patient not taking: Reported on 10/15/2020)      Objective: BP (!) 172/73 (BP Location: Left Arm)    Pulse 88    Temp (!) 97.4 F (36.3 C) (Oral)    Resp 16    Ht 6' (1.829 m)    Wt 106.6 kg    SpO2 97%    BMI 31.87 kg/m  Exam: General: Well appearing,  conversational, polite, NAD, white male Eyes: EOMI, PERRL ENTM: MMM Neck: Soft Cardiovascular: RRR, NRMG Respiratory: CTABL Gastrointestinal: Soft, minimal suprapubic tenderness to palpation, non-distended MSK: Moving all extremities independently Derm: Brusing on left forearm and right thigh Psych: Good mood/affect  Labs and Imaging: CBC BMET  Recent Labs  Lab 04/05/21 1355  WBC 7.7  HGB 12.6*  HCT 36.9*  PLT 232   Recent Labs  Lab 04/05/21 1355  NA 129*  K 4.7  CL 93*  CO2 19*  BUN 61*  CREATININE 8.31*  GLUCOSE 121*  CALCIUM 7.7*     EKG: None performed   DG Abdomen Acute W/Chest  Result Date: 04/05/2021 CLINICAL DATA:  Constipation EXAM: DG ABDOMEN ACUTE WITH 1 VIEW CHEST COMPARISON:  Chest CT 10/03/2020, chest radiograph 09/18/2020 FINDINGS: Chest: The cardiomediastinal silhouette is normal. There is a small left pleural effusion with adjacent atelectasis or infection. There is no other focal consolidation. There is no pulmonary edema. There is no right effusion. There is no pneumothorax Abdomen: There is mild gaseous distention of the small and large bowel throughout the abdomen without evidence of mechanical obstruction. There is no abnormally large colonic stool burden. There is no gross organomegaly or abnormal soft tissue calcification. There is no free intraperitoneal air. There is no acute osseous abnormality. IMPRESSION: 1. Mild gaseous distention of the small and large bowel without evidence of mechanical obstruction. No abnormally large colonic stool burden. 2. Small left pleural effusion with adjacent atelectasis or infection. Electronically Signed   By: Valetta Mole M.D.   On: 04/05/2021 15:19     Holley Bouche, MD 04/05/2021, 3:44 PM PGY-1, [place] Intern pager: [phone], text pages welcome  FPTS Upper-Level Resident Addendum   I have independently interviewed and examined the patient. I have discussed the above with the  original author and agree with their documentation. My edits for correction/addition/clarification are included. Please see also any attending notes.   Sonia Side, D.O. PGY-2, [facility] 04/05/2021 7:37 PM  [place] Service pager: [phone] (text pages welcome through [facility])

## 2021-04-05 NOTE — ED Notes (Signed)
Pt stated he has taken colace, milk of magnesia, and other laxatives for relief but no bm.

## 2021-04-05 NOTE — ED Notes (Signed)
Received verbal report from Alison D RN at this time 

## 2021-04-05 NOTE — Progress Notes (Signed)
FPTS Brief Progress Note  S: Complained of L groin pain and constipation but otherwise okay   O: BP (!) 155/76    Pulse 92    Temp (!) 97.4 F (36.3 C) (Oral)    Resp 18    Ht 6' (1.829 m)    Wt 106.6 kg    SpO2 98%    BMI 31.87 kg/m    Gen: NAD, laying in bed Resp: no iWOB Abd: distended, nontender L groin, no masses, worse pain when coughing  A/P: Acute Renal Failure 2/2 Urinary Retention/Obstruction  Monitor I's and O's Plan per H&P Left Groin Pain Worse with coughing and position changes.  -consider additional imaging if worsening pain for hernia r/o -lidocaine patch, cut for groin area -avoiding NSAIDs -tylenol 1000 mg q6h prn - Orders reviewed. Labs for AM ordered, which was adjusted as needed.   Gerrit Heck, MD 04/05/2021, 7:35 PM PGY-1, [facility] Night Resident  Please page [phone] with questions.

## 2021-04-05 NOTE — Consult Note (Signed)
Urology Consult Note   Requesting Attending Physician:  Lenoria Chime, MD Service Providing Consult: Urology  Consulting Attending: Link Snuffer, MD   Reason for Consult:  Urinary retention  HPI: Jeremy Fox is seen in consultation for reasons noted above at the request of Lenoria Chime, MD for evaluation of urinary retention.  This is a 76 y.o. male with PMHOSA, OA, HTN, HLD, BPH and elevated PSA who follows with Dr. Tresa Moore and was last seen on 04/30/19.  Presents one week after orthopedic right arthoplastic knee surgery on 04/01/21. Trouble urinating at home. States he hasnt urinated at all since Wednesday, only dribbling. and presented with dribbling and distention. Catheterized for 1.3L urine.  Patient also takes finasteride and flomax.   When seen in clinic one year ago PVR was 31cc. Started on finasterid eand flomax then.   Patient is afebrile and HDS. Creatinine is 8.3 from baseline 1.2. Hyponatremic to 129. UA with small Hb and rare bacteria.    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    Past Surgical History:  Past Surgical History:  Procedure Laterality Date   KNEE SURGERY     KNEE SURGERY Right     Medication: Current Facility-Administered Medications  Medication Dose Route Frequency Provider Last Rate Last Admin   acetaminophen (TYLENOL) tablet 1,000 mg  1,000 mg Oral Q6H PRN Holley Bouche, MD   1,000 mg at 04/05/21 2017   heparin injection 5,000 Units  5,000 Units Subcutaneous Q8H Holley Bouche,  MD   5,000 Units at 04/05/21 1855   polyethylene glycol (MIRALAX / GLYCOLAX) packet 17 g  17 g Oral Daily Paige, Victoria J, DO   17 g at 04/05/21 1938   tamsulosin (FLOMAX) capsule 0.4 mg  0.4 mg Oral Daily Holley Bouche, MD   0.4 mg at 04/05/21 8185   Current Outpatient Medications  Medication Sig Dispense Refill   acetaminophen (TYLENOL) 500 MG tablet Take 1,000 mg by mouth every 6 (six) hours as needed for mild pain.     amLODipine-benazepril (LOTREL) 10-40 MG capsule TAKE ONE CAPSULE BY MOUTH DAILY (Patient taking differently: Take 1 capsule by mouth daily.) 90 capsule 2   aspirin 325 MG EC tablet Take 325 mg by mouth daily.     methocarbamol (ROBAXIN) 500 MG tablet Take 500-1,000 mg by mouth 4 (four) times daily as needed for muscle spasms.     oxyCODONE (OXY IR/ROXICODONE) 5 MG immediate release tablet Take 5-10 mg by mouth every 6 (six) hours as needed for pain.     cloNIDine (CATAPRES) 0.2 MG tablet TAKE ONE TABLET BY MOUTH DAILY **APPOINTMENT DUE FOR HIGHER DISPENSE NUMBER** (Patient not taking: Reported on 10/15/2020) 90 tablet 3    Allergies: Allergies  Allergen Reactions   Sulfa Antibiotics Rash   Sulfamethoxazole Rash    Social History: Social History   Tobacco Use   Smoking status: Never   Smokeless tobacco: Never  Vaping Use   Vaping Use: Never used  Substance Use Topics  Alcohol use: Never   Drug use: No    Family History Family History  Problem Relation Age of Onset   Heart attack Father    Heart disease Father    Diabetes Brother     Review of Systems 10 systems were reviewed and are negative except as noted specifically in the HPI.  Objective   Vital signs in last 24 hours: BP (!) 171/67 (BP Location: Right Arm)    Pulse 94    Temp (!) 97.5 F (36.4 C) (Oral)    Resp 18    Ht 6' (1.829 m)    Wt 106.6 kg    SpO2 97%    BMI 31.87 kg/m   Physical Exam General: NAD, A&O, resting, appropriate HEENT: [place]/AT, EOMI, MMM Pulmonary: Normal work of  breathing Cardiovascular: HDS, adequate peripheral perfusion Abdomen: Soft, NTTP, nondistended. GU: foley in place draining tea colored urine Extremities: warm and well perfused Neuro: Appropriate, no focal neurological deficits  Most Recent Labs: Lab Results  Component Value Date   WBC 7.7 04/05/2021   HGB 12.6 (L) 04/05/2021   HCT 36.9 (L) 04/05/2021   PLT 232 04/05/2021    Lab Results  Component Value Date   NA 129 (L) 04/05/2021   K 4.7 04/05/2021   CL 93 (L) 04/05/2021   CO2 19 (L) 04/05/2021   BUN 61 (H) 04/05/2021   CREATININE 8.31 (H) 04/05/2021   CALCIUM 7.7 (L) 04/05/2021    No results found for: INR, APTT   Urine Culture: @[id number] (laburin,org,[id number])@   IMAGING: US Renal  Result Date: 04/05/2021 CLINICAL DATA:  Bladder outlet obstruction, renal failure. EXAM: RENAL / URINARY TRACT ULTRASOUND COMPLETE COMPARISON:  None. FINDINGS: Right Kidney: Renal measurements: 12.6 x 6.0 x 6.1 cm = volume: 237 mL. Echogenicity within normal limits. No mass or hydronephrosis visualized. There is a 9 x 8 x 11 mm cyst. Left Kidney: Renal measurements: 11.6 x 4.7 x 5.2 cm = volume: 146 mL. Echogenicity within normal limits. No mass or hydronephrosis visualized. Lobulated irregular contour. There is a 3.1 x 2.4 x 2.4 cm cyst. Bladder: Under distended and not well evaluated.  Foley catheter is present. Other: Limited secondary to body habitus. IMPRESSION: 1. No hydronephrosis. 2. Lobulated left renal contour may be related to chronic scarring. 3. Bilateral renal cysts. 4. Foley catheter in the bladder. Electronically Signed   By: Ronney Asters M.D.   On: 04/05/2021 19:11   DG Abdomen Acute W/Chest  Result Date: 04/05/2021 CLINICAL DATA:  Constipation EXAM: DG ABDOMEN ACUTE WITH 1 VIEW CHEST COMPARISON:  Chest CT 10/03/2020, chest radiograph 09/18/2020 FINDINGS: Chest: The cardiomediastinal silhouette is normal. There is a small left pleural effusion with adjacent atelectasis or  infection. There is no other focal consolidation. There is no pulmonary edema. There is no right effusion. There is no pneumothorax Abdomen: There is mild gaseous distention of the small and large bowel throughout the abdomen without evidence of mechanical obstruction. There is no abnormally large colonic stool burden. There is no gross organomegaly or abnormal soft tissue calcification. There is no free intraperitoneal air. There is no acute osseous abnormality. IMPRESSION: 1. Mild gaseous distention of the small and large bowel without evidence of mechanical obstruction. No abnormally large colonic stool burden. 2. Small left pleural effusion with adjacent atelectasis or infection. Electronically Signed   By: Valetta Mole M.D.   On: 04/05/2021 15:19    ------  Assessment:  76 y.o. male with PMHOSA, OA, HTN, HLD, BPH and elevated  PSA ing wit hurinary retention s/p right arthroscopic knee surgery on 04/01/21. Severe AKI with creatinine to 8 from baseline 1.2.   RUS with catheter in place reveals no hydronephrosis.   Recommend continuing finasteride and flomax and keeping catheterin place for at least one week due to significant bladder stretch injury. We will arrange for outpatient TOV and follow up for BPH.   In addition, would monitor Ins and outs due to risk of post-obstructive diuresis. Fluid resuscitation per primary team is appreciated as needed. Recommend trending electrolytes and monitoring for restoration of renal function.    Recommendations: - Continue foley at least one week due to concern fo rbaldder stretch injury - Continue flomax and finasteride - Follow up urine culture - Monitor renal function, observe for post-obstructive diuresis - Will arrange for outpatient TOV and BPH follow up.    Thank you for this consult. Please contact the urology consult pager with any further questions/concerns.

## 2021-04-06 DIAGNOSIS — R339 Retention of urine, unspecified: Secondary | ICD-10-CM

## 2021-04-06 LAB — URINE CULTURE: Culture: NO GROWTH

## 2021-04-06 LAB — CBC
HCT: 33.3 % — ABNORMAL LOW (ref 39.0–52.0)
Hemoglobin: 11.5 g/dL — ABNORMAL LOW (ref 13.0–17.0)
MCH: 28.2 pg (ref 26.0–34.0)
MCHC: 34.5 g/dL (ref 30.0–36.0)
MCV: 81.6 fL (ref 80.0–100.0)
Platelets: 218 10*3/uL (ref 150–400)
RBC: 4.08 MIL/uL — ABNORMAL LOW (ref 4.22–5.81)
RDW: 14 % (ref 11.5–15.5)
WBC: 5.5 10*3/uL (ref 4.0–10.5)
nRBC: 0 % (ref 0.0–0.2)

## 2021-04-06 LAB — BASIC METABOLIC PANEL
Anion gap: 10 (ref 5–15)
BUN: 41 mg/dL — ABNORMAL HIGH (ref 8–23)
CO2: 24 mmol/L (ref 22–32)
Calcium: 7.9 mg/dL — ABNORMAL LOW (ref 8.9–10.3)
Chloride: 104 mmol/L (ref 98–111)
Creatinine, Ser: 3.85 mg/dL — ABNORMAL HIGH (ref 0.61–1.24)
GFR, Estimated: 15 mL/min — ABNORMAL LOW (ref >60)
Glucose, Bld: 104 mg/dL — ABNORMAL HIGH (ref 70–99)
Potassium: 4.4 mmol/L (ref 3.5–5.1)
Sodium: 138 mmol/L (ref 135–145)

## 2021-04-06 LAB — DIC (DISSEMINATED INTRAVASCULAR COAGULATION)PANEL
D-Dimer, Quant: 5.05 ug/mL-FEU — ABNORMAL HIGH (ref 0.00–0.50)
Fibrinogen: 645 mg/dL — ABNORMAL HIGH (ref 210–475)
INR: 1.2 (ref 0.8–1.2)
Platelets: 218 10*3/uL (ref 150–400)
Prothrombin Time: 14.9 seconds (ref 11.4–15.2)
Smear Review: NONE SEEN
aPTT: 36 seconds (ref 24–36)

## 2021-04-06 LAB — LIPID PANEL
Cholesterol: 144 mg/dL (ref 0–200)
HDL: 45 mg/dL (ref >40)
LDL Cholesterol: 87 mg/dL (ref 0–99)
Total CHOL/HDL Ratio: 3.2 RATIO
Triglycerides: 61 mg/dL (ref <150)
VLDL: 12 mg/dL (ref 0–40)

## 2021-04-06 MED ORDER — CHLORHEXIDINE GLUCONATE CLOTH 2 % EX PADS
6.0000 | MEDICATED_PAD | Freq: Every day | CUTANEOUS | Status: DC
Start: 2021-04-06 — End: 2021-04-07
  Administered 2021-04-06 – 2021-04-07 (×2): 6 via TOPICAL

## 2021-04-06 MED ORDER — AMLODIPINE BESYLATE 10 MG PO TABS
10.0000 mg | ORAL_TABLET | Freq: Every day | ORAL | Status: DC
  Administered 2021-04-06 – 2021-04-07 (×2): 10 mg via ORAL
  Filled 2021-04-06 (×2): qty 1

## 2021-04-06 NOTE — Progress Notes (Signed)
FPTS Brief Progress Note  S: Patient was resting comfortably in bed.  Son thought he had some irritation with his bandage but otherwise was doing well.  Says he has had 2 bowel movements and feels relief from this.  Denied any pain and eager to go home early tomorrow.   O: BP (!) 153/72 (BP Location: Left Arm)    Pulse 95    Temp 97.8 F (36.6 C) (Oral)    Resp 16    Ht 6' (1.829 m)    Wt 106.6 kg    SpO2 96%    BMI 31.87 kg/m   GEN: Resting comfortably in bed Resp: No increased work of breathing Bruising present on the right lower extremity near thigh.  Moving extremity well cap refill brisk in bilateral lower extremity.  A/P: Heparin was DC'd today Amlodipine was added, monitoring blood pressures tonight Plan per day team - Orders reviewed. Labs for AM ordered, which was adjusted as needed.   Gerrit Heck, MD 04/06/2021, 9:14 PM PGY-1, [facility] Night Resident  Please page [phone] with questions.

## 2021-04-06 NOTE — Hospital Course (Addendum)
Jeremy Fox is a 76 y.o. male presenting with problems urinating and constipation . PMH is significant for HTN, HLD, OA, BPH. His hospital course is below by problem.   Acute Renal Failure 2/2 urinary retention/obstruction  Patient has had difficulty urinating since R total knee arthroplasty surgery on 04/01/21. Reports dribbling when trying to urinate, with bladder fullness. Patient with history of BPH on flomax and finasteride per chart review, reports not having a strong urination stream at baseline. Patient contacted doctor's office about issues and was sent to ED. On arrival to ED patient was afebrile with BP 172/73 with pulse 88, RR 16 sating 97% on RA. Labs were significant for a normal lipase, Na 129, Cr 8.31 (baseline 1.22 in 2020), BUN 61, GFR 6, and anion gap 17. In the ED bladder scan showed greater than 700 cc urine, foley catheter placed with around 1300 cc output. Urology was consulted. His Cr continued to improve, also with improvement of GFR and BUN. He continued to have good UOP during admission. By discharge patient Cr was was normalized. Urology recommended patient discharge with foley and would follow up with them in outpatient setting.  Constipation    Abdominal pain s/p knee surgery 04/01/21 Patient with abdominal pain and no BM and producing little urine since surgery on 04/01/21.  Abdominal imaging showing mild gaseous distention of the small and large bowel without evidence of mechanical obstruction. Patient was started on miralax 17 g daily, and also received soap suds enema. By discharge patient had improved bowel function, and had 2 bowel movements.  Hematuria with concerning petechial rash Hematuria was appreciated in his  foley morning after catheter placement, may be 2/2 traumatic foley insertion in setting of BPH. Patient also had concerning rash that looked petechial on trunk/abdomen. Urology was notified and had no concern for patients hematuria at this time.   BPH Per  chart review, patent taking Flomax and Finasteride, neither noted on med reconciliation. States in 2013 had a biopsy which was normal. Patient was started on Flomax 0.4 mg daily  All other conditions were chronic and sable HTN HLD OA  Issues for follow up: Consider Finasteride in outpatient setting

## 2021-04-06 NOTE — Progress Notes (Addendum)
Family Medicine Teaching Service Daily Progress Note Intern Pager: [phone]  Patient name: Jeremy Fox Medical record number: [id number] Date of birth: 25-Nov-1945 Age: 76 y.o. Gender: male  Primary Care Provider: Garnetta Buddy I, NP Consultants: Nephrology Code Status: Full  Pt Overview and Major Events to Date:  04/05/21  -  Patient admitted  Assessment and Plan:  Jeremy Fox is a 76 y.o. male presenting with problems urinating and constipation . PMH is significant for HTN, HLD, OA, BPH.    Acute Renal Failure 2/2 urinary retention/obstruction  Cr improved to 3.85 from 8.31 yesterday, with BUN 41 from 61 yesterday, and GFR 15 from 6 yesterday. UOP 5.7 L yesterday. UA was benign with small hgb and 50 glucose, Ucx with NGTD, Renal U/S was benign showing no hydronephrosis, lobulated left renal contour may be related to chronic scarring, and bilateral renal cysts. Patient renal status continues to improve.  -Consider voiding trial today tomorrow -AM BMP -Continue foley catheter -F/u Ucx  -Continue Flomax 0.4 mg daily  Hematuria with concerning petechial rash Hematuria was appreciated in his  foley this morning, may be 2/2 traumatic foley insertion in setting of BPH. Patient also had concerning rash that looked petechial on trunk/abdomen. Urology was notified and had no concern for patients hematuria at this time.  -Stop Heparin -Start SCDs -Notified Urology -AM CBC  Constipation    Abdominal pain s/p knee surgery 04/01/21 Patient continues to complain of constipation, no large stool burden noted on ABD x-ray. Will give enema today.  -Continue mirlax 17 g daily -Soap suds enema  L. Groin pain Patient complaining of left groin pain overnight, received lidocaine patch. Will continue to monitor  HTN BP range 160-170's/60-80's, stable. Will hold home meds until kidney function stabilizes. -Start amlodipine 10 mg daily -Hold home antihypertensives in setting of acute renal  failure -Continue to monitor  HLD Lipid Panel     Component Value Date/Time   CHOL 144 04/06/2021 0121   CHOL 173 05/01/2018 0803   TRIG 61 04/06/2021 0121   HDL 45 04/06/2021 0121   HDL 49 05/01/2018 0803   CHOLHDL 3.2 04/06/2021 0121   VLDL 12 04/06/2021 0121   LDLCALC 87 04/06/2021 0121   LDLCALC 109 (H) 05/01/2018 0803   LABVLDL 15 05/01/2018 0803  -No statin necessary at this time.    BPH -Continue Flomax 0.4 mg daily  FEN/GI: Regular diet PPx: Heparin Dispo:Home tomorrow.   Subjective:  No complaints this morning, feeling well, still wanting to have BM  Objective: Temp:  [97.4 F (36.3 C)-98.2 F (36.8 C)] 97.6 F (36.4 C) (01/30 0246) Pulse Rate:  [77-94] 94 (01/30 0246) Resp:  [16-18] 18 (01/30 0246) BP: (146-189)/(60-81) 170/77 (01/30 0246) SpO2:  [96 %-98 %] 97 % (01/30 0246) Weight:  [106.6 kg] 106.6 kg (01/29 1418) Physical Exam: General: Well appearing, jovial, chatty, NAD, white male Cardiovascular: RRR, [place] Respiratory: CTABL Abdomen: Soft, NTTP, Non-distended, rash on abdomen/trunk concerning for petechia Extremities: Moving all extremities independently, no edema, compression leggings on BLE, with wrap on RLE  Laboratory: Recent Labs  Lab 04/05/21 1355 04/06/21 0121  WBC 7.7 5.5  HGB 12.6* 11.5*  HCT 36.9* 33.3*  PLT 232 218   Recent Labs  Lab 04/05/21 1355 04/06/21 0121  NA 129* 138  K 4.7 4.4  CL 93* 104  CO2 19* 24  BUN 61* 41*  CREATININE 8.31* 3.85*  CALCIUM 7.7* 7.9*  PROT 6.0*  --   BILITOT 1.0  --  ALKPHOS 85  --   ALT 31  --   AST 33  --   GLUCOSE 121* 104*      Imaging/Diagnostic Tests:   Holley Bouche, MD 04/06/2021, 6:33 AM PGY-1, [place] Intern pager: [phone], text pages welcome

## 2021-04-06 NOTE — TOC Initial Note (Signed)
Transition of Care [facility]) - Initial/Assessment Note    Patient Details  Name: Jeremy Fox MRN: [id number] Date of Birth: 11/21/1945  [facility] of [facility]) CM/SW Contact:    Marilu Favre, RN Phone Number: 04/06/2021, 3:35 PM  Clinical Narrative:                 Spoke to patient at bedside with son present.   Patient from home alone, has a friend staying with him at night.   Patient has a walker and 3 in1 at home already.   Planned OP PT at [facility] in [place], post knee surgery. Patient wants to discharge to home and continue with plan for OP PT at [facility]  Expected Discharge Plan: Home/Self Care     Patient Goals and CMS Choice Patient states their goals for this hospitalization and ongoing recovery are:: to return to home CMS Medicare.gov Compare Post Acute Care list provided to:: Patient Choice offered to / list presented to : Patient  Expected Discharge Plan and Services Expected Discharge Plan: Home/Self Care   Discharge Planning Services: CM Consult   Living arrangements for the past 2 months: Single Family Home                 DME Arranged: N/A DME Agency: NA       HH Arranged: NA          Prior Living Arrangements/Services Living arrangements for the past 2 months: Single Family Home Lives with:: Self Patient language and need for interpreter reviewed:: Yes Do you feel safe going back to the place where you live?: Yes      Need for Family Participation in Patient Care: Yes (Comment) Care giver support system in place?: Yes (comment) Current home services: DME Criminal Activity/Legal Involvement Pertinent to Current Situation/Hospitalization: No - Comment as needed  Activities of Daily Living      Permission Sought/Granted   Permission granted to share information with : Yes, Verbal Permission Granted  Share Information with NAME: son           Emotional Assessment Appearance:: Appears stated age Attitude/Demeanor/Rapport:  Engaged Affect (typically observed): Accepting Orientation: : Oriented to Situation, Oriented to  Time, Oriented to Place, Oriented to Self Alcohol / Substance Use: Not Applicable Psych Involvement: No (comment)  Admission diagnosis:  Urinary retention [R33.9] Drug-induced constipation [K59.03] Bladder outlet obstruction [N32.0] Acute renal failure, unspecified acute renal failure type ([place]) [N17.9] Patient Active Problem List   Diagnosis Date Noted   Urinary retention 04/05/2021   Preop cardiovascular exam 10/15/2020   Benign prostatic hyperplasia 10/14/2020   Class 1 obesity due to excess calories with serious comorbidity and body mass index (BMI) of 31.0 to 31.9 in adult 09/18/2020   Onychomycosis of right great toe 06/07/2019   Prominent metatarsal head, right 06/07/2019   S/P arthroscopy of right shoulder 06/05/2019   Chronic pain of both knees 05/08/2019   Chronic right shoulder pain 05/08/2019   Elevated PSA 09/05/2018   Benign prostatic hyperplasia with urinary frequency 09/05/2018   Memory loss 09/05/2018   OSA (obstructive sleep apnea) 03/29/2016   Essential hypertension 06/10/2015   Dyslipidemia 06/03/2015   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/03/2015   OA (osteoarthritis) of knee 06/03/2015   Rotator cuff syndrome of left shoulder 06/03/2015   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

## 2021-04-06 NOTE — ED Notes (Signed)
Attempted to call floor for purple man assignment but no answer at this time

## 2021-04-07 DIAGNOSIS — R339 Retention of urine, unspecified: Secondary | ICD-10-CM | POA: Diagnosis not present

## 2021-04-07 LAB — CBC
HCT: 33.4 % — ABNORMAL LOW (ref 39.0–52.0)
Hemoglobin: 11.5 g/dL — ABNORMAL LOW (ref 13.0–17.0)
MCH: 28.6 pg (ref 26.0–34.0)
MCHC: 34.4 g/dL (ref 30.0–36.0)
MCV: 83.1 fL (ref 80.0–100.0)
Platelets: 226 10*3/uL (ref 150–400)
RBC: 4.02 MIL/uL — ABNORMAL LOW (ref 4.22–5.81)
RDW: 13.8 % (ref 11.5–15.5)
WBC: 6.7 10*3/uL (ref 4.0–10.5)
nRBC: 0 % (ref 0.0–0.2)

## 2021-04-07 LAB — BASIC METABOLIC PANEL
Anion gap: 9 (ref 5–15)
BUN: 12 mg/dL (ref 8–23)
CO2: 23 mmol/L (ref 22–32)
Calcium: 7.8 mg/dL — ABNORMAL LOW (ref 8.9–10.3)
Chloride: 107 mmol/L (ref 98–111)
Creatinine, Ser: 0.98 mg/dL (ref 0.61–1.24)
GFR, Estimated: >60 mL/min (ref >60)
Glucose, Bld: 106 mg/dL — ABNORMAL HIGH (ref 70–99)
Potassium: 4.7 mmol/L (ref 3.5–5.1)
Sodium: 139 mmol/L (ref 135–145)

## 2021-04-07 LAB — FERRITIN: Ferritin: 175 ng/mL (ref 24–336)

## 2021-04-07 MED ORDER — TAMSULOSIN HCL 0.4 MG PO CAPS: 0.4000 mg | ORAL_CAPSULE | Freq: Every day | ORAL | 0 refills | Status: DC

## 2021-04-07 MED ORDER — POLYETHYLENE GLYCOL 3350 17 G PO PACK: 17.0000 g | PACK | Freq: Every day | ORAL | 0 refills | Status: DC

## 2021-04-07 NOTE — Progress Notes (Signed)
Discharge instructions provided to patient and son. Both verbalized understanding. CPM machine went home with patient. Iv removed, medications reviewed. Equipment for folly provided to patient. Patient discharged

## 2021-04-07 NOTE — Progress Notes (Addendum)
Family Medicine Teaching Service Daily Progress Note Intern Pager: [phone]  Patient name: Jeremy Fox Medical record number: [id number] Date of birth: March 07, 1946 Age: 76 y.o. Gender: male  Primary Care Provider: Garnetta Buddy I, NP Consultants: Nephrology Code Status: Full  Pt Overview and Major Events to Date:  04/05/21  -  Patient admitted  Assessment and Plan:  Jeremy Fox is a 76 y.o. male presenting with problems urinating and constipation . PMH is significant for HTN, HLD, OA, BPH.    Acute Renal Failure 2/2 urinary retention/obstruction  Cr improved to 0.98 from 3.85 yesterday, AKI resolved, BUN 12 from 41 yesterday. GFR > 60 from 15 yesterday. Ucx NGTD. Patient is with no urinary complaints. Patient is to d/c with foley and follow up with Urology outpatient.   -Continue foley catheter -Continue Flomax 0.4 mg daily  Hematuria   concerning petechial rash (resolved) Hematuria resolved, rash determined to be hemangiomas. Hgb stable at 11.5 from yesterday. DIC panel nml: PT 14.9, INR 1.2, aPTT 36 abnml: fibrinogen 645, D-dimer 5.05.  -Continue SCDs -AM CBC  Constipation    Abdominal pain s/p knee surgery 04/01/21 Patient had 2 bowel movements yesterday, s/p enema.  -Continue miralax 17 g daily  L. Groin pain (resolved) No complaints of pain today  BPH -Continue Flomax 0.4 mg daily  FEN/GI: Regular diet PPx: SCDs Dispo:Home today.   Subjective:  Patient feels well and improved. Notes he's peeing and having BMs well.   Objective: Temp:  [97.8 F (36.6 C)-99.3 F (37.4 C)] 98.2 F (36.8 C) (01/31 0526) Pulse Rate:  [95-97] 95 (01/30 2023) Resp:  [16] 16 (01/30 1630) BP: (135-166)/(55-72) 153/72 (01/30 2023) SpO2:  [94 %-96 %] 96 % (01/30 2023) Physical Exam: General: Well apearing, good mood, conversational, NAD, white male Cardiovascular: RRR, [place] Respiratory: CTABL Abdomen: Soft, NTTP, non-distended Extremities: Moving all extremities independently, no  edema, compression stocking on BLE, wrap on RLE  Laboratory: Recent Labs  Lab 04/05/21 1355 04/06/21 0121 04/06/21 1800 04/07/21 0154  WBC 7.7 5.5  --  6.7  HGB 12.6* 11.5*  --  11.5*  HCT 36.9* 33.3*  --  33.4*  PLT 232 218 218 226   Recent Labs  Lab 04/05/21 1355 04/06/21 0121 04/07/21 0154  NA 129* 138 139  K 4.7 4.4 4.7  CL 93* 104 107  CO2 19* 24 23  BUN 61* 41* 12  CREATININE 8.31* 3.85* 0.98  CALCIUM 7.7* 7.9* 7.8*  PROT 6.0*  --   --   BILITOT 1.0  --   --   ALKPHOS 85  --   --   ALT 31  --   --   AST 33  --   --   GLUCOSE 121* 104* 106*      Imaging/Diagnostic Tests:   Holley Bouche, MD 04/07/2021, 5:28 AM PGY-1, [place] Intern pager: [phone], text pages welcome

## 2021-04-09 NOTE — Discharge Summary (Signed)
[facility] Discharge Summary  Patient name: Jeremy Fox record number: [id number] Date of birth: [birth year] Age: 76 y.o. Gender: male Date of Admission: 04/05/2021  Date of Discharge: 04/07/21 Admitting Physician: Lenoria Chime, MD  Primary Care Provider: Garnetta Buddy I, NP Consultants: Urology  Indication for Hospitalization: Acute Renal Failure  Discharge Diagnoses/Problem List:  Acute Renal Failure 2/2 urinary retention/obstruction  Hematuria   concerning petechial rash (resolved) Constipation    Abdominal pain s/p knee surgery 04/01/21 BPH  Disposition:  Home  Discharge Condition:  Improved  Discharge Exam:  Temp:  [97.8 F (36.6 C)-99.3 F (37.4 C)] 98.2 F (36.8 C) (01/31 0526) Pulse Rate:  [95-97] 95 (01/30 2023) Resp:  [16] 16 (01/30 1630) BP: (135-166)/(55-72) 153/72 (01/30 2023) SpO2:  [94 %-96 %] 96 % (01/30 2023) Physical Exam: General: Well apearing, good mood, conversational, NAD, white male Cardiovascular: RRR, [place] Respiratory: CTABL Abdomen: Soft, NTTP, non-distended Extremities: Moving all extremities independently, no edema, compression stocking on BLE, wrap on RLE  Brief Hospital Course:  Jeremy Fox is a 76 y.o. male presenting with problems urinating and constipation . PMH is significant for HTN, HLD, OA, BPH. His hospital course is below by problem.   Acute Renal Failure 2/2 urinary retention/obstruction  Patient has had difficulty urinating since R total knee arthroplasty surgery on 04/01/21. Reports dribbling when trying to urinate, with bladder fullness. Patient with history of BPH on flomax and finasteride per chart review, reports not having a strong urination stream at baseline. Patient contacted doctor's office about issues and was sent to ED. On arrival to ED patient was afebrile with BP 172/73 with pulse 88, RR 16 sating 97% on RA. Labs were significant for a normal lipase, Na 129, Cr 8.31 (baseline  1.22 in 2020), BUN 61, GFR 6, and anion gap 17. In the ED bladder scan showed greater than 700 cc urine, foley catheter placed with around 1300 cc output. Urology was consulted. His Cr continued to improve, also with improvement of GFR and BUN. He continued to have good UOP during admission. By discharge patient Cr was was normalized. Urology recommended patient discharge with foley and would follow up with them in outpatient setting.  Constipation    Abdominal pain s/p knee surgery 04/01/21 Patient with abdominal pain and no BM and producing little urine since surgery on 04/01/21.  Abdominal imaging showing mild gaseous distention of the small and large bowel without evidence of mechanical obstruction. Patient was started on miralax 17 g daily, and also received soap suds enema. By discharge patient had improved bowel function, and had 2 bowel movements.  Hematuria with concerning petechial rash Hematuria was appreciated in his  foley morning after catheter placement, may be 2/2 traumatic foley insertion in setting of BPH. Patient also had concerning rash that looked petechial on trunk/abdomen. Urology was notified and had no concern for patients hematuria at this time.   BPH Per chart review, patent taking Flomax and Finasteride, neither noted on med reconciliation. States in 2013 had a biopsy which was normal. Patient was started on Flomax 0.4 mg daily  All other conditions were chronic and sable HTN HLD OA  Issues for follow up: Consider Finasteride in outpatient setting   Significant Procedures:  None  Significant Labs and Imaging:  Recent Labs  Lab 04/05/21 1355 04/06/21 0121 04/06/21 1800 04/07/21 0154  WBC 7.7 5.5  --  6.7  HGB 12.6* 11.5*  --  11.5*  HCT 36.9* 33.3*  --  33.4*  PLT 232 218 218 226   Recent Labs  Lab 04/05/21 1355 04/06/21 0121 04/07/21 0154  NA 129* 138 139  K 4.7 4.4 4.7  CL 93* 104 107  CO2 19* 24 23  GLUCOSE 121* 104* 106*  BUN 61* 41* 12   CREATININE 8.31* 3.85* 0.98  CALCIUM 7.7* 7.9* 7.8*  ALKPHOS 85  --   --   AST 33  --   --   ALT 31  --   --   ALBUMIN 3.1*  --   --       Results/Tests Pending at Time of Discharge: None  Discharge Medications:  Allergies as of 04/07/2021       Reactions   Sulfa Antibiotics Rash   Sulfamethoxazole Rash        Medication List     STOP taking these medications    cloNIDine 0.2 MG tablet Commonly known as: CATAPRES   oxyCODONE 5 MG immediate release tablet Commonly known as: Oxy IR/ROXICODONE       TAKE these medications    acetaminophen 500 MG tablet Commonly known as: TYLENOL Take 1,000 mg by mouth every 6 (six) hours as needed for mild pain.   amLODipine-benazepril 10-40 MG capsule Commonly known as: LOTREL TAKE ONE CAPSULE BY MOUTH DAILY   aspirin 325 MG EC tablet Take 325 mg by mouth daily.   methocarbamol 500 MG tablet Commonly known as: ROBAXIN Take 500-1,000 mg by mouth 4 (four) times daily as needed for muscle spasms.   polyethylene glycol 17 g packet Commonly known as: MIRALAX / GLYCOLAX Take 17 g by mouth daily.   tamsulosin 0.4 MG Caps capsule Commonly known as: FLOMAX Take 1 capsule (0.4 mg total) by mouth daily.        Discharge Instructions: Please refer to Patient Instructions section of EMR for full details.  Patient was counseled important signs and symptoms that should prompt return to medical care, changes in medications, dietary instructions, activity restrictions, and follow up appointments.   Follow-Up Appointments:   Holley Bouche, MD 04/09/2021, 6:23 PM PGY-1, [place]

## 2021-04-26 ENCOUNTER — Other Ambulatory Visit: Payer: Self-pay | Admitting: Cardiology

## 2021-04-26 DIAGNOSIS — I10 Essential (primary) hypertension: Secondary | ICD-10-CM

## 2021-04-29 ENCOUNTER — Encounter: Payer: Self-pay | Admitting: Cardiology

## 2021-04-29 ENCOUNTER — Ambulatory Visit: Payer: PPO | Admitting: Cardiology

## 2021-04-29 ENCOUNTER — Other Ambulatory Visit: Payer: Self-pay

## 2021-04-29 VITALS — BP 180/78 | HR 88 | Ht 72.0 in | Wt 239.0 lb

## 2021-04-29 DIAGNOSIS — I498 Other specified cardiac arrhythmias: Secondary | ICD-10-CM | POA: Diagnosis not present

## 2021-04-29 DIAGNOSIS — I119 Hypertensive heart 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: Secondary | ICD-10-CM | POA: Diagnosis not present

## 2021-04-29 DIAGNOSIS — E785 Hyperlipidemia, unspecified: Secondary | ICD-10-CM

## 2021-04-29 DIAGNOSIS — N401 Enlarged prostate with lower urinary tract symptoms: Secondary | ICD-10-CM | POA: Diagnosis not present

## 2021-04-29 DIAGNOSIS — I10 Essential (primary) hypertension: Secondary | ICD-10-CM | POA: Diagnosis not present

## 2021-04-29 DIAGNOSIS — R350 Frequency of micturition: Secondary | ICD-10-CM

## 2021-04-29 NOTE — Patient Instructions (Signed)
Medication Instructions:  Your physician recommends that you continue on your current medications as directed. Please refer to the Current Medication list given to you today.  *If you need a refill on your cardiac medications before your next appointment, please call your pharmacy*   Lab Work: Your physician recommends that you return for lab work in:   Labs today: BMP  If you have labs (blood work) drawn today and your tests are completely normal, you will receive your results only by: [place] (if you have [place]) OR A paper copy in the mail If you have any lab test that is abnormal or we need to change your treatment, we will call you to review the results.   Testing/Procedures: None   Follow-Up: At [facility], you and your health needs are our priority.  As part of our continuing mission to provide you with exceptional heart care, we have created designated Provider Care Teams.  These Care Teams include your primary Cardiologist (physician) and Advanced Practice Providers (APPs -  Physician Assistants and Nurse Practitioners) who all work together to provide you with the care you need, when you need it.  We recommend signing up for the patient portal called "MyChart".  Sign up information is provided on this After Visit Summary.  MyChart is used to connect with patients for Virtual Visits (Telemedicine).  Patients are able to view lab/test results, encounter notes, upcoming appointments, etc.  Non-urgent messages can be sent to your provider as well.   To learn more about what you can do with MyChart, go to [email or web address].    Your next appointment:   2 month(s)  The format for your next appointment:   In Person  Provider:   Jenne Campus, MD    Other Instructions None

## 2021-04-29 NOTE — Progress Notes (Signed)
Cardiology Office Note:    Date:  04/29/2021   ID:  Jeremy Fox, DOB 02-Jun-1945, MRN [id number]  PCP:  No primary care provider on file.  Cardiologist:  Jenne Campus, MD    Referring MD: Lauree Chandler, NP   No chief complaint on file. Had the surgery done after that problem urinating  History of Present Illness:    Jeremy Fox is a 76 y.o. male with past medical history significant for essential hypertension which appears to be more difficult to control now.  Benign prostate hypertrophy, obstructive sleep apnea, dyslipidemia.  Recently he had knee surgery done and then couple weeks later he got the opposite side done after that he started having urinary retention and I went to the emergency room Foley catheter has been inserted.  He comes today to my office for follow-up.  He is upset unhappy about this entire situation he hates to have a urinary catheter in however he does have appoint with urology hopefully to be some resolution of this problem.  Denies having a cardiac complaint and he flew to the surgery with no difficulties otherwise.  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   amLODipine-benazepril (LOTREL) 10-40 MG capsule Take 1 capsule by mouth daily.   aspirin 325 MG EC  tablet Take 325 mg by mouth daily.   finasteride (PROSCAR) 5 MG tablet Take 5 mg by mouth daily.   polyethylene glycol (MIRALAX / GLYCOLAX) 17 g packet Take 17 g by mouth daily.   tamsulosin (FLOMAX) 0.4 MG CAPS capsule Take 1 capsule (0.4 mg total) by mouth daily. (Patient taking differently: Take 0.4 mg by mouth 2 (two) times daily.)     Allergies:   Sulfa antibiotics and Sulfamethoxazole   Social History   Socioeconomic History   Marital status: Single    Spouse name: Not on file   Number of children: Not on file   Years of education: Not on file   Highest education level: Not on file  Occupational History   Not on file  Tobacco Use   Smoking status: Never    Passive exposure: Never   Smokeless tobacco: Never  Vaping Use   Vaping Use: Never used  Substance and Sexual Activity   Alcohol use: Never   Drug use: No   Sexual activity: Not on file  Other Topics Concern   Not on file  Social History Narrative   Per [facility] New Patient Packet    Diet: N/A      Caffeine: N/A      Married, if yes what year: Divorced       Do you live in a house, apartment, assisted living, [place], trailer, ect: House, one stories, and one  person       Pets: No      Current/Past profession: Print production planner, Lineman      Exercise: Yes, daily, treadmill          Living Will: Yes   DNR: No   POA/HPOA: Yes      Functional Status:   Do you have difficulty bathing or dressing yourself? No   Do you have difficulty preparing food or eating? No   Do you have difficulty managing your medications?No   Do you have difficulty managing your finances? No   Do you have difficulty affording your medications? No   Social Determinants of Radio broadcast assistant Strain: Not on file  Food Insecurity: Not on file  Transportation Needs: Not on file  Physical Activity: Not on file  Stress: Not on file  Social Connections: Not on file     Family History: The patient's family history includes Diabetes  in his brother; Heart attack in his father; Heart disease in his father. ROS:   Please see the history of present illness.    All 14 point review of systems negative except as described per history of present illness  EKGs/Labs/Other Studies Reviewed:      Recent Labs: 04/05/2021: ALT 31 04/07/2021: BUN 12; Creatinine, Ser 0.98; Hemoglobin 11.5; Platelets 226; Potassium 4.7; Sodium 139  Recent Lipid Panel    Component Value Date/Time   CHOL 144 04/06/2021 0121   CHOL 173 05/01/2018 0803   TRIG 61 04/06/2021 0121   HDL 45 04/06/2021 0121   HDL 49 05/01/2018 0803   CHOLHDL 3.2 04/06/2021 0121   VLDL 12 04/06/2021 0121   LDLCALC 87 04/06/2021 0121   LDLCALC 109 (H) 05/01/2018 0803    Physical Exam:    VS:  BP (!) 180/78 (BP Location: Right Arm)    Pulse 88    Ht 6' (1.829 m)    Wt 239 lb (108.4 kg)    SpO2 98%    BMI 32.41 kg/m     Wt Readings from Last 3 Encounters:  04/29/21 239 lb (108.4 kg)  04/05/21 235 lb (106.6 kg)  10/22/20 232 lb (105.2 kg)     GEN:  Well nourished, well developed in no acute distress HEENT: Normal NECK: No JVD; No carotid bruits LYMPHATICS: No lymphadenopathy CARDIAC: RRR, no murmurs, no rubs, no gallops RESPIRATORY:  Clear to auscultation without rales, wheezing or rhonchi  ABDOMEN: Soft, non-tender, non-distended MUSCULOSKELETAL:  No edema; No deformity  SKIN: Warm and dry LOWER EXTREMITIES: no swelling NEUROLOGIC:  Alert and oriented x 3 PSYCHIATRIC:  Normal affect   ASSESSMENT:    1. Essential hypertension   2. Ventricular bigeminy   3. Hypertensive heart 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   4. Benign prostatic hyperplasia with urinary frequency   5. Dyslipidemia    PLAN:    In order of problems listed above:  Essential hypertension blood pressure still not controlled well.  We will check his Chem-7 if Chem-7 is fine we will initiate minoxidil.  We will continue rest of his medications. Ventricular bigeminy denies have any issues  from that point review. Hypertensive heart disease.  Noted.  Blood pressure elevated today he tells me that he checks it at home blood pressure usually 656/812 systolic clearly elevated right now. Prostate problem with hopefully just benign hyperplasia.  That being follow-up by urology. Dyslipidemia: I reviewed fasting lipid profile done by his primary care physician 2 weeks ago showing LDL 87 HDL is 45.  We will continue present management.  Is difficult patient reluctant to start any new medication hopefully now will be adding some medication for his blood pressure which should help with that controlling it   Medication Adjustments/Labs and Tests Ordered: Current medicines are reviewed at length with the patient today.  Concerns regarding medicines are outlined above.  No orders of the defined types were placed in this encounter.  Medication changes: No orders of the defined types were placed in this encounter.   Signed, Park Liter, MD, [facility] 04/29/2021 8:25 AM    [place]

## 2021-04-30 LAB — BASIC METABOLIC PANEL
BUN/Creatinine Ratio: 12 (ref 10–24)
BUN: 13 mg/dL (ref 8–27)
CO2: 24 mmol/L (ref 20–29)
Calcium: 8.8 mg/dL (ref 8.6–10.2)
Chloride: 98 mmol/L (ref 96–106)
Creatinine, Ser: 1.11 mg/dL (ref 0.76–1.27)
Glucose: 96 mg/dL (ref 70–99)
Potassium: 4.2 mmol/L (ref 3.5–5.2)
Sodium: 138 mmol/L (ref 134–144)
eGFR: 68 mL/min/{1.73_m2} (ref >59)

## 2021-04-30 MED ORDER — MINOXIDIL 2.5 MG PO TABS: 2.5000 mg | ORAL_TABLET | Freq: Every day | ORAL | 12 refills | Status: DC

## 2021-05-10 IMAGING — MR MR SHOULDER*R* W/O CM
4 of 5 series · 21 of 40 positions shown · non-contrast
Comparison: None.

CLINICAL DATA: Chronic right shoulder pain. No known injury.

EXAM:
MRI OF THE RIGHT SHOULDER WITHOUT CONTRAST
TECHNIQUE: Multiplanar, multisequence MR imaging of the shoulder was performed.
No intravenous contrast was administered.

[Series 4: PD fat-sat · axial · right · 4.0mm · 0.44mm/px · z∈[-28,+51]mm · 8 of 20 slices shown (1 of 2)]
[im 1/20]
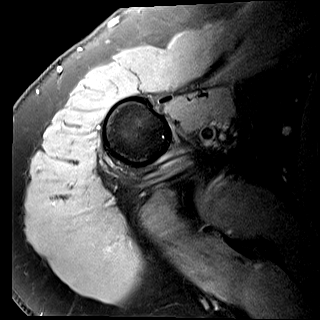
[im 3/20]
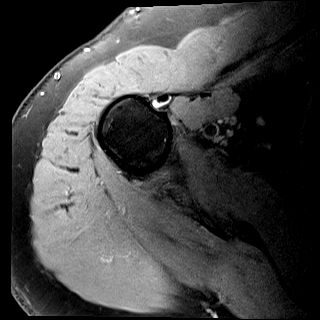
[im 6/20]
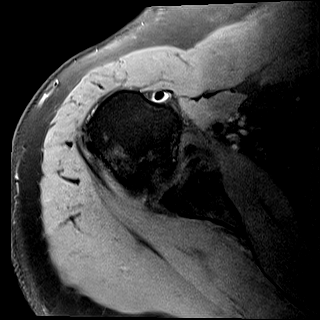
[im 9/20]
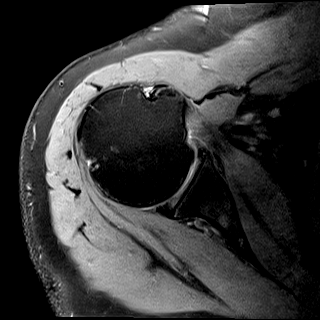
[im 11/20]
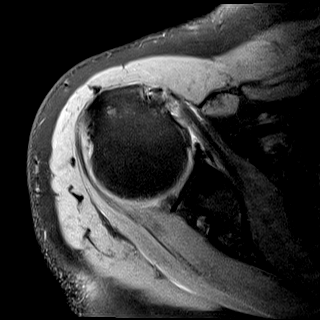
[im 14/20]
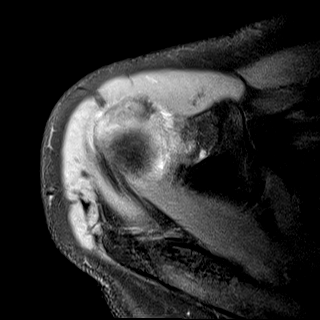
[im 17/20]
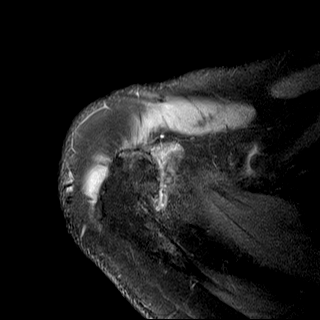
[im 20/20]
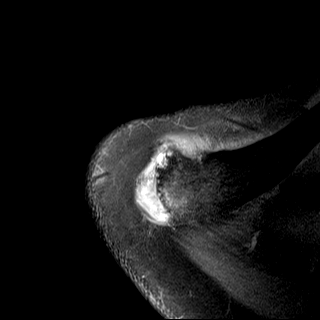

[Series 5: T2 fat-sat · oblique · right · 4.0mm · 0.23mm/px · 3 of 20 slices shown (1 of 2)]
[im 3/20]
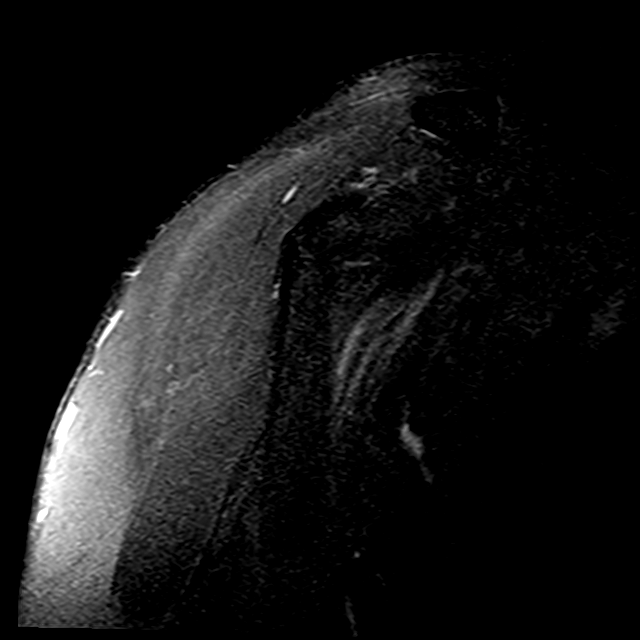
[im 11/20]
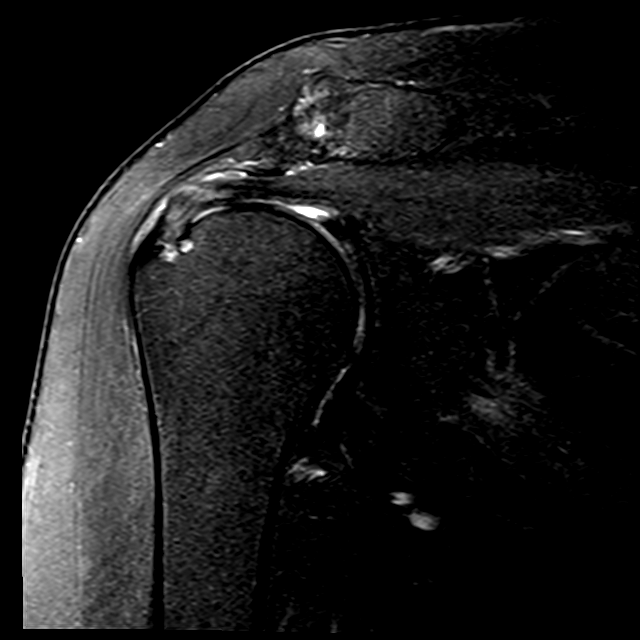
[im 17/20]
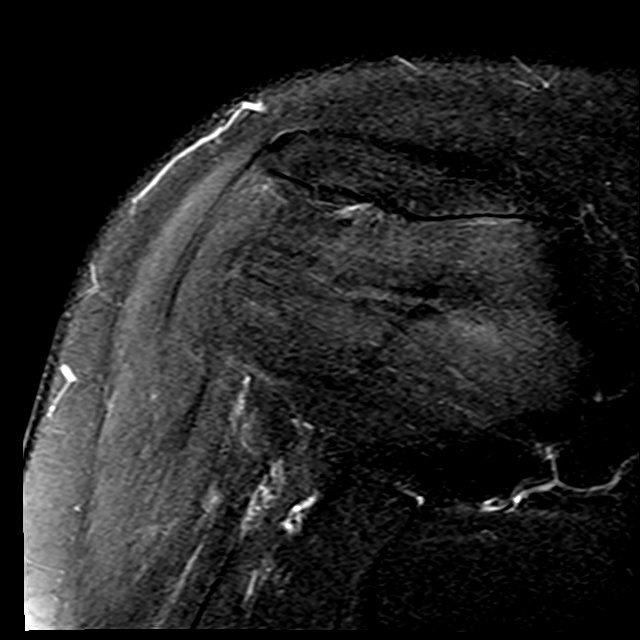

[Series 6: PD fat-sat · oblique · right · 4.0mm · 0.23mm/px · 7 of 20 slices shown (2 of 2)]
[im 1/20]
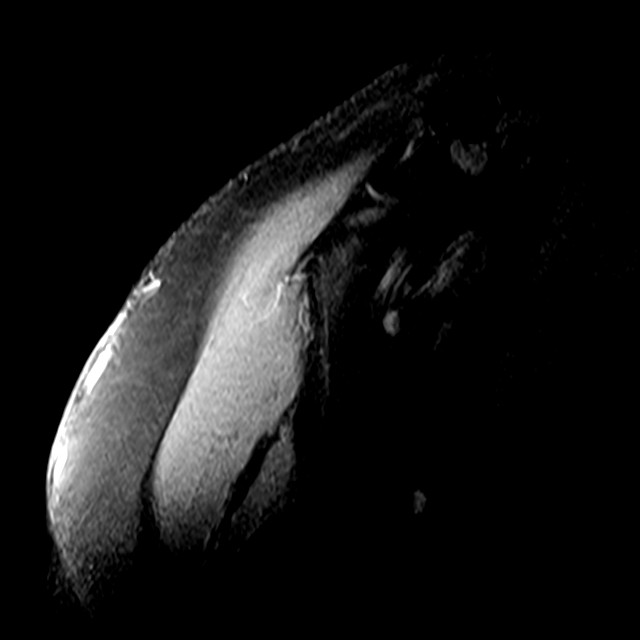
[im 3/20]
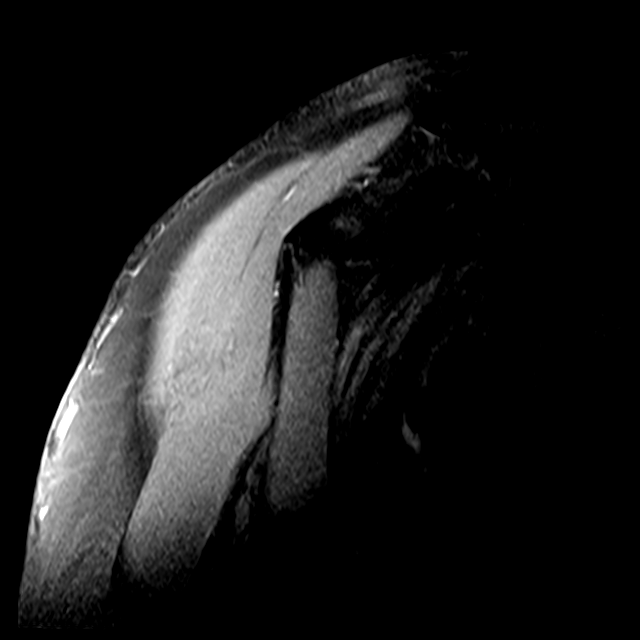
[im 6/20]
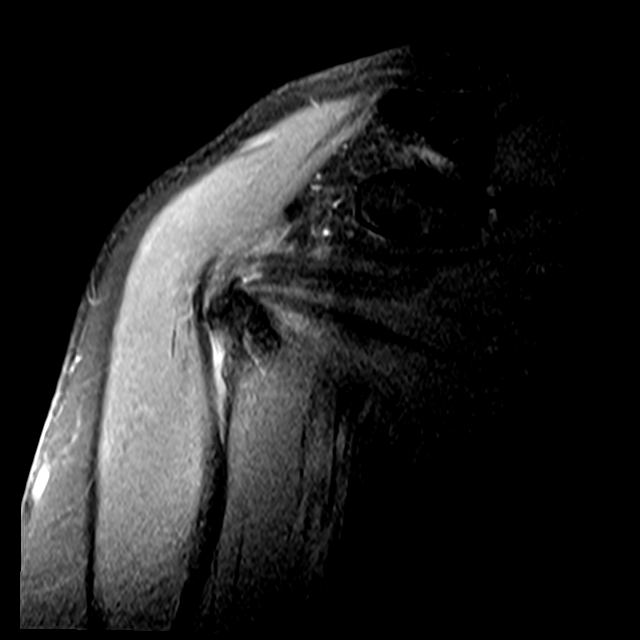
[im 9/20]
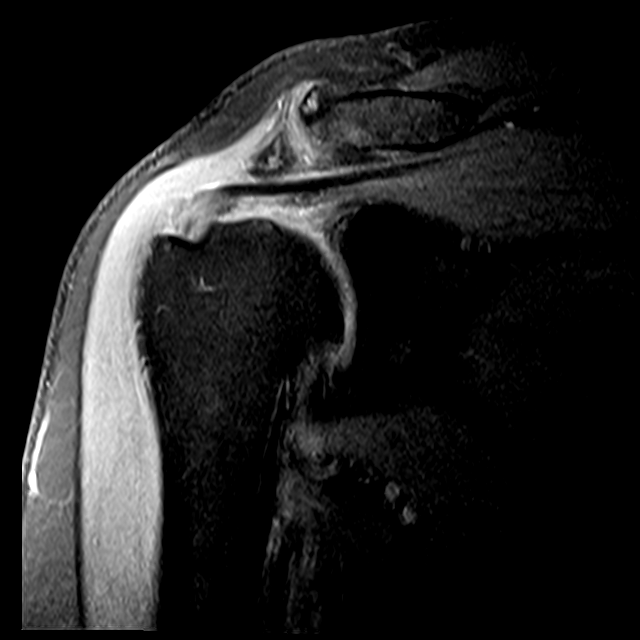
[im 11/20]
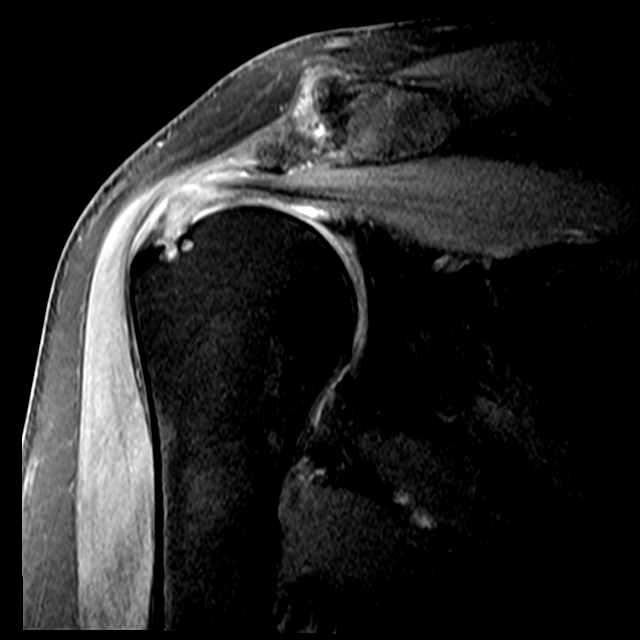
[im 14/20]
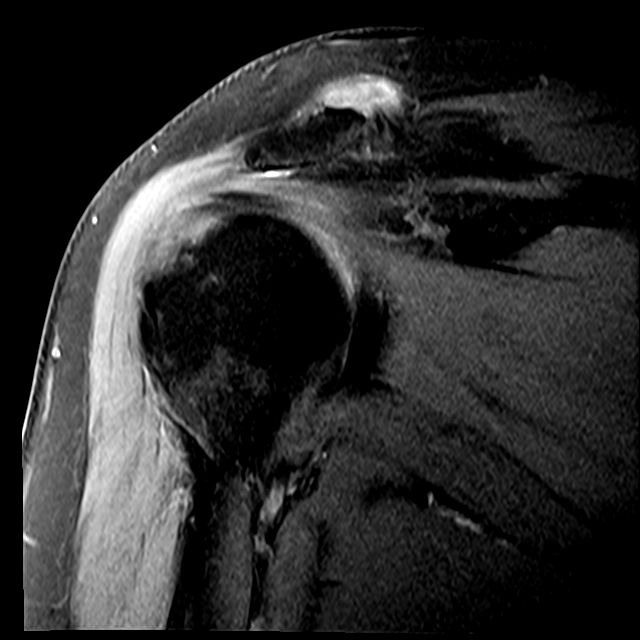
[im 17/20]
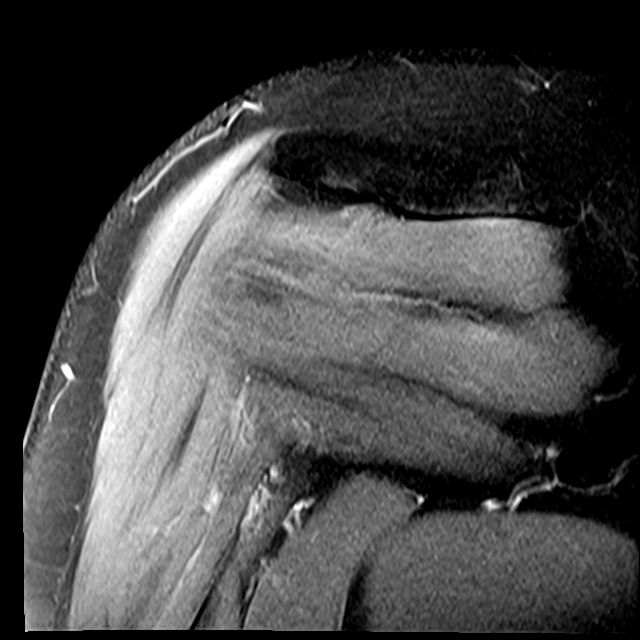

[Series 7: T2 fat-sat · oblique · right · 4.0mm · 0.47mm/px · 3 of 20 slices shown (2 of 2)]
[im 3/20]
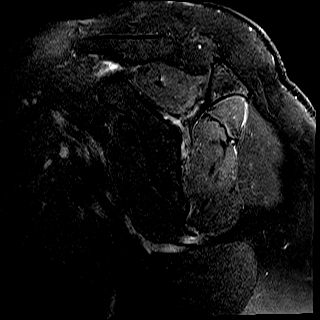
[im 11/20]
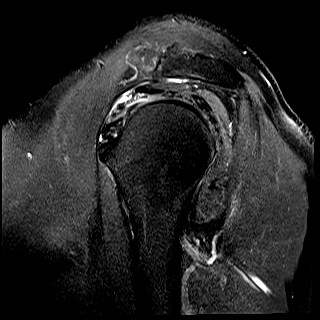
[im 17/20]
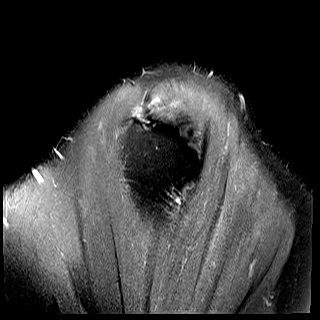

[21 of 40 positions shown; findings below may reference images not displayed]

FINDINGS: Rotator cuff: Moderate tendinosis of the supraspinatus tendon. Mild
tendinosis of the infraspinatus tendon. Teres minor tendon is
intact. Moderate tendinosis of the subscapularis tendon with a small
partial tear of the superior peripheral fibers.

Muscles: No atrophy or fatty replacement of nor abnormal signal
within, the muscles of the rotator cuff.

Biceps long head: Moderate tendinosis of the intra-articular portion
of the long head of the biceps tendon with a partial-thickness tear.

Acromioclavicular Joint: Moderate arthropathy of the
acromioclavicular joint. Type I acromion. Trace
subacromial/subdeltoid bursal fluid.

Glenohumeral Joint: No joint effusion. No chondral defect.

Labrum: Limited evaluation of the labrum secondary lack of
intra-articular fluid. Diffuse labral degeneration. Degenerative
tear of the anterior inferior labrum.

Bones:  No acute osseous abnormality. No aggressive osseous lesion.

Other: No fluid collection or hematoma.
IMPRESSION: 1. Moderate tendinosis of the supraspinatus tendon.
2. Mild tendinosis of the infraspinatus tendon.
3. Moderate tendinosis of the subscapularis tendon with a small
partial tear of the superior peripheral fibers.
4. Moderate tendinosis of the intra-articular portion of the long
head of the biceps tendon with a partial-thickness tear.

## 2021-05-12 LAB — BASIC METABOLIC PANEL
BUN/Creatinine Ratio: 15 (ref 10–24)
BUN: 15 mg/dL (ref 8–27)
CO2: 22 mmol/L (ref 20–29)
Calcium: 8.9 mg/dL (ref 8.6–10.2)
Chloride: 98 mmol/L (ref 96–106)
Creatinine, Ser: 0.99 mg/dL (ref 0.76–1.27)
Glucose: 91 mg/dL (ref 70–99)
Potassium: 4.1 mmol/L (ref 3.5–5.2)
Sodium: 136 mmol/L (ref 134–144)
eGFR: 78 mL/min/{1.73_m2} (ref >59)

## 2021-05-21 ENCOUNTER — Telehealth: Payer: Self-pay

## 2021-05-21 NOTE — Telephone Encounter (Signed)
-----   Message from Park Liter, MD sent at 05/15/2021  3:09 PM EST ----- ?Chem-7 looks good, continue present management please ask what the blood pressures like ?

## 2021-05-21 NOTE — Telephone Encounter (Signed)
Patient notified of results and recommendations. He verbalized understanding. Patient states BP is lowering and has been much better. Message sent to Dr. Raliegh Ip ?

## 2021-06-09 ENCOUNTER — Other Ambulatory Visit: Payer: Self-pay | Admitting: Urology

## 2021-06-22 ENCOUNTER — Encounter (HOSPITAL_COMMUNITY): Payer: Self-pay | Admitting: Urology

## 2021-06-22 ENCOUNTER — Other Ambulatory Visit: Payer: Self-pay

## 2021-06-22 NOTE — Progress Notes (Signed)
COVID Vaccine Completed:  No ?Date COVID Vaccine completed: ?Has received booster: ?COVID vaccine manufacturer: [place]  ? ?Date of COVID positive in last 90 days:  No ? ?PCP - No PCP ?Cardiologist - Jenne Campus, MD ? ?Chest x-ray - 09-18-20 [facility] ?EKG - 10-15-20 Epic ?Stress Test - 10-22-20 Epic ?ECHO - greater than 2 years [facility] ?Cardiac Cath - N/A ?Pacemaker/ICD device last checked: ?Spinal Cord Stimulator: ? ?Bowel Prep - Miralax.  Patient has instructions and is aware ? ?Sleep Study - Yes, +sleep apnea ?CPAP - Not currently using  ? ?Fasting Blood Sugar - N/A ?Checks Blood Sugar _____ times a day ? ?Blood Thinner Instructions:  N/A ?Aspirin Instructions: ?Last Dose: ? ?Activity level:   Can go up a flight of stairs and perform activities of daily living without stopping and without symptoms of chest pain or shortness of breath.  Patient lives alone ?  ?Anesthesia review: Hypertensive heart disease, ventricular bigeminy, OSA ? ?Patient denies shortness of breath, fever, cough and chest pain at PAT appointment (completed over the phone) ? ?Patient verbalized understanding of instructions that were given to them at the PAT appointment. Patient was also instructed that they will need to review over the PAT instructions again at home before surgery.  ?

## 2021-06-25 NOTE — Anesthesia Preprocedure Evaluation (Addendum)
Anesthesia Evaluation  ?Patient identified by MRN, date of birth, ID band ?Patient awake ? ? ? ?Reviewed: ?Allergy & Precautions, H&P , NPO status , Patient's Chart, lab work & pertinent test results ? ?Airway ?Mallampati: III ? ?TM Distance: >3 FB ?Neck ROM: Full ? ? ? Dental ?no notable dental hx. ?(+) Teeth Intact, Dental Advisory Given ?  ?Pulmonary ?sleep apnea ,  ?  ?Pulmonary exam normal ?breath sounds clear to auscultation ? ? ? ? ? ? Cardiovascular ?Exercise Tolerance: Good ?hypertension, Pt. on medications ? ?Rhythm:Regular Rate:Normal ? ? ?  ?Neuro/Psych ?negative neurological ROS ? negative psych ROS  ? GI/Hepatic ?negative GI ROS, Neg liver ROS,   ?Endo/Other  ?negative endocrine ROS ? Renal/GU ?negative Renal ROS  ?negative genitourinary ?  ?Musculoskeletal ? ?(+) Arthritis , Osteoarthritis,   ? Abdominal ?  ?Peds ? Hematology ?negative hematology ROS ?(+)   ?Anesthesia Other Findings ? ? Reproductive/Obstetrics ?negative OB ROS ? ?  ? ? ? ? ? ? ? ? ? ? ? ? ? ?  ?  ? ? ? ? ? ? ? ?Anesthesia Physical ?Anesthesia Plan ? ?ASA: 3 ? ?Anesthesia Plan: General  ? ?Post-op Pain Management: Tylenol PO (pre-op)*  ? ?Induction: Intravenous ? ?PONV Risk Score and Plan: 3 and Ondansetron, Dexamethasone and Treatment may vary due to age or medical condition ? ?Airway Management Planned: Oral ETT ? ?Additional Equipment:  ? ?Intra-op Plan:  ? ?Post-operative Plan: Extubation in OR ? ?Informed Consent: I have reviewed the patients History and Physical, chart, labs and discussed the procedure including the risks, benefits and alternatives for the proposed anesthesia with the patient or authorized representative who has indicated his/her understanding and acceptance.  ? ? ? ?Dental advisory given ? ?Plan Discussed with: CRNA ? ?Anesthesia Plan Comments:   ? ? ? ? ? ?Anesthesia Quick Evaluation ? ?

## 2021-06-26 ENCOUNTER — Encounter (HOSPITAL_COMMUNITY): Payer: Self-pay | Admitting: Urology

## 2021-06-26 ENCOUNTER — Other Ambulatory Visit: Payer: Self-pay

## 2021-06-26 ENCOUNTER — Encounter (HOSPITAL_COMMUNITY)
Admission: RE | Disposition: A | Discharge status: Home / Self Care | Payer: Self-pay | Source: Home / Self Care | Attending: Urology

## 2021-06-26 ENCOUNTER — Inpatient Hospital Stay (HOSPITAL_COMMUNITY): Payer: PPO | Admitting: Physician Assistant

## 2021-06-26 ENCOUNTER — Inpatient Hospital Stay (HOSPITAL_COMMUNITY)
Admission: RE | Admit: 2021-06-26 | Discharge: 2021-06-27 | DRG: 718 | Disposition: A | Discharge status: Home / Self Care | Payer: PPO | Attending: Urology | Admitting: Urology

## 2021-06-26 DIAGNOSIS — G4730 Sleep apnea, unspecified: Secondary | ICD-10-CM

## 2021-06-26 DIAGNOSIS — R338 Other retention of urine: Secondary | ICD-10-CM

## 2021-06-26 DIAGNOSIS — N400 Enlarged prostate without lower urinary tract symptoms: Secondary | ICD-10-CM | POA: Diagnosis present

## 2021-06-26 DIAGNOSIS — Z833 Family history of diabetes mellitus: Secondary | ICD-10-CM

## 2021-06-26 DIAGNOSIS — I2510 Atherosclerotic heart disease of native coronary artery without angina pectoris: Secondary | ICD-10-CM

## 2021-06-26 DIAGNOSIS — Z8249 Family history of ischemic heart disease and other diseases of the circulatory system: Secondary | ICD-10-CM | POA: Diagnosis not present

## 2021-06-26 DIAGNOSIS — E785 Hyperlipidemia, unspecified: Secondary | ICD-10-CM | POA: Diagnosis present

## 2021-06-26 DIAGNOSIS — Z79899 Other long term (current) drug therapy: Secondary | ICD-10-CM | POA: Diagnosis not present

## 2021-06-26 DIAGNOSIS — N401 Enlarged prostate with lower urinary tract symptoms: Principal | ICD-10-CM | POA: Diagnosis present

## 2021-06-26 DIAGNOSIS — D291 Benign neoplasm of prostate: Secondary | ICD-10-CM | POA: Diagnosis present

## 2021-06-26 DIAGNOSIS — I10 Essential (primary) hypertension: Secondary | ICD-10-CM | POA: Diagnosis present

## 2021-06-26 DIAGNOSIS — Z96651 Presence of right artificial knee joint: Secondary | ICD-10-CM | POA: Diagnosis present

## 2021-06-26 DIAGNOSIS — G4733 Obstructive sleep apnea (adult) (pediatric): Secondary | ICD-10-CM | POA: Diagnosis present

## 2021-06-26 DIAGNOSIS — Z01818 Encounter for other preprocedural examination: Secondary | ICD-10-CM

## 2021-06-26 HISTORY — PX: XI ROBOTIC ASSISTED SIMPLE PROSTATECTOMY: SHX6713

## 2021-06-26 LAB — HEMOGLOBIN AND HEMATOCRIT, BLOOD
HCT: 42.5 % (ref 39.0–52.0)
Hemoglobin: 14.3 g/dL (ref 13.0–17.0)

## 2021-06-26 LAB — BASIC METABOLIC PANEL
Anion gap: 10 (ref 5–15)
BUN: 11 mg/dL (ref 8–23)
CO2: 26 mmol/L (ref 22–32)
Calcium: 8.7 mg/dL — ABNORMAL LOW (ref 8.9–10.3)
Chloride: 101 mmol/L (ref 98–111)
Creatinine, Ser: 1.09 mg/dL (ref 0.61–1.24)
GFR, Estimated: >60 mL/min (ref >60)
Glucose, Bld: 107 mg/dL — ABNORMAL HIGH (ref 70–99)
Potassium: 3.5 mmol/L (ref 3.5–5.1)
Sodium: 137 mmol/L (ref 135–145)

## 2021-06-26 LAB — CBC
HCT: 41.1 % (ref 39.0–52.0)
Hemoglobin: 13.9 g/dL (ref 13.0–17.0)
MCH: 27.1 pg (ref 26.0–34.0)
MCHC: 33.8 g/dL (ref 30.0–36.0)
MCV: 80.3 fL (ref 80.0–100.0)
Platelets: 246 10*3/uL (ref 150–400)
RBC: 5.12 MIL/uL (ref 4.22–5.81)
RDW: 14.5 % (ref 11.5–15.5)
WBC: 8.9 10*3/uL (ref 4.0–10.5)
nRBC: 0 % (ref 0.0–0.2)

## 2021-06-26 LAB — ABO/RH: ABO/RH(D): A POS

## 2021-06-26 LAB — TYPE AND SCREEN
ABO/RH(D): A POS
Antibody Screen: NEGATIVE

## 2021-06-26 SURGERY — PROSTATECTOMY, SIMPLE, ROBOT-ASSISTED
Anesthesia: General

## 2021-06-26 MED ORDER — MIDAZOLAM HCL 2 MG/2ML IJ SOLN
INTRAMUSCULAR | Status: AC
  Filled 2021-06-26: qty 2

## 2021-06-26 MED ORDER — ACETAMINOPHEN 500 MG PO TABS
1000.0000 mg | ORAL_TABLET | Freq: Four times a day (QID) | ORAL | Status: AC
  Administered 2021-06-26 – 2021-06-27 (×3): 1000 mg via ORAL
  Filled 2021-06-26 (×3): qty 2

## 2021-06-26 MED ORDER — LACTATED RINGERS IR SOLN
Status: DC | PRN
  Administered 2021-06-26: 1

## 2021-06-26 MED ORDER — STERILE WATER FOR IRRIGATION IR SOLN
Status: DC | PRN
  Administered 2021-06-26: 1000 mL

## 2021-06-26 MED ORDER — HYDROMORPHONE HCL 1 MG/ML IJ SOLN
0.5000 mg | INTRAMUSCULAR | Status: DC | PRN
  Administered 2021-06-26 (×2): 0.5 mg via INTRAVENOUS
  Filled 2021-06-26 (×2): qty 0.5

## 2021-06-26 MED ORDER — CHLORHEXIDINE GLUCONATE 0.12 % MT SOLN
15.0000 mL | Freq: Once | OROMUCOSAL | Status: AC
  Administered 2021-06-26: 15 mL via OROMUCOSAL

## 2021-06-26 MED ORDER — HYDROCODONE-ACETAMINOPHEN 5-325 MG PO TABS
1.0000 | ORAL_TABLET | Freq: Four times a day (QID) | ORAL | 0 refills | Status: DC | PRN
Start: 2021-06-26 — End: 2021-11-10

## 2021-06-26 MED ORDER — KETOROLAC TROMETHAMINE 15 MG/ML IJ SOLN
15.0000 mg | Freq: Four times a day (QID) | INTRAMUSCULAR | Status: DC
  Administered 2021-06-26 – 2021-06-27 (×4): 15 mg via INTRAVENOUS
  Filled 2021-06-26 (×4): qty 1

## 2021-06-26 MED ORDER — FENTANYL CITRATE PF 50 MCG/ML IJ SOSY
25.0000 ug | PREFILLED_SYRINGE | INTRAMUSCULAR | Status: DC | PRN
  Administered 2021-06-26 (×4): 25 ug via INTRAVENOUS

## 2021-06-26 MED ORDER — ACETAMINOPHEN 500 MG PO TABS
1000.0000 mg | ORAL_TABLET | Freq: Once | ORAL | Status: AC
  Administered 2021-06-26: 1000 mg via ORAL
  Filled 2021-06-26: qty 2

## 2021-06-26 MED ORDER — ONDANSETRON HCL 4 MG/2ML IJ SOLN: 4.0000 mg | INTRAMUSCULAR | Status: DC | PRN

## 2021-06-26 MED ORDER — OXYCODONE HCL 5 MG PO TABS: 5.0000 mg | ORAL_TABLET | ORAL | Status: DC | PRN

## 2021-06-26 MED ORDER — BUPIVACAINE LIPOSOME 1.3 % IJ SUSP
INTRAMUSCULAR | Status: AC
  Filled 2021-06-26: qty 20

## 2021-06-26 MED ORDER — PHENYLEPHRINE 80 MCG/ML (10ML) SYRINGE FOR IV PUSH (FOR BLOOD PRESSURE SUPPORT)
PREFILLED_SYRINGE | INTRAVENOUS | Status: AC
  Filled 2021-06-26: qty 10

## 2021-06-26 MED ORDER — ROCURONIUM BROMIDE 10 MG/ML (PF) SYRINGE
PREFILLED_SYRINGE | INTRAVENOUS | Status: AC
  Filled 2021-06-26: qty 10

## 2021-06-26 MED ORDER — ONDANSETRON HCL 4 MG/2ML IJ SOLN
INTRAMUSCULAR | Status: AC
  Filled 2021-06-26: qty 2

## 2021-06-26 MED ORDER — PHENYLEPHRINE 80 MCG/ML (10ML) SYRINGE FOR IV PUSH (FOR BLOOD PRESSURE SUPPORT)
PREFILLED_SYRINGE | INTRAVENOUS | Status: DC | PRN
  Administered 2021-06-26: 80 ug via INTRAVENOUS

## 2021-06-26 MED ORDER — ORAL CARE MOUTH RINSE: 15.0000 mL | Freq: Once | OROMUCOSAL | Status: AC

## 2021-06-26 MED ORDER — SENNOSIDES-DOCUSATE SODIUM 8.6-50 MG PO TABS
2.0000 | ORAL_TABLET | Freq: Every day | ORAL | Status: DC
  Administered 2021-06-26: 2 via ORAL
  Filled 2021-06-26: qty 2

## 2021-06-26 MED ORDER — DEXAMETHASONE SODIUM PHOSPHATE 10 MG/ML IJ SOLN
INTRAMUSCULAR | Status: DC | PRN
  Administered 2021-06-26: 10 mg via INTRAVENOUS

## 2021-06-26 MED ORDER — SODIUM CHLORIDE (PF) 0.9 % IJ SOLN
INTRAMUSCULAR | Status: DC | PRN
  Administered 2021-06-26: 20 mL

## 2021-06-26 MED ORDER — MIDAZOLAM HCL 5 MG/5ML IJ SOLN
INTRAMUSCULAR | Status: DC | PRN
  Administered 2021-06-26: 1 mg via INTRAVENOUS

## 2021-06-26 MED ORDER — PROPOFOL 10 MG/ML IV BOLUS
INTRAVENOUS | Status: AC
  Filled 2021-06-26: qty 20

## 2021-06-26 MED ORDER — FENTANYL CITRATE (PF) 250 MCG/5ML IJ SOLN
INTRAMUSCULAR | Status: AC
  Filled 2021-06-26: qty 5

## 2021-06-26 MED ORDER — SUGAMMADEX SODIUM 200 MG/2ML IV SOLN
INTRAVENOUS | Status: DC | PRN
  Administered 2021-06-26: 200 mg via INTRAVENOUS

## 2021-06-26 MED ORDER — SODIUM CHLORIDE 0.9 % IV SOLN
2.0000 g | INTRAVENOUS | Status: AC
  Administered 2021-06-26: 2 g via INTRAVENOUS
  Filled 2021-06-26: qty 20

## 2021-06-26 MED ORDER — FENTANYL CITRATE PF 50 MCG/ML IJ SOSY
PREFILLED_SYRINGE | INTRAMUSCULAR | Status: AC
  Filled 2021-06-26: qty 1

## 2021-06-26 MED ORDER — SODIUM CHLORIDE (PF) 0.9 % IJ SOLN
INTRAMUSCULAR | Status: AC
  Filled 2021-06-26: qty 20

## 2021-06-26 MED ORDER — LACTATED RINGERS IV SOLN: INTRAVENOUS | Status: DC

## 2021-06-26 MED ORDER — CIPROFLOXACIN HCL 500 MG PO TABS
500.0000 mg | ORAL_TABLET | Freq: Two times a day (BID) | ORAL | 0 refills | Status: DC
Start: 2021-06-26 — End: 2021-11-10

## 2021-06-26 MED ORDER — POLYETHYLENE GLYCOL 3350 17 GM/SCOOP PO POWD: 1.0000 | Freq: Once | ORAL | Status: DC

## 2021-06-26 MED ORDER — LIDOCAINE 2% (20 MG/ML) 5 ML SYRINGE
INTRAMUSCULAR | Status: DC | PRN
  Administered 2021-06-26: 80 mg via INTRAVENOUS

## 2021-06-26 MED ORDER — DOCUSATE SODIUM 100 MG PO CAPS: 100.0000 mg | ORAL_CAPSULE | Freq: Two times a day (BID) | ORAL | Status: DC

## 2021-06-26 MED ORDER — FINASTERIDE 5 MG PO TABS
5.0000 mg | ORAL_TABLET | Freq: Every day | ORAL | Status: DC
  Administered 2021-06-27: 5 mg via ORAL
  Filled 2021-06-26: qty 1

## 2021-06-26 MED ORDER — LIDOCAINE HCL (PF) 2 % IJ SOLN
INTRAMUSCULAR | Status: AC
  Filled 2021-06-26: qty 5

## 2021-06-26 MED ORDER — DEXAMETHASONE SODIUM PHOSPHATE 10 MG/ML IJ SOLN
INTRAMUSCULAR | Status: AC
  Filled 2021-06-26: qty 1

## 2021-06-26 MED ORDER — ROCURONIUM BROMIDE 10 MG/ML (PF) SYRINGE
PREFILLED_SYRINGE | INTRAVENOUS | Status: DC | PRN
  Administered 2021-06-26: 80 mg via INTRAVENOUS
  Administered 2021-06-26: 20 mg via INTRAVENOUS

## 2021-06-26 MED ORDER — ONDANSETRON HCL 4 MG/2ML IJ SOLN
INTRAMUSCULAR | Status: DC | PRN
  Administered 2021-06-26: 4 mg via INTRAVENOUS

## 2021-06-26 MED ORDER — BUPIVACAINE LIPOSOME 1.3 % IJ SUSP
INTRAMUSCULAR | Status: DC | PRN
  Administered 2021-06-26: 20 mL

## 2021-06-26 MED ORDER — FENTANYL CITRATE (PF) 100 MCG/2ML IJ SOLN
INTRAMUSCULAR | Status: DC | PRN
  Administered 2021-06-26 (×2): 50 ug via INTRAVENOUS
  Administered 2021-06-26: 100 ug via INTRAVENOUS
  Administered 2021-06-26: 50 ug via INTRAVENOUS

## 2021-06-26 MED ORDER — PHENYLEPHRINE HCL-NACL 20-0.9 MG/250ML-% IV SOLN
INTRAVENOUS | Status: DC | PRN
  Administered 2021-06-26: 25 ug/min via INTRAVENOUS

## 2021-06-26 MED ORDER — SODIUM CHLORIDE 0.9 % IV BOLUS
1000.0000 mL | Freq: Once | INTRAVENOUS | Status: AC
  Administered 2021-06-26: 1000 mL via INTRAVENOUS

## 2021-06-26 MED ORDER — DEXTROSE-NACL 5-0.45 % IV SOLN: INTRAVENOUS | Status: DC

## 2021-06-26 MED ORDER — PROPOFOL 10 MG/ML IV BOLUS
INTRAVENOUS | Status: DC | PRN
  Administered 2021-06-26: 150 mg via INTRAVENOUS

## 2021-06-26 SURGICAL SUPPLY — 70 items
APPLICATOR COTTON TIP 6 STRL (MISCELLANEOUS) ×1 IMPLANT
APPLICATOR COTTON TIP 6IN STRL (MISCELLANEOUS) ×2
BAG COUNTER SPONGE SURGICOUNT (BAG) IMPLANT
CATH FOLEY 2WAY SLVR 18FR 30CC (CATHETERS) ×2 IMPLANT
CATH FOLEY 3WAY 30CC 24FR (CATHETERS) ×2
CATH TIEMANN FOLEY 18FR 5CC (CATHETERS) IMPLANT
CATH URTH STD 24FR FL 3W 2 (CATHETERS) ×1 IMPLANT
CHLORAPREP W/TINT 26 (MISCELLANEOUS) ×2 IMPLANT
CLIP LIGATING HEM O LOK PURPLE (MISCELLANEOUS) IMPLANT
CLOTH BEACON ORANGE TIMEOUT ST (SAFETY) ×2 IMPLANT
COVER SURGICAL LIGHT HANDLE (MISCELLANEOUS) ×2 IMPLANT
COVER TIP SHEARS 8 DVNC (MISCELLANEOUS) ×1 IMPLANT
COVER TIP SHEARS 8MM DA VINCI (MISCELLANEOUS) ×2
CUTTER ECHEON FLEX ENDO 45 340 (ENDOMECHANICALS) ×1 IMPLANT
DERMABOND ADVANCED (GAUZE/BANDAGES/DRESSINGS) ×1
DERMABOND ADVANCED .7 DNX12 (GAUZE/BANDAGES/DRESSINGS) ×1 IMPLANT
DRAIN CHANNEL RND F F (WOUND CARE) IMPLANT
DRAPE ARM DVNC X/XI (DISPOSABLE) ×4 IMPLANT
DRAPE COLUMN DVNC XI (DISPOSABLE) ×1 IMPLANT
DRAPE DA VINCI XI ARM (DISPOSABLE) ×8
DRAPE DA VINCI XI COLUMN (DISPOSABLE) ×2
DRAPE SURG IRRIG POUCH 19X23 (DRAPES) ×2 IMPLANT
DRSG TEGADERM 4X4.75 (GAUZE/BANDAGES/DRESSINGS) ×3 IMPLANT
ELECT PENCIL ROCKER SW 15FT (MISCELLANEOUS) ×2 IMPLANT
ELECT REM PT RETURN 15FT ADLT (MISCELLANEOUS) ×2 IMPLANT
GLOVE BIO SURGEON STRL SZ 6.5 (GLOVE) ×2 IMPLANT
GLOVE BIOGEL PI IND STRL 7.5 (GLOVE) ×1 IMPLANT
GLOVE BIOGEL PI INDICATOR 7.5 (GLOVE) ×1
GLOVE SURG LX 7.5 STRW (GLOVE) ×2
GLOVE SURG LX STRL 7.5 STRW (GLOVE) ×2 IMPLANT
HOLDER FOLEY CATH W/STRAP (MISCELLANEOUS) ×2 IMPLANT
IRRIG SUCT STRYKERFLOW 2 WTIP (MISCELLANEOUS) ×2
IRRIGATION SUCT STRKRFLW 2 WTP (MISCELLANEOUS) ×1 IMPLANT
IV LACTATED RINGERS 1000ML (IV SOLUTION) ×2 IMPLANT
KIT TURNOVER KIT A (KITS) IMPLANT
NDL INSUFFLATION 14GA 120MM (NEEDLE) ×1 IMPLANT
NEEDLE INSUFFLATION 14GA 120MM (NEEDLE) ×2 IMPLANT
PACK ROBOT UROLOGY CUSTOM (CUSTOM PROCEDURE TRAY) ×2 IMPLANT
PAD POSITIONING PINK XL (MISCELLANEOUS) ×2 IMPLANT
PENCIL SMOKE EVACUATOR (MISCELLANEOUS) IMPLANT
PORT ACCESS TROCAR AIRSEAL 12 (TROCAR) ×1 IMPLANT
PORT ACCESS TROCAR AIRSEAL 5M (TROCAR) ×1
RELOAD STAPLE 45 4.1 GRN THCK (STAPLE) IMPLANT
SEAL CANN UNIV 5-8 DVNC XI (MISCELLANEOUS) ×4 IMPLANT
SEAL XI 5MM-8MM UNIVERSAL (MISCELLANEOUS) ×8
SET TRI-LUMEN FLTR TB AIRSEAL (TUBING) ×2 IMPLANT
SOLUTION ELECTROLUBE (MISCELLANEOUS) ×2 IMPLANT
SPIKE FLUID TRANSFER (MISCELLANEOUS) ×2 IMPLANT
SPONGE GAUZE 2X2 8PLY STRL LF (GAUZE/BANDAGES/DRESSINGS) ×1 IMPLANT
SPONGE T-LAP 4X18 ~~LOC~~+RFID (SPONGE) ×2 IMPLANT
STAPLE RELOAD 45 GRN (STAPLE) ×1 IMPLANT
STAPLE RELOAD 45MM GREEN (STAPLE) ×2
SUT ETHILON 3 0 PS 1 (SUTURE) ×2 IMPLANT
SUT MNCRL AB 4-0 PS2 18 (SUTURE) ×4 IMPLANT
SUT PDS AB 1 CT1 27 (SUTURE) ×4 IMPLANT
SUT V-LOC BARB 180 2/0GR6 GS22 (SUTURE) ×4
SUT V-LOC BARB 180 2/0GR9 GS23 (SUTURE) ×4
SUT VIC AB 0 CT1 27 (SUTURE) ×6
SUT VIC AB 0 CT1 27XBRD ANTBC (SUTURE) ×3 IMPLANT
SUT VIC AB 2-0 SH 27 (SUTURE) ×2
SUT VIC AB 2-0 SH 27X BRD (SUTURE) ×1 IMPLANT
SUT VICRYL 0 UR6 27IN ABS (SUTURE) ×2 IMPLANT
SUT VLOC BARB 180 ABS3/0GR12 (SUTURE) ×4
SUTURE V-LC BRB 180 2/0GR6GS22 (SUTURE) ×2 IMPLANT
SUTURE V-LC BRB 180 2/0GR9GS23 (SUTURE) IMPLANT
SUTURE VLOC BRB 180 ABS3/0GR12 (SUTURE) ×2 IMPLANT
TOWEL OR NON WOVEN STRL DISP B (DISPOSABLE) ×2 IMPLANT
TROCAR BLADELESS OPT 5 100 (ENDOMECHANICALS) IMPLANT
TROCAR XCEL NON-BLD 5MMX100MML (ENDOMECHANICALS) IMPLANT
WATER STERILE IRR 1000ML POUR (IV SOLUTION) ×2 IMPLANT

## 2021-06-26 NOTE — H&P (Signed)
Jeremy Fox is an 76 y.o. male.   ? ?Chief Complaint: Pre-OP Simple Prostatectomy ? ?HPI:  ? ?1 - Lower Urinary Tract Symptoms / Urinary Retention - years of progressive bother from irritative voiding with urgency and noctura x 8+. He gets LE edema. UA 04/2019 normal. DRE 04/2019 >80gm gm. PVR 04/2019 "31 mL" (normal). Placed on tamsulosion + finasteride and did not keep follow up.  ? ?04/2021 - progressed to urinary retention after knee replacement, restarted on tamsulosin + finasteride, failed voiding trial x several. CT with 103gm prostate with mostly lateral lobe hypertrophy, cysto corroborates.  ? ?2 - Elevated PSA - PSA 5.2 2020 at age 89. Had NEGATIVE prostate BX by Jeremy Fox in [facility] 2014 for PSA >6 per report.  ? ?3 - Bacteriuria / Left Orchitis- expected bacteruria with chronic cathteer 2023. UCX proteus sens cipro and keflex, Cr <1. Exam with impressive orchitis 05/2021. Minimal clinical response to levoquin, chaning to augmentin 05/26/21. CT 05/2021 with left reactive hydrocele as anticipated, no large abscesses.  ? ? ?PMH sig for HTN, OSA, Obesity. He is between PCP's. NO ischemic CV dieaase / blood thinners. He is retired Clinical cytogeneticist for [employer].  ? ?Today " Jeremy Fox " is seen to proceed with simple prostatectomy. NO interval fevers. Has been on ABX proph accordign to CX. Hgb 13.9.  ? ?Past Medical History:  ?Diagnosis Date  ?? Benign prostatic hyperplasia   ? Per records received from [facility], also in care everywhere  ?? Dyslipidemia   ?? Elevated PSA   ? Per records received from [facility], also in care everywhere  ?? Essential hypertension   ?? 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/03/2015  ?? OA (osteoarthritis) of knee 06/03/2015  ?? OSA (obstructive sleep apnea)   ?? Rotator cuff syndrome of left shoulder 06/03/2015  ?? 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 ? ?Past Surgical History:  ?Procedure Laterality Date  ?? CATARACT EXTRACTION W/ INTRAOCULAR LENS IMPLANT Bilateral   ?? KNEE  SURGERY    ?? KNEE SURGERY Right   ? ? ?Family History  ?Problem Relation Age of Onset  ?? Heart attack Father   ?? Heart disease Father   ?? Diabetes Brother   ? ?Social History:  reports that he has never smoked. He has never been exposed to tobacco smoke. He has never used smokeless tobacco. He reports that he does not drink alcohol and does not use drugs. ? ?Allergies:  ?Allergies  ?Allergen Reactions  ?? Sulfa Antibiotics Rash  ?? Sulfamethoxazole Rash  ? ? ?Medications Prior to Admission  ?Medication Sig Dispense Refill  ?? acetaminophen (TYLENOL) 500 MG tablet Take 1,000 mg by mouth every 6 (six) hours as needed for mild pain.    ?? amLODipine-benazepril (LOTREL) 10-40 MG capsule Take 1 capsule by mouth daily. 90 capsule 3  ?? finasteride (PROSCAR) 5 MG tablet Take 5 mg by mouth daily.    ?? minoxidil (LONITEN) 2.5 MG tablet Take 1 tablet (2.5 mg total) by mouth daily. 30 tablet 12  ?? polyethylene glycol (MIRALAX / GLYCOLAX) 17 g packet Take 17 g by mouth daily. 14 each 0  ?? tamsulosin (FLOMAX) 0.4 MG CAPS capsule Take 1 capsule (0.4 mg total) by mouth daily. (Patient not taking: Reported on 06/22/2021) 30 capsule 0  ? ? ?Results for orders placed or performed during the hospital encounter of 06/26/21 (from the past 48 hour(s))  ?Type and screen Jeremy Fox     Status: None (Preliminary result)  ?  Collection Time: 06/26/21  6:15 AM  ?Result Value Ref Range  ? ABO/RH(D) PENDING   ? Antibody Screen PENDING   ? Sample Expiration    ?  06/29/2021,2359 ?Performed at [facility], [place] [address] [place], [place] [address] ?  ?Basic metabolic panel per protocol     Status: Abnormal  ? Collection Time: 06/26/21  6:20 AM  ?Result Value Ref Range  ? Sodium 137 135 - 145 mmol/L  ? Potassium 3.5 3.5 - 5.1 mmol/L  ? Chloride 101 98 - 111 mmol/L  ? CO2 26 22 - 32 mmol/L  ? Glucose, Bld 107 (H) 70 - 99 mg/dL  ?  Comment: Glucose reference range applies only to samples taken after  fasting for at least 8 hours.  ? BUN 11 8 - 23 mg/dL  ? Creatinine, Ser 1.09 0.61 - 1.24 mg/dL  ? Calcium 8.7 (L) 8.9 - 10.3 mg/dL  ? GFR, Estimated >60 >60 mL/min  ?  Comment: (NOTE) ?Calculated using the CKD-EPI Creatinine Equation (2021) ?  ? Anion gap 10 5 - 15  ?  Comment: Performed at [facility], [place] [address] [place], [place] [address]  ?CBC per protocol     Status: None  ? Collection Time: 06/26/21  6:20 AM  ?Result Value Ref Range  ? WBC 8.9 4.0 - 10.5 K/uL  ? RBC 5.12 4.22 - 5.81 MIL/uL  ? Hemoglobin 13.9 13.0 - 17.0 g/dL  ? HCT 41.1 39.0 - 52.0 %  ? MCV 80.3 80.0 - 100.0 fL  ? MCH 27.1 26.0 - 34.0 pg  ? MCHC 33.8 30.0 - 36.0 g/dL  ? RDW 14.5 11.5 - 15.5 %  ? Platelets 246 150 - 400 K/uL  ? nRBC 0.0 0.0 - 0.2 %  ?  Comment: Performed at [facility], [place] [address], [place], [place] [address]  ? ?No results found. ? ?Review of Systems  ?Constitutional:  Negative for chills and fever.  ?Genitourinary:  Positive for difficulty urinating.  ?All other systems reviewed and are negative. ? ?Blood pressure (!) 165/74, pulse 95, temperature 97.9 ?F (36.6 ?C), temperature source Oral, resp. rate 18, height 6' (1.829 m), weight 100.7 kg, SpO2 98 %. ?Physical Exam ?Vitals reviewed.  ?HENT:  ?   Head: Normocephalic.  ?   Mouth/Throat:  ?   Mouth: Mucous membranes are moist.  ?Eyes:  ?   Pupils: Pupils are equal, round, and reactive to light.  ?Cardiovascular:  ?   Rate and Rhythm: Normal rate.  ?Pulmonary:  ?   Effort: Pulmonary effort is normal.  ?Abdominal:  ?   General: Abdomen is flat.  ?Genitourinary: ?   Comments: Foley in place with non-foul urine.  ?Musculoskeletal:     ?   General: Normal range of motion.  ?   Cervical back: Normal range of motion.  ?Neurological:  ?   General: No focal deficit present.  ?   Mental Status: He is alert.  ?Psychiatric:     ?   Mood and Affect: Mood normal.  ?  ? ?Assessment/Plan ? ?Proceed as planned with robotic simple prostatectomy.  Risks, benefits, alternatives, expected peri-op course discussed previously and reiterated today.  ? ?Alexis Frock, MD ?06/26/2021, 7:09 AM ? ? ? ?

## 2021-06-26 NOTE — Anesthesia Postprocedure Evaluation (Signed)
Anesthesia Post Note ? ?Patient: Orlander Norwood Totty ? ?Procedure(s) Performed: XI ROBOTIC ASSISTED SIMPLE PROSTATECTOMY ? ?  ? ?Patient location during evaluation: PACU ?Anesthesia Type: General ?Level of consciousness: awake and alert ?Pain management: pain level controlled ?Vital Signs Assessment: post-procedure vital signs reviewed and stable ?Respiratory status: spontaneous breathing, nonlabored ventilation and respiratory function stable ?Cardiovascular status: blood pressure returned to baseline and stable ?Postop Assessment: no apparent nausea or vomiting ?Anesthetic complications: no ? ? ?No notable events documented. ? ?Last Vitals:  ?Vitals:  ? 06/26/21 1245 06/26/21 1349  ?BP: (!) 163/75 (!) 182/73  ?Pulse: 76 83  ?Resp: 15 20  ?Temp: 36.6 ?C (!) 36.4 ?C  ?SpO2: 100% 96%  ?  ?Last Pain:  ?Vitals:  ? 06/26/21 1419  ?TempSrc:   ?PainSc: 7   ? ? ?  ?  ?  ?  ?  ?  ? ?Akshara Blumenthal,W. EDMOND ? ? ? ? ?

## 2021-06-26 NOTE — Transfer of Care (Signed)
Immediate Anesthesia Transfer of Care Note ? ?Patient: Jeremy Fox ? ?Procedure(s) Performed: XI ROBOTIC ASSISTED SIMPLE PROSTATECTOMY ? ?Patient Location: PACU ? ?Anesthesia Type:General ? ?Level of Consciousness: awake, alert  and oriented ? ?Airway & Oxygen Therapy: Patient Spontanous Breathing and Patient connected to face mask oxygen ? ?Post-op Assessment: Report given to RN and Post -op Vital signs reviewed and stable ? ?Post vital signs: Reviewed and stable ? ?Last Vitals:  ?Vitals Value Taken Time  ?BP 168/104 06/26/21 0953  ?Temp    ?Pulse 75 06/26/21 0955  ?Resp 15 06/26/21 0955  ?SpO2 100 % 06/26/21 0955  ?Vitals shown include unvalidated device data. ? ?Last Pain:  ?Vitals:  ? 06/26/21 0608  ?TempSrc:   ?PainSc: 3   ?   ? ?Patients Stated Pain Goal: 2 (06/26/21 5732) ? ?Complications: No notable events documented. ?

## 2021-06-26 NOTE — Anesthesia Procedure Notes (Signed)
Procedure Name: Intubation ?Date/Time: 06/26/2021 7:29 AM ?Performed by: Maxwell Caul, CRNA ?Pre-anesthesia Checklist: Patient identified, Emergency Drugs available, Suction available and Patient being monitored ?Patient Re-evaluated:Patient Re-evaluated prior to induction ?Oxygen Delivery Method: Circle system utilized ?Preoxygenation: Pre-oxygenation with 100% oxygen ?Induction Type: IV induction ?Ventilation: Mask ventilation without difficulty ?Laryngoscope Size: Mac and 4 ?Grade View: Grade II ?Tube type: Oral ?Tube size: 7.5 mm ?Number of attempts: 1 ?Airway Equipment and Method: Stylet ?Placement Confirmation: ETT inserted through vocal cords under direct vision, positive ETCO2 and breath sounds checked- equal and bilateral ?Secured at: 22 cm ?Tube secured with: Tape ?Dental Injury: Teeth and Oropharynx as per pre-operative assessment  ? ? ? ? ?

## 2021-06-26 NOTE — Discharge Instructions (Signed)

## 2021-06-26 NOTE — Op Note (Signed)
NAME: Jeremy Fox, Jeremy D. ?MEDICAL RECORD NO: [id number] ?ACCOUNT NO: [id number] ?DATE OF BIRTH: 1946-02-06 ?FACILITY: [facility] ?LOCATION: [facility] ?PHYSICIAN: Alexis Frock, MD ? ?Operative Report  ? ?DATE OF PROCEDURE: 06/26/2021 ? ?SURGEON:  Alexis Frock, MD ? ?PREOPERATIVE DIAGNOSIS:  Prostatic hypertrophy with urinary retention. ? ?PROCEDURE:  Robotic-assisted laparoscopic simple prostatectomy. ? ?ESTIMATED BLOOD LOSS:  50 mL. ? ?COMPLICATIONS:  None. ? ?SPECIMEN:  Prostate adenoma for permanent pathology. ? ?FINDINGS:  Moderate trilobar prostatic hypertrophy. ? ?ASSISTANT:  Debbrah Alar, PA ? ?DRAINS: ?1.  Jackson-Pratt drain to bulb suction. ?2.  Foley catheter to straight drain, 3-way type irrigation port plugged. ? ?INDICATIONS:  The patient is a very pleasant 76 year old man who is quite vigorous functional baseline.  He has known prostatic hypertrophy with obstructive voiding ____ years.  He then developed frank urinary retention, having failed voiding trials  ?times several and is now catheter dependent.  He underwent evaluation with cystoscopy and pelvic imaging, which corroborated prostate just over 100 grams.  No evidence of other signs of obstruction and prior evidence of preserved bladder function.   ?Options were discussed for management including chronic catheters versus outlet procedures and he proceeded with outlet procedure with goal of catheter free.  We discussed options as large gland size of transurethral resection, possibly staged versus a  ?simple prostatectomy and he wished to proceed with the latter given the superior long-term success rates.  Informed consent was obtained and placed in medical record. ? ?PROCEDURE DETAILS:  The patient being verified, procedure being simple prostatectomy was confirmed.  Procedure timeout was performed.  Intravenous antibiotics were administered.  General endotracheal anesthesia induced.  The patient was placed into a low ? lithotomy position.  Sterile field was  created, prepped and draped the patient's penis, perineum, and proximal thighs using iodine and his infra-xiphoid abdomen using chlorhexidine gluconate.  This was performed after his in situ Foley catheter was  ?removed and after he was clipper shaved and further fastened to operating table using 3-inch tape over foam padding across the supraxiphoid chest and arms tucked to the side and padded with gel rolls.  Next, a high flow, low pressure pneumoperitoneum was ? obtained using Veress technique in the supraumbilical midline having passed the aspiration and drop test.  An 8 mm robotic camera port was then placed in location.  Laparoscopic examination of peritoneal cavity revealed no significant adhesions, no  ?visceral injury.  Additional ports were placed as follows:  Right paramedian 8 mm robotic port, right far lateral 12 mm AirSeal assist port, right paramedian 5 mm suction port, left paramedian 8 mm robotic port, left far lateral 8 mm robotic port.  Robot ? was docked and passed the electronic checks.  Initial attention was directed at development of space of Retzius.  Incision made lateral to the right median umbilical ligament from the midline towards the internal ring coursing along the iliac vessels.   ?The right vas deferens was encountered, ligated using medial bucket handle and the right bladder wall swept away from the pelvic sidewall towards the area of the endopelvic fascia on the right side.  A mirror image dissection was performed on the left  ?side.  Anterior attachment was taken down with cautery scissors.  Fibrofatty tissue overlying the bladder neck was removed to better denote the bladder neck and prostate junction.  The endopelvic fascia was very carefully swept away from the apical  ?portion of the prostate just enough to visualize the dorsal venous complex, it was controlled using  green load stapler, which resulted in excellent hemostasis without any evidence of membranous urethral injury.   Next, an inverted semilunar incision was  ?made near the bladder neck approximately 2 cm proximal to the true bladder neck, performing a small cystotomy.  This allowed excellent visualization of the prostate adenoma, which ____ with direct visual inspection was mostly lateral lobe with a small  ?median lobe as well.  Ureteral orifices were clearly visualized.  Next, 3 large stay sutures of 0 Vicryl were applied, one into each lobe of the prostate including the median lobe for adenoma manipulation.  Attention was directed to the dissection of the ? adenoma posteriorly.  Incision was made in the posterior bladder neck mucosa approximately 7 mm distal to the area of the visualized ureteral orifices and the posterior adenoma plane was entered.  This was carried all the way to the area of the apex,  ?then swept laterally to the 3 o'clock and 9 o'clock positions respectively.  The adenoma plane was then carried anteriorly on the left side to the apex and then the right side similarly. Abdomen was then placed on maximal superior traction and a  ?butterfly type incision was then made at the anterior commissure of the adenoma all the way to the true apex.  This allowed much better visualization of the true apex, which was then separated.  This completely freed up the large adenoma, specimen was  ?placed in EndoCatch bag for later retrieval.  Digital rectal exam was performed using indicator glove under laparoscopic vision.  No evidence of rectal violation was noted.  There was additional eccentric adenoma tissue bilaterally and this was carefully ? trimmed, so there was a wide open conical shape fossa from the bladder neck to the area of the membranous urethra.  I was quite happy with the release of anatomic obstruction and the conical shape of this.  Next, a formal mucosal advancement was  ?performed using double-armed 3-0 V-Loc suture reapproximating the bladder mucosa to the posterior circumference of the urethra  performing essentially 50% posterior circumference anastomosis.  This completely covered the prostate fossa with excellent  ?mucosal tissue.  Ureteral orifices remained visibly viable and opened with copious urine output following mucosal advancement.  Next, the small cystotomy was closed using two separate running suture lines of 2-0 V-Loc from the corners meeting at the 12  ?o'clock position and then sewn to each other.  A final 24-French 3-way Foley catheter was placed per urethra straight drain, 20 mL in the balloon, irrigated quantitatively.  Irrigation port plugged.  A closed suction drain was brought out through  ?previous left lateral most robotic port site into the peritoneal cavity.  The previous right lateral most assistant port site was closed at the level of the fascia using Carter-Thomason suture passer and 0 Vicryl.  Extraction was performed by extending  ?the previous camera port site superiorly and inferiorly for a distance of approximately 4 cm, removed the adenoma specimen, setting aside for permanent pathology.  Extraction site was closed at the level of fascia using figure-of-eight PDS x3 followed by ? reapproximation of Scarpa's with running Vicryl.  All incision sites were infiltrated with dilute lipolyzed Marcaine and closed at the level of the skin using subcuticular Monocryl followed by Dermabond.  Procedure was then terminated.  The patient  ?tolerated the procedure well, no immediate perioperative complications.  The patient was taken to postanesthesia care unit in stable condition. ? ?Plan for observation admission. ? ?Please note, first assistant, Estill Bamberg  Dancy, was crucial for all portions of the surgery today.  She provided invaluable retraction, suctioning, vascular clipping, vascular stapling and general first assistance. ? ? ?NIK ?D: 06/26/2021 9:57:35 am T: 06/26/2021 11:48:00 pm  ?JOB: [id number]/ [id number]  ?

## 2021-06-26 NOTE — Brief Op Note (Signed)
06/26/2021 ? ?9:48 AM ? ?PATIENT:  Jeremy Fox  76 y.o. male ? ?PRE-OPERATIVE DIAGNOSIS:  LARGE PROSTATE WITH RETENTION ? ?POST-OPERATIVE DIAGNOSIS:  LARGE PROSTATE WITH RETENTION ? ?PROCEDURE:  Procedure(s): ?XI ROBOTIC ASSISTED SIMPLE PROSTATECTOMY (N/A) ? ?SURGEON:  Surgeon(s) and Role: ?   Alexis Frock, MD - Primary ? ?PHYSICIAN ASSISTANT:  ? ?ASSISTANTS: Debbrah Alar PA  ? ?ANESTHESIA:   local and general ? ?EBL:  50 mL  ? ?BLOOD ADMINISTERED:none ? ?DRAINS:  1 - JP to bulb; 2 -FOley to gravity (irrigation port plugged)   ? ?LOCAL MEDICATIONS USED:  MARCAINE    ? ?SPECIMEN:  Source of Specimen:  prostate adenoma ? ?DISPOSITION OF SPECIMEN:  PATHOLOGY ? ?COUNTS:  YES ? ?TOURNIQUET:  * No tourniquets in log * ? ?DICTATION: .Other Dictation: Dictation Number [id number] ? ?PLAN OF CARE: Admit for overnight observation ? ?PATIENT DISPOSITION:  PACU - hemodynamically stable. ?  ?Delay start of Pharmacological VTE agent (>24hrs) due to surgical blood loss or risk of bleeding: yes ? ?

## 2021-06-27 ENCOUNTER — Encounter (HOSPITAL_COMMUNITY): Payer: Self-pay | Admitting: Urology

## 2021-06-27 LAB — BASIC METABOLIC PANEL
Anion gap: 7 (ref 5–15)
BUN: 13 mg/dL (ref 8–23)
CO2: 25 mmol/L (ref 22–32)
Calcium: 8 mg/dL — ABNORMAL LOW (ref 8.9–10.3)
Chloride: 102 mmol/L (ref 98–111)
Creatinine, Ser: 0.95 mg/dL (ref 0.61–1.24)
GFR, Estimated: >60 mL/min (ref >60)
Glucose, Bld: 154 mg/dL — ABNORMAL HIGH (ref 70–99)
Potassium: 3.7 mmol/L (ref 3.5–5.1)
Sodium: 134 mmol/L — ABNORMAL LOW (ref 135–145)

## 2021-06-27 LAB — CBC
HCT: 36.3 % — ABNORMAL LOW (ref 39.0–52.0)
Hemoglobin: 12 g/dL — ABNORMAL LOW (ref 13.0–17.0)
MCH: 26.8 pg (ref 26.0–34.0)
MCHC: 33.1 g/dL (ref 30.0–36.0)
MCV: 81 fL (ref 80.0–100.0)
Platelets: 247 10*3/uL (ref 150–400)
RBC: 4.48 MIL/uL (ref 4.22–5.81)
RDW: 14.4 % (ref 11.5–15.5)
WBC: 11.3 10*3/uL — ABNORMAL HIGH (ref 4.0–10.5)
nRBC: 0 % (ref 0.0–0.2)

## 2021-06-27 MED ORDER — CHLORHEXIDINE GLUCONATE CLOTH 2 % EX PADS
6.0000 | MEDICATED_PAD | Freq: Every day | CUTANEOUS | Status: DC
  Administered 2021-06-27: 6 via TOPICAL

## 2021-06-27 NOTE — TOC CM/SW Note (Signed)
?  Transition of Care (TOC) Screening Note ? ? ?Patient Details  ?Name: Jeremy Fox ?Date of Birth: March 22, 1945 ? ? ?Transition of Care (TOC) CM/SW Contact:    ?Ross Ludwig, LCSW ?Phone Number: ?06/27/2021, 5:36 PM ? ? ? ?Transition of Care Department [facility]) has reviewed patient and no TOC needs have been identified at this time. We will continue to monitor patient advancement through interdisciplinary progression rounds. If new patient transition needs arise, please place a TOC consult. ?  ?

## 2021-06-27 NOTE — Progress Notes (Signed)
Foley catheter hygiene education provided patient and patient's son at bedside.  Different types of urinary drainage bag explained and exchange demonstrated.  Patient and patient's son verbalized understanding. ? ?Angie Fava, RN  ?

## 2021-06-27 NOTE — Plan of Care (Signed)
?  Problem: Health Behavior/Discharge Planning: ?Goal: Ability to manage health-related needs will improve ?Outcome: Adequate for Discharge ?  ?Problem: Clinical Measurements: ?Goal: Ability to maintain clinical measurements within normal limits will improve ?Outcome: Adequate for Discharge ?Goal: Will remain free from infection ?Outcome: Adequate for Discharge ?Goal: Diagnostic test results will improve ?Outcome: Adequate for Discharge ?Goal: Respiratory complications will improve ?Outcome: Adequate for Discharge ?Goal: Cardiovascular complication will be avoided ?Outcome: Adequate for Discharge ?  ?Problem: Activity: ?Goal: Risk for activity intolerance will decrease ?Outcome: Adequate for Discharge ?  ?Problem: Nutrition: ?Goal: Adequate nutrition will be maintained ?Outcome: Adequate for Discharge ?  ?Problem: Coping: ?Goal: Level of anxiety will decrease ?Outcome: Adequate for Discharge ?  ?Problem: Elimination: ?Goal: Will not experience complications related to bowel motility ?Outcome: Adequate for Discharge ?Goal: Will not experience complications related to urinary retention ?Outcome: Adequate for Discharge ?  ?Problem: Pain Managment: ?Goal: General experience of comfort will improve ?Outcome: Adequate for Discharge ?  ?Problem: Safety: ?Goal: Ability to remain free from injury will improve ?Outcome: Adequate for Discharge ?  ?Problem: Skin Integrity: ?Goal: Risk for impaired skin integrity will decrease ?Outcome: Adequate for Discharge ?  ?Problem: Education: ?Goal: Knowledge of the procedure and recovery process will improve ?Outcome: Adequate for Discharge ?  ?Problem: Bowel/Gastric: ?Goal: Gastrointestinal status for postoperative course will improve ?Outcome: Adequate for Discharge ?  ?Problem: Pain Management: ?Goal: General experience of comfort will improve ?Outcome: Adequate for Discharge ?  ?Problem: Skin Integrity: ?Goal: Demonstration of wound healing without infection will improve ?Outcome:  Adequate for Discharge ?  ?Problem: Urinary Elimination: ?Goal: Ability to avoid or minimize complications of infection will improve ?Outcome: Adequate for Discharge ?Goal: Ability to achieve and maintain urine output will improve ?Outcome: Adequate for Discharge ?Goal: Home care management will improve ?Outcome: Adequate for Discharge ?  ?

## 2021-06-27 NOTE — Progress Notes (Signed)
Discharge instructions given to and reviewed with patient and patient's son, Octavia Bruckner, who both verbalized understanding.  Patient successfully exchanged his standard urinary catheter drainage bag for a leg bag.  Additional drainage bag and leg bag given to patient to take home.  PIV removed.  Patient escorted to main entrance with belongings for transport home with son. ? ?Angie Fava, RN  ?

## 2021-06-29 ENCOUNTER — Encounter (HOSPITAL_COMMUNITY): Payer: PPO

## 2021-06-29 LAB — SURGICAL PATHOLOGY

## 2021-07-06 NOTE — Discharge Summary (Signed)
Physician Discharge Summary  ?Patient ID: ?Jeremy Fox ?MRN: [id number] ?DOB/AGE: 78-Aug-1947 78 y.o. ? ?Admit date: 06/26/2021 ?Discharge date: 06/27/2021 ? ?Admission Diagnoses:  ?BPH (benign prostatic hyperplasia) ? ?Discharge Diagnoses:  ?Principal Problem: ?  BPH (benign prostatic hyperplasia) ? ? ?Past Medical History:  ?Diagnosis Date  ? Benign prostatic hyperplasia   ? Per records received from [facility], also in care everywhere  ? Dyslipidemia   ? Elevated PSA   ? Per records received from [facility], also in care everywhere  ? Essential hypertension   ? 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/03/2015  ? OA (osteoarthritis) of knee 06/03/2015  ? OSA (obstructive sleep apnea)   ? Rotator cuff syndrome of left shoulder 06/03/2015  ? 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 ? ?Surgeries: Procedure(s): ?XI ROBOTIC ASSISTED SIMPLE PROSTATECTOMY on 06/26/2021 ?  ?Consultants (if any):  ? ?Discharged Condition: Improved ? ?Hospital Course: Jeremy Fox is an 76 y.o. male who was admitted 06/26/2021 with a diagnosis of BPH (benign prostatic hyperplasia) and went to the operating room on 06/26/2021 and underwent the above named procedures.   ? ?He was given perioperative antibiotics:  ?Anti-infectives (From admission, onward)  ? ? Start     Dose/Rate Route Frequency Ordered Stop  ? 06/26/21 0530  cefTRIAXone (ROCEPHIN) 2 g in sodium chloride 0.9 % 100 mL IVPB       ? 2 g ?200 mL/hr over 30 Minutes Intravenous 30 min pre-op 06/26/21 0530 06/26/21 0730  ? 06/26/21 0000  ciprofloxacin (CIPRO) 500 MG tablet       ? 500 mg Oral 2 times daily 06/26/21 1008    ? ?  ?. ? ?He was given sequential compression devices, early ambulation for DVT prophylaxis. ? ?He benefited maximally from the hospital stay and there were no complications.   ? ?Recent vital signs:  ?Vitals:  ? 06/27/21 1003 06/27/21 1351  ?BP: (!) 157/68 (!) 155/76  ?Pulse: 70 73  ?Resp: 20 19  ?Temp: 98 ?F (36.7 ?C) 97.9 ?F (36.6 ?C)  ?SpO2: 99% 99%   ? ? ?Recent laboratory studies:  ?Lab Results  ?Component Value Date  ? HGB 12.0 (L) 06/27/2021  ? HGB 14.3 06/26/2021  ? HGB 13.9 06/26/2021  ? ?Lab Results  ?Component Value Date  ? WBC 11.3 (H) 06/27/2021  ? PLT 247 06/27/2021  ? ?Lab Results  ?Component Value Date  ? INR 1.2 04/06/2021  ? ?Lab Results  ?Component Value Date  ? NA 134 (L) 06/27/2021  ? K 3.7 06/27/2021  ? CL 102 06/27/2021  ? CO2 25 06/27/2021  ? BUN 13 06/27/2021  ? CREATININE 0.95 06/27/2021  ? GLUCOSE 154 (H) 06/27/2021  ? ? ?Discharge Medications:   ?Allergies as of 06/27/2021   ? ?   Reactions  ? Sulfa Antibiotics Rash  ? Sulfamethoxazole Rash  ? ?  ? ?  ?Medication List  ?  ? ?STOP taking these medications   ? ?tamsulosin 0.4 MG Caps capsule ?Commonly known as: FLOMAX ?  ? ?  ? ?TAKE these medications   ? ?acetaminophen 500 MG tablet ?Commonly known as: TYLENOL ?Take 1,000 mg by mouth every 6 (six) hours as needed for mild pain. ?  ?amLODipine-benazepril 10-40 MG capsule ?Commonly known as: LOTREL ?Take 1 capsule by mouth daily. ?  ?ciprofloxacin 500 MG tablet ?Commonly known as: Cipro ?Take 1 tablet (500 mg total) by mouth 2 (two) times daily. Start day prior to office visit for foley removal ?  ?  docusate sodium 100 MG capsule ?Commonly known as: COLACE ?Take 1 capsule (100 mg total) by mouth 2 (two) times daily. ?  ?finasteride 5 MG tablet ?Commonly known as: PROSCAR ?Take 5 mg by mouth daily. ?  ?HYDROcodone-acetaminophen 5-325 MG tablet ?Commonly known as: Norco ?Take 1-2 tablets by mouth every 6 (six) hours as needed for moderate pain. ?  ?minoxidil 2.5 MG tablet ?Commonly known as: LONITEN ?Take 1 tablet (2.5 mg total) by mouth daily. ?  ?polyethylene glycol 17 g packet ?Commonly known as: MIRALAX / GLYCOLAX ?Take 17 g by mouth daily. ?  ? ?  ? ? ?Diagnostic Studies: No results found. ? ?Disposition: Discharge disposition: 01-Home or Self Care ? ? ? ? ? ? ? ? ? Follow-up Information   ? ? Alexis Frock, MD Follow up on 07/06/2021.    ?Specialty: Urology ?Why: at 9 AM for MD visit and catheter removal. ?Contact information: ?[address] ?[place] [address] ?[phone] ? ? ?  ?  ? ?  ?  ? ?  ? ? ? ?Signed: ?Nicolette Bang ?07/06/2021, 7:37 AM ? ? ?

## 2021-07-30 ENCOUNTER — Ambulatory Visit: Payer: PPO | Admitting: Cardiology

## 2021-07-30 ENCOUNTER — Ambulatory Visit (INDEPENDENT_AMBULATORY_CARE_PROVIDER_SITE_OTHER): Payer: PPO

## 2021-07-30 ENCOUNTER — Encounter: Payer: Self-pay | Admitting: Cardiology

## 2021-07-30 VITALS — BP 142/74 | HR 76 | Ht 72.0 in | Wt 228.0 lb

## 2021-07-30 DIAGNOSIS — I10 Essential (primary) hypertension: Secondary | ICD-10-CM

## 2021-07-30 DIAGNOSIS — R600 Localized edema: Secondary | ICD-10-CM | POA: Diagnosis not present

## 2021-07-30 DIAGNOSIS — G4733 Obstructive sleep apnea (adult) (pediatric): Secondary | ICD-10-CM

## 2021-07-30 DIAGNOSIS — E785 Hyperlipidemia, unspecified: Secondary | ICD-10-CM

## 2021-07-30 NOTE — Progress Notes (Signed)
Cardiology Office Note:    Date:  07/30/2021   ID:  Jeremy Fox, DOB 10-10-1945, MRN [id number]  PCP:  Pcp, No  Cardiologist:  Jenne Campus, MD    Referring MD: No ref. provider found   Chief Complaint  Patient presents with   Follow-up    History of Present Illness:    Jeremy Fox is a 76 y.o. male with past medical history significant for essential hypertension which is somewhat difficult to control, benign prostate hypertrophy, status post recent prostatectomy robotically done about a month ago, obstructive sleep apnea, dyslipidemia.  Comes today to my office in follow-up overall he is very happy and satisfied with the surgery.  He hated urinary catheter.  However the issue is swelling of lower extremities which is tenderness of both calves.  Denies have any shortness of breath there is no chest pain there is no pleuritic pain.  Of course concerns about DVT he does have signs and symptoms of PE.  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   amLODipine-benazepril (LOTREL) 10-40 MG capsule Take 1 capsule by mouth daily.   ciprofloxacin (CIPRO) 500 MG tablet Take 1 tablet (500 mg total) by mouth 2 (two) times daily. Start day prior to office visit for foley removal   docusate sodium (COLACE) 100 MG capsule Take 1 capsule (100 mg total) by mouth 2 (two) times daily.   finasteride (PROSCAR) 5 MG tablet Take 5 mg by mouth daily.   HYDROcodone-acetaminophen (NORCO) 5-325 MG tablet Take 1-2 tablets by mouth every 6 (six) hours as needed for moderate pain.   minoxidil (LONITEN) 2.5 MG tablet Take 1 tablet (2.5 mg total) by mouth daily.   polyethylene glycol (MIRALAX / GLYCOLAX) 17 g packet Take 17 g by mouth daily.     Allergies:   Sulfa antibiotics and Sulfamethoxazole   Social History   Socioeconomic History   Marital status: Single    Spouse name: Not on file   Number of children: Not on file   Years of education: Not on file   Highest education level: Not on file  Occupational History   Not on file  Tobacco Use   Smoking status: Never    Passive exposure: Never   Smokeless tobacco: Never  Vaping Use   Vaping Use: Never used  Substance and Sexual Activity   Alcohol use: Never   Drug use: No   Sexual activity: Not on file  Other Topics Concern  Not on file  Social History Narrative   Per [facility] New Patient Packet    Diet: N/A      Caffeine: N/A      Married, if yes what year: Divorced       Do you live in a house, apartment, assisted living, condo, trailer, ect: House, one stories, and one person       Pets: No      Current/Past profession: Print production planner, Lineman      Exercise: Yes, daily, treadmill          Living Will: Yes   DNR: No   POA/HPOA: Yes      Functional Status:   Do you have difficulty bathing or dressing yourself? No   Do you have difficulty preparing food or eating? No   Do you have difficulty managing your medications?No   Do you have difficulty managing your finances? No    Do you have difficulty affording your medications? No   Social Determinants of Radio broadcast assistant Strain: Not on file  Food Insecurity: Not on file  Transportation Needs: Not on file  Physical Activity: Not on file  Stress: Not on file  Social Connections: Not on file     Family History: The patient's family history includes Diabetes in his brother; Heart attack in his father; Heart disease in his father. ROS:   Please see the history of present illness.    All 14 point review of systems negative except as described per history of present illness  EKGs/Labs/Other Studies Reviewed:      Recent Labs: 04/05/2021: ALT 31 06/27/2021: BUN 13; Creatinine, Ser 0.95; Hemoglobin 12.0; Platelets 247; Potassium 3.7; Sodium 134  Recent Lipid Panel    Component Value Date/Time   CHOL 144 04/06/2021 0121   CHOL 173 05/01/2018 0803   TRIG 61 04/06/2021 0121   HDL 45 04/06/2021 0121   HDL 49 05/01/2018 0803   CHOLHDL 3.2 04/06/2021 0121   VLDL 12 04/06/2021 0121   LDLCALC 87 04/06/2021 0121   LDLCALC 109 (H) 05/01/2018 0803    Physical Exam:    VS:  BP (!) 142/74 (BP Location: Left Arm, Patient Position: Sitting)   Pulse 76   Ht 6' (1.829 m)   Wt 228 lb (103.4 kg)   SpO2 98%   BMI 30.92 kg/m     Wt Readings from Last 3 Encounters:  07/30/21 228 lb (103.4 kg)  06/26/21 222 lb 0.1 oz (100.7 kg)  04/29/21 239 lb (108.4 kg)     GEN:  Well nourished, well developed in no acute distress HEENT: Normal NECK: No JVD; No carotid bruits LYMPHATICS: No lymphadenopathy CARDIAC: RRR, no murmurs, no rubs, no gallops RESPIRATORY:  Clear to auscultation without rales, wheezing or rhonchi  ABDOMEN: Soft, non-tender, non-distended MUSCULOSKELETAL:  No edema; No deformity  SKIN: Warm and dry LOWER EXTREMITIES: no swelling NEUROLOGIC:  Alert and oriented x 3 PSYCHIATRIC:  Normal affect   ASSESSMENT:    1. Essential hypertension   2. OSA (obstructive sleep apnea)   3.  Dyslipidemia    PLAN:    In order of problems listed above:  Essential hypertension blood pressure slightly on the higher side but he is always very emotional coming to the office and talks a lot.  On top of that he is complaining of having some pain in his legs. Obstructive sleep apnea he is using CPAP on the regular basis. Dyslipidemia I did review K PN which only dated from  April 06, 2021 with LDL of 87 HDL 45.  We will continue present management. Recent prostatectomy now with swelling of both lower extremities calves with some tenderness.  We will do venous study today to rule out DVT.  Obviously if he does have a DVT we need to treat aggressively with anticoagulation   Medication Adjustments/Labs and Tests Ordered: Current medicines are reviewed at length with the patient today.  Concerns regarding medicines are outlined above.  No orders of the defined types were placed in this encounter.  Medication changes: No orders of the defined types were placed in this encounter.   Signed, Park Liter, MD, [facility] 07/30/2021 9:58 AM    [place]

## 2021-07-30 NOTE — Patient Instructions (Addendum)
Medication Instructions:  Your physician recommends that you continue on your current medications as directed. Please refer to the Current Medication list given to you today.  *If you need a refill on your cardiac medications before your next appointment, please call your pharmacy*   Lab Work: None Ordered If you have labs (blood work) drawn today and your tests are completely normal, you will receive your results only by: [place] (if you have MyChart) OR A paper copy in the mail If you have any lab test that is abnormal or we need to change your treatment, we will call you to review the results.   Testing/Procedures: Your physician has requested that you have a lower extremity venous duplex. This test is an ultrasound of the veins in the legs. It looks at venous blood flow that carries blood from the heart to the legs. Allow one hour for a Lower Venous exam. There are no restrictions or special instructions.  Today at [place], [place] [id number]     Follow-Up: At [facility], you and your health needs are our priority.  As part of our continuing mission to provide you with exceptional heart care, we have created designated Provider Care Teams.  These Care Teams include your primary Cardiologist (physician) and Advanced Practice Providers (APPs -  Physician Assistants and Nurse Practitioners) who all work together to provide you with the care you need, when you need it.  We recommend signing up for the patient portal called "MyChart".  Sign up information is provided on this After Visit Summary.  MyChart is used to connect with patients for Virtual Visits (Telemedicine).  Patients are able to view lab/test results, encounter notes, upcoming appointments, etc.  Non-urgent messages can be sent to your provider as well.   To learn more about what you can do with MyChart, go to [email or web address].    Your next appointment:   4 month(s)  The  format for your next appointment:   In Person  Provider:   Jenne Campus, MD    Other Instructions NA

## 2021-07-30 NOTE — Addendum Note (Signed)
Addended by: Jacobo Forest D on: 07/30/2021 10:19 AM   Modules accepted: Orders

## 2021-07-31 ENCOUNTER — Telehealth: Payer: Self-pay

## 2021-07-31 NOTE — Telephone Encounter (Signed)
-----   Message from Park Liter, MD sent at 07/31/2021  9:07 AM EDT ----- No evidence of DVT

## 2021-07-31 NOTE — Telephone Encounter (Signed)
I called the patient. Someone answer the line hang up x2

## 2021-11-10 ENCOUNTER — Encounter: Payer: Self-pay | Admitting: Cardiology

## 2021-11-10 ENCOUNTER — Ambulatory Visit: Payer: PPO | Attending: Cardiology | Admitting: Cardiology

## 2021-11-10 VITALS — BP 134/86 | HR 73 | Ht 72.0 in | Wt 238.0 lb

## 2021-11-10 DIAGNOSIS — N289 Disorder of kidney and ureter, unspecified: Secondary | ICD-10-CM

## 2021-11-10 DIAGNOSIS — G4733 Obstructive sleep apnea (adult) (pediatric): Secondary | ICD-10-CM

## 2021-11-10 DIAGNOSIS — E785 Hyperlipidemia, unspecified: Secondary | ICD-10-CM | POA: Diagnosis not present

## 2021-11-10 DIAGNOSIS — M7989 Other specified soft tissue disorders: Secondary | ICD-10-CM

## 2021-11-10 DIAGNOSIS — R0602 Shortness of breath: Secondary | ICD-10-CM

## 2021-11-10 DIAGNOSIS — I10 Essential (primary) hypertension: Secondary | ICD-10-CM | POA: Diagnosis not present

## 2021-11-10 NOTE — Addendum Note (Signed)
Addended by: Jacobo Forest D on: 11/10/2021 09:23 AM   Modules accepted: Orders

## 2021-11-10 NOTE — Progress Notes (Signed)
Cardiology Office Note:    Date:  11/10/2021   ID:  Jeremy Fox, DOB [birth year], MRN [id number]  PCP:  Pcp, No  Cardiologist:  Jenne Campus, MD    Referring MD: No ref. provider found     History of Present Illness:    Jeremy Fox is a 76 y.o. male with past medical history significant for essential hypertension finally under control, benign prostate hypertrophy status post recent prostatectomy done robotically, obstructive sleep apnea, dyslipidemia, swelling of lower extremities.  I did see him last time because of swelling of lower EXTR diabetes.  DVT study has been performed and negative.  He is coming today to my office he is doing great.  He goes to gym 3 times a week exercise and machine have no difficulty doing it.  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   amLODipine-benazepril (LOTREL) 10-40 MG capsule Take 1 capsule by mouth daily.   finasteride (PROSCAR) 5 MG tablet Take 5 mg by  mouth daily.   minoxidil (LONITEN) 2.5 MG tablet Take 1 tablet (2.5 mg total) by mouth daily.     Allergies:   Sulfa antibiotics and Sulfamethoxazole   Social History   Socioeconomic History   Marital status: Single    Spouse name: Not on file   Number of children: Not on file   Years of education: Not on file   Highest education level: Not on file  Occupational History   Not on file  Tobacco Use   Smoking status: Never    Passive exposure: Never   Smokeless tobacco: Never  Vaping Use   Vaping Use: Never used  Substance and Sexual Activity   Alcohol use: Never   Drug use: No   Sexual activity: Not on file  Other Topics Concern   Not on file  Social History Narrative   Per [facility] New Patient Packet    Diet: N/A      Caffeine: N/A      Married, if yes what year: Divorced       Do you live in a house, apartment, assisted living, condo, trailer, ect: House, one stories, and one person       Pets: No      Current/Past profession: Print production planner, Lineman      Exercise: Yes, daily, treadmill          Living Will: Yes   DNR: No   POA/HPOA: Yes  Functional Status:   Do you have difficulty bathing or dressing yourself? No   Do you have difficulty preparing food or eating? No   Do you have difficulty managing your medications?No   Do you have difficulty managing your finances? No   Do you have difficulty affording your medications? No   Social Determinants of Radio broadcast assistant Strain: Not on file  Food Insecurity: Not on file  Transportation Needs: Not on file  Physical Activity: Not on file  Stress: Not on file  Social Connections: Not on file     Family History: The patient's family history includes Diabetes in his brother; Heart attack in his father; Heart disease in his father. ROS:   Please see the history of present illness.    All 14 point review of systems negative except as described per history of present illness  EKGs/Labs/Other Studies  Reviewed:      Recent Labs: 04/05/2021: ALT 31 06/27/2021: BUN 13; Creatinine, Ser 0.95; Hemoglobin 12.0; Platelets 247; Potassium 3.7; Sodium 134  Recent Lipid Panel    Component Value Date/Time   CHOL 144 04/06/2021 0121   CHOL 173 05/01/2018 0803   TRIG 61 04/06/2021 0121   HDL 45 04/06/2021 0121   HDL 49 05/01/2018 0803   CHOLHDL 3.2 04/06/2021 0121   VLDL 12 04/06/2021 0121   LDLCALC 87 04/06/2021 0121   LDLCALC 109 (H) 05/01/2018 0803    Physical Exam:    VS:  BP 134/86   Pulse 73   Ht 6' (1.829 m)   Wt 238 lb (108 kg)   SpO2 95%   BMI 32.28 kg/m     Wt Readings from Last 3 Encounters:  11/10/21 238 lb (108 kg)  07/30/21 228 lb (103.4 kg)  06/26/21 222 lb 0.1 oz (100.7 kg)     GEN:  Well nourished, well developed in no acute distress HEENT: Normal NECK: No JVD; No carotid bruits LYMPHATICS: No lymphadenopathy CARDIAC: RRR, no murmurs, no rubs, no gallops RESPIRATORY:  Clear to auscultation without rales, wheezing or rhonchi  ABDOMEN: Soft, non-tender, non-distended MUSCULOSKELETAL:  No edema; No deformity  SKIN: Warm and dry LOWER EXTREMITIES: 1+ swelling NEUROLOGIC:  Alert and oriented x 3 PSYCHIATRIC:  Normal affect   ASSESSMENT:    1. Essential hypertension   2. Dyslipidemia   3. OSA (obstructive sleep apnea)   4. Swelling of both lower extremities    PLAN:    In order of problems listed above:  Essential hypertension blood pressure controlled continue present management. Dyslipidemia, I did review K PN which only dated from January 2023 with LDL of 87 HDL 45 reasonable cholesterol control continue present management Obstructive sleep apnea he wants to switch companies that provide him with his CPAP mask.  He got difficulty with all equipment have difficulty keeping him up and feeling lousy without it.  Also he noticed that his swelling of lower extremities much worse when he does not use his CPAP Swelling of lower extremities which is  multifactorial.  He does take Lotrel which does have amlodipine in it which can be partially responsible for swelling however on top of that his sleep apnea is not well managed.  He will try to change his CPAP machine to new equipment.  I will do proBNP and Chem-7 to make sure that this is not related to his heart.  I already brought the topic of diuretics he told me absolutely not he does not want to have it.  Medication Adjustments/Labs and Tests Ordered: Current medicines are reviewed at length with the patient today.  Concerns regarding medicines are outlined above.  No orders of the defined types were placed in this encounter.  Medication changes: No orders of the defined types were placed in this encounter.   Signed, Park Liter, MD, [facility] 11/10/2021 9:07 AM    [place]

## 2021-11-10 NOTE — Patient Instructions (Signed)
Medication Instructions:  Your physician recommends that you continue on your current medications as directed. Please refer to the Current Medication list given to you today.  *If you need a refill on your cardiac medications before your next appointment, please call your pharmacy*   Lab Work: Chem 7, ProBNP- today 2nd [place] [address] If you have labs (blood work) drawn today and your tests are completely normal, you will receive your results only by: MyChart Message (if you have MyChart) OR A paper copy in the mail If you have any lab test that is abnormal or we need to change your treatment, we will call you to review the results.   Testing/Procedures: None Ordered   Follow-Up: At [facility], you and your health needs are our priority.  As part of our continuing mission to provide you with exceptional heart care, we have created designated Provider Care Teams.  These Care Teams include your primary Cardiologist (physician) and Advanced Practice Providers (APPs -  Physician Assistants and Nurse Practitioners) who all work together to provide you with the care you need, when you need it.  We recommend signing up for the patient portal called "MyChart".  Sign up information is provided on this After Visit Summary.  MyChart is used to connect with patients for Virtual Visits (Telemedicine).  Patients are able to view lab/test results, encounter notes, upcoming appointments, etc.  Non-urgent messages can be sent to your provider as well.   To learn more about what you can do with MyChart, go to [email or web address].    Your next appointment:   6 month(s)  The format for your next appointment:   In Person  Provider:   Jenne Campus, MD    Other Instructions NA

## 2021-11-11 ENCOUNTER — Encounter: Payer: Self-pay | Admitting: Family Medicine

## 2021-11-11 ENCOUNTER — Ambulatory Visit (INDEPENDENT_AMBULATORY_CARE_PROVIDER_SITE_OTHER): Payer: PPO | Admitting: Family Medicine

## 2021-11-11 VITALS — BP 146/70 | HR 73 | Temp 97.9°F | Ht 72.0 in | Wt 240.4 lb

## 2021-11-11 DIAGNOSIS — G4733 Obstructive sleep apnea (adult) (pediatric): Secondary | ICD-10-CM

## 2021-11-11 DIAGNOSIS — M7989 Other specified soft tissue disorders: Secondary | ICD-10-CM

## 2021-11-11 DIAGNOSIS — I119 Hypertensive heart 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: Secondary | ICD-10-CM | POA: Diagnosis not present

## 2021-11-11 DIAGNOSIS — N400 Enlarged prostate without lower urinary tract symptoms: Secondary | ICD-10-CM | POA: Diagnosis not present

## 2021-11-11 DIAGNOSIS — I10 Essential (primary) hypertension: Secondary | ICD-10-CM

## 2021-11-11 DIAGNOSIS — E785 Hyperlipidemia, unspecified: Secondary | ICD-10-CM

## 2021-11-11 LAB — BASIC METABOLIC PANEL
BUN/Creatinine Ratio: 20 (ref 10–24)
BUN: 33 mg/dL — ABNORMAL HIGH (ref 8–27)
CO2: 21 mmol/L (ref 20–29)
Calcium: 8.9 mg/dL (ref 8.6–10.2)
Chloride: 105 mmol/L (ref 96–106)
Creatinine, Ser: 1.64 mg/dL — ABNORMAL HIGH (ref 0.76–1.27)
Glucose: 101 mg/dL — ABNORMAL HIGH (ref 70–99)
Potassium: 4 mmol/L (ref 3.5–5.2)
Sodium: 142 mmol/L (ref 134–144)
eGFR: 43 mL/min/{1.73_m2} — ABNORMAL LOW (ref >59)

## 2021-11-11 LAB — PRO B NATRIURETIC PEPTIDE: NT-Pro BNP: 101 pg/mL (ref 0–486)
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
## 2021-11-20 ENCOUNTER — Telehealth: Payer: Self-pay | Admitting: Cardiology

## 2021-11-20 NOTE — Addendum Note (Signed)
Addended by: Truddie Hidden on: 11/20/2021 07:47 AM   Modules accepted: Orders

## 2021-11-20 NOTE — Telephone Encounter (Signed)
Pt returning call in regards to test results. Please advise

## 2021-11-20 NOTE — Telephone Encounter (Signed)
Returned call to patient and discussed lab results.  Per nurse's note on results: Test did not show any evidence of congestive heart failure, there is some kidney dysfunction.  Please repeat Chem-7 this week.   Your physician recommends that you return for lab work in: 1 week for a BMET. You can come Monday through Friday 8:30 am to 12:00 pm and 1:15 to 4:30. You do not need to make an appointment as the order has already been placed.    Call [phone] for questions or concerns. [id number]  Patient verbalized understanding and states he will be at the lab to have blood drawn today.

## 2021-11-24 LAB — BASIC METABOLIC PANEL
BUN/Creatinine Ratio: 13 (ref 10–24)
BUN: 15 mg/dL (ref 8–27)
CO2: 22 mmol/L (ref 20–29)
Calcium: 8.9 mg/dL (ref 8.6–10.2)
Chloride: 102 mmol/L (ref 96–106)
Creatinine, Ser: 1.18 mg/dL (ref 0.76–1.27)
Glucose: 91 mg/dL (ref 70–99)
Potassium: 4.2 mmol/L (ref 3.5–5.2)
Sodium: 141 mmol/L (ref 134–144)
eGFR: 63 mL/min/{1.73_m2} (ref >59)

## 2021-11-25 ENCOUNTER — Telehealth: Payer: Self-pay

## 2021-11-25 NOTE — Telephone Encounter (Signed)
-----   Message from Park Liter, MD sent at 11/24/2021  7:47 PM EDT ----- Chem-7 looks perfect, kidney function normalized, continue present management

## 2021-11-25 NOTE — Telephone Encounter (Signed)
Patient notified of results.

## 2021-12-02 NOTE — Progress Notes (Unsigned)
12/03/21- 76 yoM never smoker for sleep evaluation courtesy of Dr Arlester Marker with concern of OSA. Now coming to re-establish. Previously followed with last OV Dr Alva/ TP,NP in 2018, doing well at that time on CPAP 16. NPSG 02/11/16- AHI 44.2/ hr, desaturation to 72%, body weight 222 lbs Medical problem list includes HTN, Ventricular Bigeminy, BPH, Obesity, Dyslipidemia, CPAP 16/ [facility] Download compliance-  Epworth score-6 Body weight today-246 lbs Covid vax-No Flu vax-declines -----Pt consult, he has used a CPAP machine since 2018 he is currently using [facility] but doesn't want to continue using them.  Current CPAP machine overheating and he has had to stop using it recently. FFM leaks. Wife confirms loud snore if not using CPAP. He knows he sleeps better on CPAP. Tough medical year with bladder outlet surgery, knee replacements. Beginning to walk and exercise again. Denies ENT surgery or lung disease. Ankle edema since knee surgeries. His cardiologist has studied/ R/O DVT Worked as a telephone lineman.  Prior to Admission medications   Medication Sig Start Date End Date Taking? Authorizing Provider  acetaminophen (TYLENOL) 500 MG tablet Take 1,000 mg by mouth every 6 (six) hours as needed for mild pain.   Yes [provider]  amLODipine-benazepril (LOTREL) 10-40 MG capsule Take 1 capsule by mouth daily. 04/27/21  Yes Park Liter, MD  finasteride (PROSCAR) 5 MG tablet Take 5 mg by mouth daily. 04/13/21  Yes [provider]  minoxidil (LONITEN) 2.5 MG tablet Take 1 tablet (2.5 mg total) by mouth daily. 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   Past Surgical History:  Procedure Laterality Date   CATARACT EXTRACTION W/ INTRAOCULAR LENS IMPLANT Bilateral    KNEE SURGERY     KNEE SURGERY Right    REPLACEMENT TOTAL KNEE Bilateral    XI ROBOTIC ASSISTED SIMPLE PROSTATECTOMY N/A 06/26/2021   Procedure: XI ROBOTIC ASSISTED SIMPLE PROSTATECTOMY;  Surgeon: Alexis Frock, MD;  Location: [facility];  Service: Urology;  Laterality: N/A;   Family History  Problem Relation Age of Onset   Heart attack Father    Heart disease Father    Diabetes Brother    Social History   Socioeconomic History   Marital status: Significant Other    Spouse name: Not on file   Number of children: 2   Years of education: Not on file   Highest education level: Not on file  Occupational History   Occupation: Retired    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 [employer] Use   Smoking status: Never    Passive exposure: Never   Smokeless tobacco: Never  Vaping Use   Vaping Use: Never used  Substance and Sexual Activity   Alcohol use: Never   Drug use: No   Sexual activity: Yes  Other Topics Concern   Not on file  Social History Narrative   Not on file   Social Determinants of Health   Financial Resource Strain: Not on file  Food Insecurity: Not  on file  Transportation Needs: Not on file  Physical Activity: Not on file  Stress: Not on file  Social Connections: Not on file  Intimate Partner Violence: Not on file   ROS-see HPI   += positive Constitutional:    weight loss, night sweats, fevers, chills, fatigue, lassitude. HEENT:    headaches, difficulty swallowing, tooth/dental problems, sore throat,       sneezing, itching, ear ache, nasal congestion, post nasal drip, snoring CV:    chest pain, orthopnea, PND, +swelling in lower extremities, anasarca,                                   dizziness, palpitations Resp:   shortness of breath with exertion or at rest.                productive cough,   non-productive cough, coughing up of blood.              change in color of mucus.  wheezing.   Skin:    rash or lesions. GI:  No-   heartburn, indigestion, abdominal pain, nausea, vomiting, diarrhea,                 change in bowel habits, loss of appetite GU: dysuria, change in color of urine, no urgency or frequency.   flank pain. MS:   joint pain, stiffness, decreased range of motion, back pain. Neuro-     nothing unusual Psych:  change in mood or affect.  depression or anxiety.   memory loss.   OBJ- Physical Exam General- Alert, Oriented, Affect-appropriate, Distress- none acute Skin- rash-none, lesions- none, excoriation- none Lymphadenopathy- none Head- atraumatic            Eyes- Gross vision intact, PERRLA, conjunctivae and secretions clear            Ears- Hearing, canals-normal            Nose- Clear, no-Septal dev, mucus, polyps, erosion, perforation             Throat- Mallampati III-IV , mucosa clear , drainage- none, tonsils- atrophic, +teeth Neck- flexible , trachea midline, no stridor , thyroid nl, carotid no bruit Chest - symmetrical excursion , unlabored           Heart/CV- RRR , no murmur , no gallop  , no rub, nl s1 s2                           - JVD- none , edema +2 bilat, stasis changes- none, varices- none           Lung- clear to P&A, wheeze- none, cough- none , dullness-none, rub- none           Chest wall-  Abd-  Br/ Gen/ Rectal- Not done, not indicated Extrem- cyanosis- none, clubbing, none, atrophy- none, strength- nl Neuro- grossly intact to observation

## 2021-12-03 ENCOUNTER — Encounter: Payer: Self-pay | Admitting: Internal Medicine

## 2021-12-03 ENCOUNTER — Ambulatory Visit (INDEPENDENT_AMBULATORY_CARE_PROVIDER_SITE_OTHER): Payer: PPO | Admitting: Internal Medicine

## 2021-12-03 DIAGNOSIS — G4733 Obstructive sleep apnea (adult) (pediatric): Secondary | ICD-10-CM | POA: Diagnosis not present

## 2021-12-03 DIAGNOSIS — M7989 Other specified soft tissue disorders: Secondary | ICD-10-CM | POA: Diagnosis not present

## 2021-12-03 NOTE — Assessment & Plan Note (Signed)
Reports evaluation for DVT was negative. After knee replacement this could be venous insufficiency, CHF, renal or vascular problem, anemia etc.He hopes getting settled with new CPAP will help.  Plan-f/u with cardiology about this.

## 2021-12-03 NOTE — Patient Instructions (Addendum)
Order- DME change from [facility] at patient request. Replace old CPAP machine, auto 10-20, mask of choice, humidifier, supplies, AirView/card  Please call if we can help

## 2021-12-03 NOTE — Assessment & Plan Note (Signed)
Benefits from CPAP. He needs replacement of machine older than 5 years that is starting to overheat. Needs mask refit. Asks to change DME from [place].

## 2021-12-11 ENCOUNTER — Encounter: Payer: Self-pay | Admitting: Family Medicine

## 2021-12-11 ENCOUNTER — Ambulatory Visit (INDEPENDENT_AMBULATORY_CARE_PROVIDER_SITE_OTHER): Payer: PPO | Admitting: Family Medicine

## 2021-12-11 VITALS — BP 144/68 | HR 73 | Temp 98.0°F | Ht 69.0 in | Wt 239.0 lb

## 2021-12-11 DIAGNOSIS — I10 Essential (primary) hypertension: Secondary | ICD-10-CM

## 2021-12-11 DIAGNOSIS — M25562 Pain in left knee: Secondary | ICD-10-CM

## 2021-12-11 DIAGNOSIS — M25561 Pain in right knee: Secondary | ICD-10-CM | POA: Diagnosis not present

## 2021-12-11 DIAGNOSIS — M7989 Other specified soft tissue disorders: Secondary | ICD-10-CM | POA: Diagnosis not present

## 2021-12-11 DIAGNOSIS — H903 Sensorineural hearing loss, bilateral: Secondary | ICD-10-CM | POA: Diagnosis not present

## 2021-12-11 LAB — CBC WITH DIFFERENTIAL/PLATELET
Basophils Absolute: 0.1 10*3/uL (ref 0.0–0.1)
Basophils Relative: 0.7 % (ref 0.0–3.0)
Eosinophils Absolute: 0.1 10*3/uL (ref 0.0–0.7)
Eosinophils Relative: 1.5 % (ref 0.0–5.0)
HCT: 43.7 % (ref 39.0–52.0)
Hemoglobin: 14.8 g/dL (ref 13.0–17.0)
Lymphocytes Relative: 26 % (ref 12.0–46.0)
Lymphs Abs: 2.1 10*3/uL (ref 0.7–4.0)
MCHC: 33.8 g/dL (ref 30.0–36.0)
MCV: 83.8 fl (ref 78.0–100.0)
Monocytes Absolute: 0.6 10*3/uL (ref 0.1–1.0)
Monocytes Relative: 7.5 % (ref 3.0–12.0)
Neutro Abs: 5.1 10*3/uL (ref 1.4–7.7)
Neutrophils Relative %: 64.3 % (ref 43.0–77.0)
Platelets: 205 10*3/uL (ref 150.0–400.0)
RBC: 5.22 Mil/uL (ref 4.22–5.81)
RDW: 13.8 % (ref 11.5–15.5)
WBC: 7.9 10*3/uL (ref 4.0–10.5)

## 2021-12-11 LAB — C-REACTIVE PROTEIN: CRP: <1 mg/dL (ref 0.5–20.0)

## 2021-12-11 LAB — SEDIMENTATION RATE: Sed Rate: 16 mm/hr (ref 0–20)

## 2021-12-11 MED ORDER — LISINOPRIL-HYDROCHLOROTHIAZIDE 20-25 MG PO TABS: 1.0000 | ORAL_TABLET | Freq: Every day | ORAL | 3 refills | Status: DC

## 2021-12-11 NOTE — Progress Notes (Signed)
[facility] [address] [place] [address] Dept: [phone] Dept Fax: [phone]  Chronic Care Office Visit  Subjective:    Patient ID: Jeremy Fox, male    DOB: May 30, 1945, 76 y.o..   MRN: [id number]  Chief Complaint  Patient presents with   Follow-up    4 week f/u.  BP @ home 127/67.      History of Present Illness:  Patient is in today for reassessment of chronic medical issues.  At his initial visit with me a month ago, Mr. Seymour's blood pressure was running higher than goal. He has a history of hypertension and hypertensive heart disease. He is managed on amlodipine-benazepril 10-40 mg daily and minoxidil 2.5 mg daily. He has had issues with swelling to his lower legs. His BNP has been normal, appearing to exclude any CHF as the cause. Mr. Schlabach notes he has been on amlodipine for years and it had not given him any trouble.  Also, Mr. Betsch has had previous bilateral total knee joint replacements. He notes that he has bilateral swelling, increased warmth,a nd pain in his knees. The increased warmth of the knees was esp. bad last night.  Past Medical History: Patient Active Problem List   Diagnosis Date Noted   Sensorineural hearing loss (SNHL) of both ears 12/11/2021   Swelling of both lower extremities 11/10/2021   Urinary retention 04/05/2021   Benign prostatic hyperplasia 10/14/2020   Class 1 obesity due to excess calories with serious comorbidity and body mass index (BMI) of 31.0 to 31.9 in adult 09/18/2020   Onychomycosis of right great toe 06/07/2019   Prominent metatarsal head, right 06/07/2019   S/P arthroscopy of right shoulder 06/05/2019   Chronic pain of both knees 05/08/2019   Chronic right shoulder pain 05/08/2019   Elevated PSA 09/05/2018   Memory loss 09/05/2018   OSA (obstructive sleep apnea) 03/29/2016   Essential hypertension 06/10/2015   Dyslipidemia 06/03/2015   Hypertensive heart  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 06/03/2015   Rotator cuff syndrome of left shoulder 06/03/2015   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   Past Surgical History:  Procedure Laterality Date   CATARACT EXTRACTION W/ INTRAOCULAR LENS IMPLANT Bilateral    KNEE SURGERY     KNEE SURGERY Right    REPLACEMENT TOTAL KNEE Bilateral    XI ROBOTIC ASSISTED SIMPLE PROSTATECTOMY N/A 06/26/2021   Procedure: XI ROBOTIC ASSISTED SIMPLE PROSTATECTOMY;  Surgeon: Alexis Frock, MD;  Location: [facility];  Service: Urology;  Laterality: N/A;   Family History  Problem Relation Age of Onset   Heart attack Father    Heart disease Father    Diabetes Brother    Outpatient Medications Prior to Visit  Medication Sig Dispense Refill   acetaminophen (TYLENOL) 500 MG tablet Take 1,000 mg by mouth every 6 (six) hours as needed for mild pain.     finasteride (PROSCAR) 5 MG tablet Take 5 mg by mouth daily.     minoxidil (LONITEN) 2.5 MG tablet Take 1 tablet (2.5 mg total) by mouth daily. 30 tablet 12   amLODipine-benazepril (LOTREL) 10-40 MG capsule Take 1 capsule by mouth daily. 90 capsule 3   No facility-administered medications prior to visit.   Allergies  Allergen Reactions   Sulfa Antibiotics Rash   Sulfamethoxazole Rash     Objective:   Today's Vitals   12/11/21 1000  BP: (!) 144/68  Pulse: 73  Temp: 98 F (36.7 C)  TempSrc: Temporal  SpO2: 97%  Weight:  239 lb (108.4 kg)  Height: _0  (1.753 m)   Body mass index is 35.29 kg/m.   General: Well developed, well nourished. No acute distress. Extremities: Well-healed surgical scars to both knees. There is enlargement of the knee joints. No   redness noted, but there is increased warmth of the knees. There is 2+ pitting edema of both lower   legs. Psych: Alert and oriented. Normal mood and affect.  Health Maintenance Due  Topic Date Due   Zoster Vaccines- Shingrix (1 of 2) Never done   TETANUS/TDAP  04/15/2021   Lab Results    Latest Ref Rng & Units  11/23/2021    9:35 AM 11/10/2021    9:47 AM 06/27/2021    4:45 AM  BMP  Glucose 70 - 99 mg/dL 91  101  154   BUN 8 - 27 mg/dL 15  33  13   Creatinine 0.76 - 1.27 mg/dL 1.18  1.64  0.95   BUN/Creat Ratio 10 - _1 Sodium 134 - 144 mmol/L 141  142  134   Potassium 3.5 - 5.2 mmol/L 4.2  4.0  3.7   Chloride 96 - 106 mmol/L 102  105  102   CO2 20 - 29 mmol/L _2 Calcium 8.6 - 10.2 mg/dL 8.9  8.9  8.0      Assessment & Plan:   1. Essential hypertension Blood pressure remains above goal. I am concerned that the amlodipine could be contributing tot he lower leg edema. I will switch him to a different combination for blood pressure control, using an ACE-I and a diuretic. I will plan to see him back in 1 month to reassess his BP.  - lisinopril-hydrochlorothiazide (ZESTORETIC) 20-25 MG tablet; Take 1 tablet by mouth daily.  Dispense: 90 tablet; Refill: 3  2. Swelling of both lower extremities As above. Will reassess in 1 month off of amlodipine.  3. Pain in both knees, unspecified chronicity The knee joints due appear to have some inflammation. I will check a CBC and ESR/CRP to assess for signs of infection. he is scheduled to see orthopedics on 10/17.  - CBC with Differential/Platelet - Sedimentation rate - C-reactive protein  4. Sensorineural hearing loss (SNHL) of both ears Mr. Galan has a history of hearing loss. He has hearing aides, but feels like they are not really working for him. I will refer him to audiology to assess his current hearing and the adequacy of his current hearing aides.  - Ambulatory referral to Audiology   Return in about 4 weeks (around 01/08/2022) for Reassessment.   Haydee Salter, MD

## 2021-12-18 ENCOUNTER — Ambulatory Visit: Payer: PPO | Attending: Audiologist | Admitting: Audiologist

## 2021-12-18 DIAGNOSIS — H903 Sensorineural hearing loss, bilateral: Secondary | ICD-10-CM | POA: Diagnosis not present

## 2021-12-18 NOTE — Procedures (Signed)
Outpatient Audiology and [facility] [place], [place]  [phone]  AUDIOLOGICAL  EVALUATION  NAME: Jeremy Fox     DOB:   [birth year]      MRN: [id number]                                                                                     DATE: 12/18/2021     REFERENT: Haydee Salter, MD STATUS: Outpatient DIAGNOSIS: Sensorineural Hearing Loss Bilaterally    History: [name] was seen for an audiological evaluation.  [name] is receiving a hearing evaluation due to concerns for inability to hear even with hearing aids on. [name] has difficulty hearing at all times evne when he wears his TruHearing aids. This difficulty began gradually. No pain or pressure reported in either ear. Tinnitus denied ears. [name] has a history of noise exposure from working for [employer] for his whole career. [name] used to have his hearing tested often for [employer], and [name] he was losing hearing on the job. [name] has tried several pairs of hearing aids. He struggles to get his current TruHearing RICS in and keep them on his ears due to his classes. [name] has lots of feedback in both ears. The TruHearing aids have 53M receivers with slit domes. No other relevant case history reported.   Evaluation:  Otoscopy showed a clear view of the tympanic membranes, bilaterally Tympanometry results were consistent with normal middle ear pressure, bilaterally   Audiometric testing was completed using conventional audiometry with insert transducer. Speech Recognition Thresholds were 70dB in the right ear and 70dB in the left ear. Word Recognition was performed 35 dB SL, scored  68% in the right ear and 52% in the left ear. Pure tone thresholds show moderate sloping to severe sensorineural hearing loss in the right ear and moderate sloping to profound sensorineural hearing loss in the left ear.   Results:  The test results were reviewed with [name]. He has a severe sensorineural  hearing loss in each ear. He has poor word recognition even at loud levels. [name] current aids have medium power speakers with partially open domes. These aids are not powerful enough to provide the volume he needs. His current aids are also whistling, this is sound leaking out. [name] cannot get sufficient volume from his current hearing aids, this and the severe level of his loss are why he cannot hear even with the aids on. With more powerful hearing aids, [name] will hear better but still not perfect. He needs to wear the aids regularly. He needs to realize even at loud levels, his speech understanding was 50-70%, so even with the aids he will not hear everything. He reported understanding.   Recommendations: 1.   Hearing aids as they are now, are not appropriate for degree of hearing loss. Recommend having current aids refit with more powerful speaker and custom earmolds. This can be done with the fitting provider. Also can be done with [facility] and [place], they will see patients with hearing aids purchased from other places or online. [name] given handout with their number and instructions on how aids need to be  changed.    53 minutes spent testing and counseling on results.   [name]  Audiologist, Au.D., CCC-A 12/18/2021  9:26 AM  Cc: Haydee Salter, MD

## 2021-12-24 ENCOUNTER — Telehealth: Payer: Self-pay | Admitting: Internal Medicine

## 2021-12-24 DIAGNOSIS — G4733 Obstructive sleep apnea (adult) (pediatric): Secondary | ICD-10-CM

## 2021-12-24 NOTE — Telephone Encounter (Signed)
Patient called because he has not heard anything about getting a new CPAP machine. I do not where an order was placed, per [facility] notes, "Order- DME change from [facility] at patient request. Replace old CPAP machine, auto 10-20, mask of choice, humidifier, supplies, AirView/card". Patient does not want to use [facility], anywhere else is fine. Please advise.

## 2021-12-24 NOTE — Telephone Encounter (Signed)
Called patient and told him that I was sending out cpap order. He states he does not want [facility] at all. Please sent CPAP order to different DME. I told him I placed in the order to go to somewhere else other then [facility]. Nothing further needed

## 2022-01-08 ENCOUNTER — Encounter: Payer: Self-pay | Admitting: Family Medicine

## 2022-01-08 ENCOUNTER — Ambulatory Visit (INDEPENDENT_AMBULATORY_CARE_PROVIDER_SITE_OTHER): Payer: PPO | Admitting: Family Medicine

## 2022-01-08 VITALS — BP 156/80 | HR 84 | Temp 97.8°F | Ht 69.0 in | Wt 240.8 lb

## 2022-01-08 DIAGNOSIS — J0110 Acute frontal sinusitis, unspecified: Secondary | ICD-10-CM | POA: Diagnosis not present

## 2022-01-08 DIAGNOSIS — H903 Sensorineural hearing loss, bilateral: Secondary | ICD-10-CM

## 2022-01-08 DIAGNOSIS — H60502 Unspecified acute noninfective otitis externa, left ear: Secondary | ICD-10-CM

## 2022-01-08 DIAGNOSIS — I10 Essential (primary) hypertension: Secondary | ICD-10-CM | POA: Diagnosis not present

## 2022-01-08 MED ORDER — LISINOPRIL-HYDROCHLOROTHIAZIDE 20-25 MG PO TABS: 1.0000 | ORAL_TABLET | Freq: Two times a day (BID) | ORAL | 3 refills | Status: DC

## 2022-01-08 MED ORDER — NEOMYCIN-POLYMYXIN-HC 3.5-10000-1 OT SOLN: 3.0000 [drp] | Freq: Four times a day (QID) | OTIC | 0 refills | Status: DC

## 2022-01-08 MED ORDER — AMOXICILLIN-POT CLAVULANATE 875-125 MG PO TABS: 1.0000 | ORAL_TABLET | Freq: Two times a day (BID) | ORAL | 0 refills | Status: DC

## 2022-01-08 NOTE — Progress Notes (Signed)
[facility] [address] [place] [address] Dept: [phone] Dept Fax: [phone]  Chronic Care Office Visit  Subjective:    Patient ID: Jeremy Fox, male    DOB: Aug 03, 1945, 76 y.o..   MRN: [id number]  Chief Complaint  Patient presents with   Follow-up    4 week f/u.  C/o having sinus issues and HA's. No OTC meds taken     History of Present Illness:  Patient is in today for reassessment of chronic medical issues.  Mr. Levesque has a history of hypertension. He is currently managed on Zestoretic 20-25 mg daily and minoxidil 2.5 mg daily. He had been on amlodipine-benazepril, but was having issues with swelling to his lower legs. He notes this has resolved with the medication change.  Mr. Szatkowski did go recently to see the audiologist. They are fitting him for new hearing aids. He notes the left external ear canal has felt irritated since the exam and he wonders if he has an ear infection.  Mr. Loadholt also notes that he is having issues with sinus congestion for the past 3-4 weeks. he complains esp. of right-sided sinus pain. This is worsened when he bends over.  Past Medical History: Patient Active Problem List   Diagnosis Date Noted   Sensorineural hearing loss (SNHL) of both ears 12/11/2021   Swelling of both lower extremities 11/10/2021   Urinary retention 04/05/2021   Benign prostatic hyperplasia 10/14/2020   Class 1 obesity due to excess calories with serious comorbidity and body mass index (BMI) of 31.0 to 31.9 in adult 09/18/2020   Onychomycosis of right great toe 06/07/2019   Prominent metatarsal head, right 06/07/2019   S/P arthroscopy of right shoulder 06/05/2019   Chronic pain of both knees 05/08/2019   Chronic right shoulder pain 05/08/2019   Elevated PSA 09/05/2018   Memory loss 09/05/2018   OSA (obstructive sleep apnea) 03/29/2016   Essential hypertension 06/10/2015   Dyslipidemia 06/03/2015    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/03/2015   Rotator cuff syndrome of left shoulder 06/03/2015   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   Past Surgical History:  Procedure Laterality Date   CATARACT EXTRACTION W/ INTRAOCULAR LENS IMPLANT Bilateral    KNEE SURGERY     KNEE SURGERY Right    REPLACEMENT TOTAL KNEE Bilateral    XI ROBOTIC ASSISTED SIMPLE PROSTATECTOMY N/A 06/26/2021   Procedure: XI ROBOTIC ASSISTED SIMPLE PROSTATECTOMY;  Surgeon: Alexis Frock, MD;  Location: [facility];  Service: Urology;  Laterality: N/A;   Family History  Problem Relation Age of Onset   Heart attack Father    Heart disease Father    Diabetes Brother    Outpatient Medications Prior to Visit  Medication Sig Dispense Refill   acetaminophen (TYLENOL) 500 MG tablet Take 1,000 mg by mouth every 6 (six) hours as needed for mild pain.     finasteride (PROSCAR) 5 MG tablet Take 5 mg by mouth daily.     minoxidil (LONITEN) 2.5 MG tablet Take 1 tablet (2.5 mg total) by mouth daily. 30 tablet 12   lisinopril-hydrochlorothiazide (ZESTORETIC) 20-25 MG tablet Take 1 tablet by mouth daily. 90 tablet 3   No facility-administered medications prior to visit.   Allergies  Allergen Reactions   Sulfa Antibiotics Rash   Sulfamethoxazole Rash    Objective:   Today's Vitals   01/08/22 0846  BP: (!) 170/80  Pulse: 84  Temp: 97.8 F (36.6 C)  TempSrc: Temporal  SpO2: 96%  Weight: 240 lb 12.8 oz (109.2 kg)  Height: '5\' 9"'$  (1.753 m)   Body mass index is 35.56 kg/m.   General: Well developed, well nourished. No acute distress. HEENT: Normocephalic, non-traumatic. External ears normal. The left EAC is mildly swollen.   TM is normal. Nose  with mild congestion and rhinorrhea. There is pain on percussion over   the right frontal and maxillary sinuses. Oropharynx clear. Good dentition. Extremities: No edema noted. Psych: Alert and oriented. Normal mood and affect.  Health Maintenance Due  Topic Date Due    Zoster Vaccines- Shingrix (1 of 2) Never done   Medicare Annual Wellness (AWV)  06/02/2016   TETANUS/TDAP  04/15/2021     Assessment & Plan:   1. Essential hypertension Edema has resolved with switching off of amlodipine. However, BP is high today. I will go ahead and have Mr. Sawatzky increase his Zestoretic to bid. Plan to check a BMP at his next visit.  - lisinopril-hydrochlorothiazide (ZESTORETIC) 20-25 MG tablet; Take 1 tablet by mouth in the morning and at bedtime.  Dispense: 180 tablet; Refill: 3  2. Sensorineural hearing loss (SNHL) of both ears New hearing aides are coming next week.  3. Acute otitis externa of left ear, unspecified type The left EAC does appear swollen. We will try some Cortisporin drops to see if this will resolve.  - neomycin-polymyxin-hydrocortisone (CORTISPORIN) OTIC solution; Place 3 drops into the left ear 4 (four) times daily.  Dispense: 10 mL; Refill: 0  4. Acute non-recurrent frontal sinusitis I will prescribe a course of antibiotics for this apparent sinusitis.  - amoxicillin-clavulanate (AUGMENTIN) 875-125 MG tablet; Take 1 tablet by mouth 2 (two) times daily.  Dispense: 20 tablet; Refill: 0   Return in about 6 weeks (around 02/19/2022) for Reassessment.   Haydee Salter, MD

## 2022-03-02 ENCOUNTER — Encounter: Payer: Self-pay | Admitting: Family Medicine

## 2022-03-02 ENCOUNTER — Ambulatory Visit (INDEPENDENT_AMBULATORY_CARE_PROVIDER_SITE_OTHER): Payer: PPO | Admitting: Family Medicine

## 2022-03-02 VITALS — BP 136/78 | HR 90 | Temp 97.8°F | Ht 72.0 in | Wt 240.0 lb

## 2022-03-02 DIAGNOSIS — I10 Essential (primary) hypertension: Secondary | ICD-10-CM

## 2022-03-02 DIAGNOSIS — S50812A Abrasion of left forearm, initial encounter: Secondary | ICD-10-CM

## 2022-03-02 DIAGNOSIS — G5601 Carpal tunnel syndrome, right upper limb: Secondary | ICD-10-CM | POA: Diagnosis not present

## 2022-03-02 DIAGNOSIS — S301XXA Contusion of abdominal wall, initial encounter: Secondary | ICD-10-CM | POA: Diagnosis not present

## 2022-03-02 DIAGNOSIS — G4733 Obstructive sleep apnea (adult) (pediatric): Secondary | ICD-10-CM

## 2022-03-02 DIAGNOSIS — H903 Sensorineural hearing loss, bilateral: Secondary | ICD-10-CM

## 2022-03-02 MED ORDER — MINOXIDIL 2.5 MG PO TABS: 2.5000 mg | ORAL_TABLET | Freq: Every day | ORAL | 12 refills | Status: DC

## 2022-03-02 NOTE — Progress Notes (Signed)
[facility] [address] [place] [address] Dept: [phone] Dept Fax: [phone]  Chronic Care Office Visit  Subjective:    Patient ID: Jeremy Fox, male    DOB: Oct 06, 1945, 76 y.o..   MRN: [id number]  Chief Complaint  Patient presents with   Follow-up    6 week f/u.   C/o having issues with Rt hand.      History of Present Illness:  Patient is in today for reassessment of chronic medical issues.  Jeremy Fox has a history of hypertension. He is currently managed on Zestoretic 20-25 mg twice daily (increased at last visit) and minoxidil 2.5 mg daily. He is very pleased at his current blood pressure.   Jeremy Fox is now fitted with new hearing aids. He is very pleased with is hearing.   Jeremy Fox has been able to get his CPAP working. He is feeling much more rested int he morning. His SO notes his mood is improved.  Jeremy Fox states that he was trying to handle on of his [place] fowl recently. The hen turned and scratched his left forearm. He has not seen any drainage from this.  Jeremy Fox notes he was recently trying to free a pin on an attachment to his tractor. This was frozen in place. He eventually used a rod/post driver on it. As he as using his abdomen to bump the driver, he ended up with a tender swollen ara on the right lower quadrant.  Jeremy Fox notes an issue with numbness in his right hand. He notes this is intermittently worse at times. He had a deep cut to the hand some years ago, but this has not necessarily been present since then.  Past Medical History: Patient Active Problem List   Diagnosis Date Noted   Sensorineural hearing loss (SNHL) of both ears 12/11/2021   Swelling of both lower extremities 11/10/2021   Urinary retention 04/05/2021   Benign prostatic hyperplasia 10/14/2020   Class 1 obesity due to excess calories with serious comorbidity and body mass index (BMI) of 31.0 to 31.9 in  adult 09/18/2020   Onychomycosis of right great toe 06/07/2019   Prominent metatarsal head, right 06/07/2019   S/P arthroscopy of right shoulder 06/05/2019   Chronic pain of both knees 05/08/2019   Chronic right shoulder pain 05/08/2019   Elevated PSA 09/05/2018   Memory loss 09/05/2018   OSA (obstructive sleep apnea) 03/29/2016   Essential hypertension 06/10/2015   Dyslipidemia 06/03/2015   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/03/2015   Rotator cuff syndrome of left shoulder 06/03/2015   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   Past Surgical History:  Procedure Laterality Date   CATARACT EXTRACTION W/ INTRAOCULAR LENS IMPLANT Bilateral    KNEE SURGERY     KNEE SURGERY Right    REPLACEMENT TOTAL KNEE Bilateral    XI ROBOTIC ASSISTED SIMPLE PROSTATECTOMY N/A 06/26/2021   Procedure: XI ROBOTIC ASSISTED SIMPLE PROSTATECTOMY;  Surgeon: Alexis Frock, MD;  Location: [facility];  Service: Urology;  Laterality: N/A;   Family History  Problem Relation Age of Onset   Heart attack Father    Heart disease Father    Diabetes Brother    Outpatient Medications Prior to Visit  Medication Sig Dispense Refill   acetaminophen (TYLENOL) 500 MG tablet Take 1,000 mg by mouth every 6 (six) hours as needed for mild pain.     finasteride (PROSCAR) 5 MG tablet Take 5 mg by mouth daily.  lisinopril-hydrochlorothiazide (ZESTORETIC) 20-25 MG tablet Take 1 tablet by mouth in the morning and at bedtime. 180 tablet 3   neomycin-polymyxin-hydrocortisone (CORTISPORIN) OTIC solution Place 3 drops into the left ear 4 (four) times daily. 10 mL 0   minoxidil (LONITEN) 2.5 MG tablet Take 1 tablet (2.5 mg total) by mouth daily. 30 tablet 12   amoxicillin-clavulanate (AUGMENTIN) 875-125 MG tablet Take 1 tablet by mouth 2 (two) times daily. 20 tablet 0   No facility-administered medications prior to visit.   Allergies  Allergen Reactions   Sulfa Antibiotics Rash   Sulfamethoxazole Rash     Objective:    Today's Vitals   03/02/22 0800  BP: 136/78  Pulse: 90  Temp: 97.8 F (36.6 C)  TempSrc: Temporal  SpO2: 98%  Weight: 240 lb (108.9 kg)  Height: 6' (1.829 m)   Body mass index is 32.55 kg/m.   General: Well developed, well nourished. No acute distress. Extremities: There is a long scratch tot he palmar aspect of the left forarm. There is mild redness aroudn   the scratch, but no fluctuance and no sign of drainage. The riught hand shows numbness primarily   involving the 1st-3rd finger. Muscle development is normal. There is an old scar to the palm. + Phalen's  and Tinel's test. Abdomen. There is a 3-4 cm nodule palpable under the skin in the RLQ. There is some mild bruising   present. Psych: Alert and oriented. Normal mood and affect.  Health Maintenance Due  Topic Date Due   Zoster Vaccines- Shingrix (1 of 2) Never done   Medicare Annual Wellness (AWV)  06/02/2016     Assessment & Plan:   1. Essential hypertension Blood pressure is much improved at this point. I will continue Jeremy Fox on minoxidil 2.5 mg daily and Zestoretic 20-25 mg bid.  - minoxidil (LONITEN) 2.5 MG tablet; Take 1 tablet (2.5 mg total) by mouth daily.  Dispense: 30 tablet; Refill: 12  2. Contusion of abdominal wall, initial encounter The area on the lower right abdominal wall appears to be a bruise. I recommend he use heat on this until resolved.  3. Scratch of forearm, left, initial encounter The scratch on the forearm. Is not clearly infected. I recommend routine wound care and observation. if this should start draining pus or he sees streaking going up the arm, he should follow up for reassessment.  4. Carpal tunnel syndrome of right wrist Exam and history are consistent with CTS. I recommended he avoid repetitive motions. He should obtain a wrist splint and use this when working to reduce the wrist motion. As he has had difficult to manage blood pressure, I do not recommend he take NSAIDs. I  offered referral to a hand specialist, but he declines this for now.  5. Sensorineural hearing loss (SNHL) of both ears Improved with use of hearing aides.  6. OSA (obstructive sleep apnea) Improved now on CPAP.   Return in about 3 months (around 06/01/2022) for Reassessment.   Haydee Salter, MD

## 2022-03-02 NOTE — Patient Instructions (Signed)
Carpal Tunnel Syndrome  Carpal tunnel syndrome is a condition that causes pain, weakness, and numbness in your hand and arm. Numbness is when you cannot feel an area in your body. The carpal tunnel is a narrow area that is on the palm side of your wrist. Repeated wrist motion or certain diseases may cause swelling in the tunnel. This swelling can pinch the main nerve in the wrist. This nerve is called the median nerve. What are the causes? This condition may be caused by: Moving your hand and wrist over and over again while doing a task. Injury to the wrist. Arthritis. A sac of fluid (cyst) or abnormal growth (tumor) in the carpal tunnel. Fluid buildup during pregnancy. Use of tools that vibrate. Sometimes the cause is not known. What increases the risk? The following factors may make you more likely to have this condition: Having a job that makes you do these things: Move your hand over and over again. Work with tools that vibrate, such as drills or sanders. Being a woman. Having diabetes, obesity, thyroid problems, or kidney failure. What are the signs or symptoms? Symptoms of this condition include: A tingling feeling in your fingers. Tingling or loss of feeling in your hand. Pain in your entire arm. This pain may get worse when you bend your wrist and elbow for a long time. Pain in your wrist that goes up your arm to your shoulder. Pain that goes down into your palm or fingers. Weakness in your hands. You may find it hard to grab and hold items. You may feel worse at night. How is this treated? This condition may be treated with: Lifestyle changes. You will be asked to stop or change the activity that caused your problem. Doing exercises and activities that make bones, muscles, and tendons stronger (physical therapy). Learning how to use your hand again (occupational therapy). Medicines for pain and swelling. You may have injections in your wrist. A wrist splint or  brace. Surgery. Follow these instructions at home: If you have a splint or brace: Wear the splint or brace as told by your doctor. Take it off only as told by your doctor. Loosen the splint if your fingers: Tingle. Become numb. Turn cold and blue. Keep the splint or brace clean. If the splint or brace is not waterproof: Do not let it get wet. Cover it with a watertight covering when you take a bath or a shower. Managing pain, stiffness, and swelling If told, put ice on the painful area: If you have a removable splint or brace, remove it as told by your doctor. Put ice in a plastic bag. Place a towel between your skin and the bag. Leave the ice on for 20 minutes, 2-3 times per day. Do not fall asleep with the cold pack on your skin. Take off the ice if your skin turns bright red. This is very important. If you cannot feel pain, heat, or cold, you have a greater risk of damage to the area. Move your fingers often to reduce stiffness and swelling. General instructions Take over-the-counter and prescription medicines only as told by your doctor. Rest your wrist from any activity that may cause pain. If needed, talk with your boss at work about changes that can help your wrist heal. Do exercises as told by your doctor, physical therapist, or occupational therapist. Keep all follow-up visits. Contact a doctor if: You have new symptoms. Medicine does not help your pain. Your symptoms get worse. Get help right  away if: You have very bad numbness or tingling in your wrist or hand. Summary Carpal tunnel syndrome is a condition that causes pain in your hand and arm. It is often caused by repeated wrist motions. Lifestyle changes and medicines are used to treat this problem. Surgery may help in very bad cases. Follow your doctor's instructions about wearing a splint, resting your wrist, keeping follow-up visits, and calling for help. This information is not intended to replace advice given  to you by your health care provider. Make sure you discuss any questions you have with your health care provider. Document Revised: 07/05/2019 Document Reviewed: 07/05/2019 Elsevier Patient Education  [employer].

## 2022-03-12 NOTE — Progress Notes (Signed)
12/03/21- 76 yoM never smoker for sleep evaluation courtesy of Dr Herbie Drape with concern of OSA. Now coming to re-establish. Previously followed with last OV Dr Alva/ TP,NP in 2018, doing well at that time on CPAP 16. NPSG 02/11/16- AHI 44.2/ hr, desaturation to 72%, body weight 222 lbs Medical problem list includes HTN, Ventricular Bigeminy, BPH, Obesity, Dyslipidemia, CPAP 16/ [employer] Download compliance-  Epworth score-6 Body weight today-246 lbs Covid vax-No Flu vax-declines -----Pt consult, he has used a CPAP machine since 2018 he is currently using [employer] but doesn't want to continue using them.  Current CPAP machine overheating and he has had to stop using it recently. FFM leaks. Wife confirms loud snore if not using CPAP. He knows he sleeps better on CPAP. Tough medical year with bladder outlet surgery, knee replacements. Beginning to walk and exercise again. Denies ENT surgery or lung disease. Ankle edema since knee surgeries. His cardiologist has studied/ R/O DVT Worked as a telephone lineman.  03/15/22- 76 yoM never smoker followed for OSA, complicated by HTN, Ventricular Bigeminy, BPH, Obesity, Dyslipidemia, CPAP auto 10-20/ [facility]    replaced 12/24/21    AirSense 11 Download compliance-97%, AHI 1.5/ hr Body weight today-243 lbs Covid vax- Flu vax-                                                                        Wife here He is pleased with his new AirSense 11 machine and with DME change to [facility]. Download reviewed. Hearing better with current hearing aids. Implies this puts in in better mood.  He is happy also with BP control and credits Dr Veto Kemps for changing his BP med.   ROS-see HPI   += positive Constitutional:    weight loss, night sweats, fevers, chills, fatigue, lassitude. HEENT:    headaches, difficulty swallowing, tooth/dental problems, sore throat,       sneezing, itching, ear ache, nasal congestion, post nasal drip, snoring CV:    chest pain, orthopnea,  PND, +swelling in lower extremities, anasarca,                                   dizziness, palpitations Resp:   shortness of breath with exertion or at rest.                productive cough,   non-productive cough, coughing up of blood.              change in color of mucus.  wheezing.   Skin:    rash or lesions. GI:  No-   heartburn, indigestion, abdominal pain, nausea, vomiting, diarrhea,                 change in bowel habits, loss of appetite GU: dysuria, change in color of urine, no urgency or frequency.   flank pain. MS:   joint pain, stiffness, decreased range of motion, back pain. Neuro-     nothing unusual Psych:  change in mood or affect.  depression or anxiety.   memory loss.   OBJ- Physical Exam General- Alert, Oriented, Affect-appropriate, Distress- none acute Skin- rash-none, lesions- none, excoriation- none Lymphadenopathy- none Head- atraumatic  Eyes- Gross vision intact, PERRLA, conjunctivae and secretions clear            Ears- +Hearing aids            Nose- Clear, no-Septal dev, mucus, polyps, erosion, perforation             Throat- Mallampati III-IV , mucosa clear , drainage- none, tonsils- atrophic, +teeth Neck- flexible , trachea midline, no stridor , thyroid nl, carotid no bruit Chest - symmetrical excursion , unlabored           Heart/CV- RRR , no murmur , no gallop  , no rub, nl s1 s2                           - JVD- none , edema +2 bilat, stasis changes- none, varices- none           Lung- clear to P&A, wheeze- none, cough- none , dullness-none, rub- none           Chest wall-  Abd-  Br/ Gen/ Rectal- Not done, not indicated Extrem- cyanosis- none, clubbing, none, atrophy- none, strength- nl Neuro- grossly intact to observation

## 2022-03-15 ENCOUNTER — Encounter: Payer: Self-pay | Admitting: Internal Medicine

## 2022-03-15 ENCOUNTER — Ambulatory Visit (INDEPENDENT_AMBULATORY_CARE_PROVIDER_SITE_OTHER): Payer: PPO | Admitting: Internal Medicine

## 2022-03-15 VITALS — BP 130/82 | HR 75 | Temp 97.9°F | Ht 72.0 in | Wt 243.2 lb

## 2022-03-15 DIAGNOSIS — G4733 Obstructive sleep apnea (adult) (pediatric): Secondary | ICD-10-CM

## 2022-03-15 DIAGNOSIS — I10 Essential (primary) hypertension: Secondary | ICD-10-CM | POA: Diagnosis not present

## 2022-03-15 NOTE — Patient Instructions (Signed)
We can continue CPAP auto 10-20 with [facility]  Please call if we can help

## 2022-03-15 NOTE — Assessment & Plan Note (Signed)
130/82 at this visit. He is pleased with Dr Juliane Lack management and thinks his CPAP also helps.

## 2022-03-15 NOTE — Assessment & Plan Note (Signed)
Benefits from CPAP with good compliance and control Plan- continue autp 10-20, [facility]

## 2022-03-29 ENCOUNTER — Ambulatory Visit: Payer: PPO

## 2022-05-12 ENCOUNTER — Ambulatory Visit: Payer: PPO | Admitting: Cardiology

## 2022-05-18 ENCOUNTER — Ambulatory Visit: Payer: PPO | Admitting: Cardiology

## 2022-06-01 ENCOUNTER — Encounter: Payer: Self-pay | Admitting: Family Medicine

## 2022-06-01 ENCOUNTER — Ambulatory Visit (INDEPENDENT_AMBULATORY_CARE_PROVIDER_SITE_OTHER): Payer: PPO | Admitting: Family Medicine

## 2022-06-01 VITALS — BP 136/78 | HR 67 | Temp 97.9°F | Ht 72.0 in | Wt 241.6 lb

## 2022-06-01 DIAGNOSIS — K439 Ventral hernia without obstruction or gangrene: Secondary | ICD-10-CM | POA: Diagnosis not present

## 2022-06-01 DIAGNOSIS — G4733 Obstructive sleep apnea (adult) (pediatric): Secondary | ICD-10-CM

## 2022-06-01 DIAGNOSIS — D234 Other benign neoplasm of skin of scalp and neck: Secondary | ICD-10-CM

## 2022-06-01 DIAGNOSIS — I10 Essential (primary) hypertension: Secondary | ICD-10-CM

## 2022-06-01 NOTE — Assessment & Plan Note (Signed)
Blood pressure is adequately controlled. Continue  Zestoretic 20-25 mg daily and minoxidil 2.5 mg daily.

## 2022-06-01 NOTE — Assessment & Plan Note (Addendum)
On exam Mr. Jeremy Fox has a ventral hernia. I suspect this developed after the strain he had in Dec. This has been painful at times for him. The hernia is reducible. I recommend he avoid heavy lifting at this point. I will refer him to surgery for evaluation, as he likely needs a hernia repair.

## 2022-06-01 NOTE — Assessment & Plan Note (Signed)
Managed well on CPAP.

## 2022-06-01 NOTE — Progress Notes (Signed)
[facility] [address] [place] [address] Dept: [phone] Dept Fax: [phone]  Chronic Care Office Visit  Subjective:    Patient ID: Jeremy Fox, male    DOB: 04-Mar-1946, 77 y.o..   MRN: [id number]  Chief Complaint  Patient presents with   Medical Management of Chronic Issues    3 month f/u.    History of Present Illness:  Patient is in today for reassessment of chronic medical issues.  Jeremy Fox has a history of hypertension. He is currently managed on Zestoretic 20-25 mg twice daily and minoxidil 2.5 mg daily. He is very pleased at his current blood pressure.   Jeremy Fox has a history of sleep apnea. He has followed up with Jeremy Fox and is pleased with his new CPAP. He is feeling well rested in the morning.   Jeremy Fox complains of a swollen, tender area in the right lower abdomen. 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 However, at his last visit in Dec., he noted he was recently trying to free a pin on an attachment to his tractor. This was frozen in place. He eventually used a rod/post driver on it. As he as using his abdomen to bump the driver, he ended up with a tender swollen ara on the right lower quadrant. At the time this appeared to be only a bruise. he does do some weight lifting and he thinks this is making his abdomen swell more.   Jeremy Fox notes a lesion on his left scalp. He says this bleeds periodically.  Past Medical History: Patient Active Problem List   Diagnosis Date Noted   Sensorineural hearing loss (SNHL) of both ears 12/11/2021   Swelling of both lower extremities 11/10/2021   Urinary retention 04/05/2021   Benign prostatic hyperplasia 10/14/2020   Class 1 obesity due to excess calories with serious comorbidity and body mass index (BMI) of 31.0 to 31.9 in adult 09/18/2020   Onychomycosis of right great toe 06/07/2019   Prominent metatarsal head,  right 06/07/2019   S/P arthroscopy of right shoulder 06/05/2019   Chronic pain of both knees 05/08/2019   Chronic right shoulder pain 05/08/2019   Elevated PSA 09/05/2018   Memory loss 09/05/2018   OSA (obstructive sleep apnea) 03/29/2016   Essential hypertension 06/10/2015   Dyslipidemia 06/03/2015   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/03/2015   Rotator cuff syndrome of left shoulder 06/03/2015   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   Past Surgical History:  Procedure Laterality Date   CATARACT EXTRACTION W/ INTRAOCULAR LENS IMPLANT Bilateral    KNEE SURGERY     KNEE SURGERY Right    REPLACEMENT TOTAL KNEE Bilateral    XI ROBOTIC ASSISTED SIMPLE PROSTATECTOMY N/A 06/26/2021   Procedure: XI ROBOTIC ASSISTED SIMPLE PROSTATECTOMY;  Surgeon: Alexis Frock, MD;  Location: [facility];  Service: Urology;  Laterality: N/A;   Family History  Problem Relation Age of Onset   Heart attack Father    Heart disease Father    Diabetes Brother    Outpatient Medications Prior to Visit  Medication Sig Dispense Refill   acetaminophen (TYLENOL) 500 MG tablet Take 1,000 mg by mouth every 6 (six) hours as needed for mild pain.     finasteride (PROSCAR) 5 MG tablet Take 5 mg by mouth daily.     lisinopril-hydrochlorothiazide (ZESTORETIC) 20-25 MG tablet Take 1 tablet by mouth in the morning and at bedtime. 180 tablet 3  minoxidil (LONITEN) 2.5 MG tablet Take 1 tablet (2.5 mg total) by mouth daily. 30 tablet 12   neomycin-polymyxin-hydrocortisone (CORTISPORIN) OTIC solution Place 3 drops into the left ear 4 (four) times daily. 10 mL 0   No facility-administered medications prior to visit.   Allergies  Allergen Reactions   Sulfa Antibiotics Rash   Sulfamethoxazole Rash   Objective:   Today's Vitals   06/01/22 0814  BP: 136/78  Pulse: 67  Temp: 97.9 F (36.6 C)  TempSrc: Temporal  SpO2: 98%  Weight: 241 lb 9.6 oz (109.6 kg)  Height: 6' (1.829 m)   Body mass index is 32.77  kg/m.   General: Well developed, well nourished. No acute distress. Scalp: There is a 1 cm, irregular plaque with some mild scabbing on the left parietal scalp. Abdomen: Soft. There is a 6-7 cm raised bulge to the right lower abdominal wall near the umbilicus. This is more   prominent with Valsalva. I can reduce the bulge and can feel a sensation as of fluid moving with this. There appears   to be a 3 cm defect int he anterior abdominal wall. Psych: Alert and oriented. Normal mood and affect.  Health Maintenance Due  Topic Date Due   Medicare Annual Wellness (AWV)  06/02/2016     Assessment & Plan:   Problem List Items Addressed This Visit       Cardiovascular and Mediastinum   Essential hypertension    Blood pressure is adequately controlled. Continue  Zestoretic 20-25 mg daily and minoxidil 2.5 mg daily.        Respiratory   OSA (obstructive sleep apnea)    Managed well on CPAP.        Musculoskeletal and Integument   Dermal nevus of parietal region of scalp    The lesion on the left scalp appears to be a skin cancer, possible a squamous cell carcinoma. I will refer him to dermatology for evaluation and likely excision.      Relevant Orders   Ambulatory referral to Dermatology     Other   Ventral hernia without obstruction or gangrene - Primary    On exam Jeremy Fox has a ventral hernia. I suspect this developed after the strain he had in Dec. This has been painful at times for him. The hernia is reducible. I recommend he avoid heavy lifting at this point. I will refer him to surgery for evaluation, as he likely needs a hernia repair.      Relevant Orders   Ambulatory referral to General Surgery    Return in about 3 months (around 09/01/2022) for Reassessment.   Haydee Salter, MD

## 2022-06-01 NOTE — Assessment & Plan Note (Signed)
The lesion on the left scalp appears to be a skin cancer, possible a squamous cell carcinoma. I will refer him to dermatology for evaluation and likely excision.

## 2022-06-09 ENCOUNTER — Telehealth: Payer: Self-pay

## 2022-06-09 ENCOUNTER — Telehealth: Payer: Self-pay | Admitting: Family Medicine

## 2022-06-09 NOTE — Telephone Encounter (Signed)
error 

## 2022-06-09 NOTE — Telephone Encounter (Signed)
Spoke to patient, gave him the number to the dermatologist in [place]: J. Loyal Jacobson, MD in [place]. Dm/cma

## 2022-06-23 ENCOUNTER — Encounter: Payer: Self-pay | Admitting: Family Medicine

## 2022-06-25 ENCOUNTER — Telehealth: Payer: Self-pay

## 2022-06-25 NOTE — Telephone Encounter (Signed)
   [facility] Pre-operative Risk Assessment    Request for surgical clearance:  What type of surgery is being performed? Hernia Repair    When is this surgery scheduled? TBD   What type of clearance is required (medical clearance vs. Pharmacy clearance to hold med vs. Both)? Both  Are there any medications that need to be held prior to surgery and how long?Not specified   Practice name and name of physician performing surgery? Dr. Gaynelle Adu at [name]   What is your office phone number: [phone]    7.   What is your office fax number: [phone]  8.   Anesthesia type (None, local, MAC, general) ? General Anesthesia   Jeremy Fox 06/25/2022, 4:00 PM  _________________________________________________________________   (provider comments below)

## 2022-06-28 ENCOUNTER — Telehealth: Payer: Self-pay | Admitting: *Deleted

## 2022-06-28 ENCOUNTER — Telehealth: Payer: Self-pay | Admitting: Family Medicine

## 2022-06-28 NOTE — Telephone Encounter (Signed)
Spoke with patient, will look to see if we received the form.  If not will have to call to get them to fax it to [place]. Dm/cma

## 2022-06-28 NOTE — Telephone Encounter (Signed)
[facility], they did send form to his Cardiologist to get cardiac clearance.  Advised patient of this.   Dm/cma

## 2022-06-28 NOTE — Telephone Encounter (Signed)
Per Dr Janina Mayo note, they sent a cardia clearance to Cardiology.  Dm/cma

## 2022-06-28 NOTE — Telephone Encounter (Signed)
Primary Cardiologist:Robert Bing Matter, MD   Preoperative team, please contact this patient and set up a phone call appointment for further preoperative risk assessment. Please obtain consent and complete medication review. Thank you for your help.   I confirm that guidance regarding antiplatelet and oral anticoagulation therapy has been completed and, if necessary, noted below (none requested).   [name] Aland, NP-C  06/28/2022, 7:41 AM [address] Office [phone] Fax [phone]

## 2022-06-28 NOTE — Telephone Encounter (Signed)
Pt said give him a call . Pt is having a hernia surgery and ned dr Veto Kemps to sign paperwork or schedule the pt with dr Janina Mayo [facility]

## 2022-06-28 NOTE — Telephone Encounter (Signed)
I s/w the pt who is very HOH. He has been scheduled for tele pre op appt 07/02/22 @ 9:20. Med rec and consent are done.     Patient Consent for Virtual Visit        MATTISON STUCKEY has provided verbal consent on 06/28/2022 for a virtual visit (video or telephone).   CONSENT FOR VIRTUAL VISIT FOR:  Aseem D Onorato  By participating in this virtual visit I agree to the following:  I hereby voluntarily request, consent and authorize [facility] and its employed or contracted physicians, physician assistants, nurse practitioners or other licensed health care professionals (the Practitioner), to provide me with telemedicine health care services (the "Services") as deemed necessary by the treating Practitioner. I acknowledge and consent to receive the Services by the Practitioner via telemedicine. I understand that the telemedicine visit will involve communicating with the Practitioner through live audiovisual communication technology and the disclosure of certain medical information by electronic transmission. I acknowledge that I have been given the opportunity to request an in-person assessment or other available alternative prior to the telemedicine visit and am voluntarily participating in the telemedicine visit.  I understand that I have the right to withhold or withdraw my consent to the use of telemedicine in the course of my care at any time, without affecting my right to future care or treatment, and that the Practitioner or I may terminate the telemedicine visit at any time. I understand that I have the right to inspect all information obtained and/or recorded in the course of the telemedicine visit and may receive copies of available information for a reasonable fee.  I understand that some of the potential risks of receiving the Services via telemedicine include:  Delay or interruption in medical evaluation due to technological equipment failure or disruption; Information transmitted  may not be sufficient (e.g. poor resolution of images) to allow for appropriate medical decision making by the Practitioner; and/or  In rare instances, security protocols could fail, causing a breach of personal health information.  Furthermore, I acknowledge that it is my responsibility to provide information about my medical history, conditions and care that is complete and accurate to the best of my ability. I acknowledge that Practitioner's advice, recommendations, and/or decision may be based on factors not within their control, such as incomplete or inaccurate data provided by me or distortions of diagnostic images or specimens that may result from electronic transmissions. I understand that the practice of medicine is not an exact science and that Practitioner makes no warranties or guarantees regarding treatment outcomes. I acknowledge that a copy of this consent can be made available to me via my patient portal [facility] MyChart), or I can request a printed copy by calling the office of [facility].    I understand that my insurance will be billed for this visit.   I have read or had this consent read to me. I understand the contents of this consent, which adequately explains the benefits and risks of the Services being provided via telemedicine.  I have been provided ample opportunity to ask questions regarding this consent and the Services and have had my questions answered to my satisfaction. I give my informed consent for the services to be provided through the use of telemedicine in my medical care

## 2022-06-28 NOTE — Telephone Encounter (Signed)
I s/w the pt who is very HOH. He has been scheduled for tele pre op appt 07/02/22 @ 9:20. Med rec and consent are done.

## 2022-06-30 ENCOUNTER — Ambulatory Visit (INDEPENDENT_AMBULATORY_CARE_PROVIDER_SITE_OTHER): Payer: PPO | Admitting: Family Medicine

## 2022-06-30 ENCOUNTER — Encounter: Payer: Self-pay | Admitting: Family Medicine

## 2022-06-30 VITALS — BP 138/76 | HR 79 | Temp 98.7°F | Ht 72.0 in | Wt 242.4 lb

## 2022-06-30 DIAGNOSIS — K439 Ventral hernia without obstruction or gangrene: Secondary | ICD-10-CM | POA: Diagnosis not present

## 2022-06-30 DIAGNOSIS — E882 Lipomatosis, not elsewhere classified: Secondary | ICD-10-CM | POA: Insufficient documentation

## 2022-06-30 DIAGNOSIS — I440 Atrioventricular block, first degree: Secondary | ICD-10-CM

## 2022-06-30 DIAGNOSIS — I10 Essential (primary) hypertension: Secondary | ICD-10-CM

## 2022-06-30 DIAGNOSIS — Z01818 Encounter for other preprocedural examination: Secondary | ICD-10-CM

## 2022-06-30 NOTE — Progress Notes (Signed)
[facility] [address] [place] [address] Dept: [phone] Dept Fax: [phone]  Preoperative Exam Visit  Subjective:    Patient ID: Jeremy Fox, male    DOB: [birth year], 77 y.o..   MRN: [id number]  Chief Complaint  Patient presents with   Pre-op Exam    Pre- op clearance.    History of Present Illness:  Patient is in today for a preoperative assessment at the request of Dr. Andrey Campanile. Mr. Casaus has a ventral hernia and is looking to undergo an hernia repair.  Mr. Prajapati has a history of hypertension. He is currently managed on Zestoretic 20-25 mg twice daily and minoxidil 2.5 mg daily. Hye has been seen by cardiology in the past. The record indicates a concern for hypertensive heart 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. He denies any shortmess of breath and is not currently having any lower leg edema.  Mr. Lipford has a history of sleep apnea. He currently uses a CPAP and is pleased with how it is helping his sleep.  Mr. Dunavant has a history of BPH. He is managed on finasteride 5 mg daily.  Past Medical History: Patient Active Problem List   Diagnosis Date Noted   First degree heart block 06/30/2022   Ventral hernia without obstruction or gangrene 06/01/2022   Dermal nevus of parietal region of scalp 06/01/2022   Sensorineural hearing loss (SNHL) of both ears 12/11/2021   Swelling of both lower extremities 11/10/2021   Urinary retention 04/05/2021   Benign prostatic hyperplasia 10/14/2020   Class 1 obesity due to excess calories with serious comorbidity and body mass index (BMI) of 31.0 to 31.9 in adult 09/18/2020   Onychomycosis of right great toe 06/07/2019   Prominent metatarsal head, right 06/07/2019   S/P arthroscopy of right shoulder 06/05/2019   Chronic pain of both knees 05/08/2019   Chronic right shoulder pain 05/08/2019   Elevated PSA 09/05/2018   Memory loss 09/05/2018   OSA (obstructive sleep apnea)  03/29/2016   Essential hypertension 06/10/2015   Dyslipidemia 06/03/2015   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/03/2015   Rotator cuff syndrome of left shoulder 06/03/2015   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   Past Surgical History:  Procedure Laterality Date   CATARACT EXTRACTION W/ INTRAOCULAR LENS IMPLANT Bilateral    KNEE SURGERY     KNEE SURGERY Right    REPLACEMENT TOTAL KNEE Bilateral    XI ROBOTIC ASSISTED SIMPLE PROSTATECTOMY N/A 06/26/2021   Procedure: XI ROBOTIC ASSISTED SIMPLE PROSTATECTOMY;  Surgeon: Sebastian Ache, MD;  Location: [facility];  Service: Urology;  Laterality: N/A;   Family History  Problem Relation Age of Onset   Heart attack Father    Heart disease Father    Diabetes Brother    Outpatient Medications Prior to Visit  Medication Sig Dispense Refill   acetaminophen (TYLENOL) 500 MG tablet Take 1,000 mg by mouth every 6 (six) hours as needed for mild pain.     finasteride (PROSCAR) 5 MG tablet Take 5 mg by mouth daily.     lisinopril-hydrochlorothiazide (ZESTORETIC) 20-25 MG tablet Take 1 tablet by mouth in the morning and at bedtime. 180 tablet 3   minoxidil (LONITEN) 2.5 MG tablet Take 1 tablet (2.5 mg total) by mouth daily. 30 tablet 12   No facility-administered medications prior to visit.   Allergies  Allergen Reactions   Sulfa Antibiotics Rash   Sulfamethoxazole Rash     Objective:   Today's Vitals   06/30/22 1254  BP: (!) 142/78  Pulse: 79  Temp: 98.7 F (37.1 C)  TempSrc: Temporal  SpO2: 97%  Weight: 242 lb 6.4 oz (110 kg)  Height: 6' (1.829 m)   Body mass index is 32.88 kg/m.   General: Well developed, well nourished. No acute distress. HEENT: Normocephalic, non-traumatic. PERRL, EOMI. Conjunctiva clear. External ears normal. EAC   and TMs normal bilaterally. Nose clear without congestion or rhinorrhea. Mucous membranes moist.   Oropharynx clear. Good dentition. Neck: Supple. No lymphadenopathy. No  thyromegaly. Lungs: Clear to auscultation bilaterally. No wheezing, rales or rhonchi. CV: RRR without murmurs or rubs. Pulses 2+ bilaterally. Abdomen: Soft, non-tender. Bowel sounds positive, normal pitch and frequency. No   hepatosplenomegaly. No rebound or guarding. There is a large bulge in the mid abdomen near the   umbilicus. This is reducible. Back: Straight. No CVA tenderness bilaterally. Extremities: Full ROM. No joint swelling or tenderness. No edema noted. Skin: Warm and dry. There are multiple subcutaneous soft nodules consistent with lipomas to both   upper extremities. Psych: Alert and oriented. Normal mood and affect.  Health Maintenance Due  Topic Date Due   Medicare Annual Wellness (AWV)  06/02/2016   Lab Results    Latest Ref Rng & Units 12/11/2021   10:46 AM 06/27/2021    4:45 AM 06/26/2021   10:23 AM  CBC  WBC 4.0 - 10.5 K/uL 7.9  11.3    Hemoglobin 13.0 - 17.0 g/dL 16.1  09.6  04.5   Hematocrit 39.0 - 52.0 % 43.7  36.3  42.5   Platelets 150.0 - 400.0 K/uL 205.0  247        Latest Ref Rng & Units 11/23/2021    9:35 AM 11/10/2021    9:47 AM 06/27/2021    4:45 AM  BMP  Glucose 70 - 99 mg/dL 91  409  811   BUN 8 - 27 mg/dL 15  33  13   Creatinine 0.76 - 1.27 mg/dL 9.14  7.82  9.56   BUN/Creat Ratio 10 - 24 13  20     Sodium 134 - 144 mmol/L 141  142  134   Potassium 3.5 - 5.2 mmol/L 4.2  4.0  3.7   Chloride 96 - 106 mmol/L 102  105  102   CO2 20 - 29 mmol/L 22  21  25    Calcium 8.6 - 10.2 mg/dL 8.9  8.9  8.0    EKG: Sinus rhythm with a 1st degree heart block. There are some nonspecific ST changes in lead I and V5-V6.     Assessment & Plan:   Problem List Items Addressed This Visit       Cardiovascular and Mediastinum   Essential hypertension    Blood pressure is adequately controlled. Continue  Zestoretic 20-25 mg daily and minoxidil 2.5 mg daily.      First degree heart block    Compared to prior EKGs, the 1st degree block is new, though his last EKG  from Sept. was borderline for this. He will be seeking cardiology clearance for his surgery.        Other   Ventral hernia without obstruction or gangrene    Significant hernia. Operative repair is indicated.      Preoperative examination - Primary    Overall good health. Blood pressure has been in improved control. Mr. Szymczak has an appointment with cardiology for cardiac clearance for his surgery. I will clear him medically.      Relevant Orders  EKG 12-Lead (Completed)    Return if symptoms worsen or fail to improve.   Loyola Mast, MD

## 2022-06-30 NOTE — Assessment & Plan Note (Signed)
Overall good health. Blood pressure has been in improved control. Mr. Jeremy Fox has an appointment with cardiology for cardiac clearance for his surgery. I will clear him medically.

## 2022-06-30 NOTE — Assessment & Plan Note (Signed)
Significant hernia. Operative repair is indicated.

## 2022-06-30 NOTE — Assessment & Plan Note (Signed)
Compared to prior EKGs, the 1st degree block is new, though his last EKG from Sept. was borderline for this. He will be seeking cardiology clearance for his surgery.

## 2022-06-30 NOTE — Assessment & Plan Note (Signed)
Blood pressure is adequately controlled. Continue  Zestoretic 20-25 mg daily and minoxidil 2.5 mg daily. 

## 2022-07-02 ENCOUNTER — Ambulatory Visit: Payer: PPO | Attending: Internal Medicine | Admitting: Nurse Practitioner

## 2022-07-02 DIAGNOSIS — Z01810 Encounter for preprocedural cardiovascular examination: Secondary | ICD-10-CM

## 2022-07-02 NOTE — Progress Notes (Signed)
Virtual Visit via Telephone Note   Because of Jeremy Fox's co-morbid illnesses, he is at least at moderate risk for complications without adequate follow up.  This format is felt to be most appropriate for this patient at this time.  The patient did not have access to video technology/had technical difficulties with video requiring transitioning to audio format only (telephone).  All issues noted in this document were discussed and addressed.  No physical exam could be performed with this format.  Please refer to the patient's chart for his consent to telehealth for Jeremy Fox.  Evaluation Performed:  Preoperative cardiovascular risk assessment _____________   Date:  07/02/2022   Patient ID:  Jeremy Fox, DOB 11/19/1945, MRN [id number] Patient Location:  Home Provider location:   Office  Primary Care Provider:  Loyola Mast, MD Primary Cardiologist:  Jeremy Balsam, MD  Chief Complaint / Patient Profile   77 y.o. y/o male with a h/o bilateral lower extremity edema, hypertension, dyslipidemia, and OSA who is pending hernia repair with Jeremy Fox of [facility] and presents today for telephonic preoperative cardiovascular risk assessment.  History of Present Illness    Jeremy Fox is a 77 y.o. male who presents via audio/video conferencing for a telehealth visit today.  Pt was last seen in cardiology clinic on 11/10/2021 by Dr. Bing Fox.  At that time Jeremy Fox was doing well.  The patient is now pending procedure as outlined above. Since his last visit, he has done well from a cardiac standpoint.   He denies chest pain, palpitations, dyspnea, pnd, orthopnea, n, v, dizziness, syncope, edema, weight gain, or early satiety. All other systems reviewed and are otherwise negative except as noted above.   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 Authorizing Provider  acetaminophen (TYLENOL) 500 MG tablet Take 1,000 mg by mouth every 6 (six) hours as needed for mild pain.    [provider]  finasteride (PROSCAR) 5 MG tablet Take 5 mg by mouth daily. 04/13/21   [provider]  lisinopril-hydrochlorothiazide (ZESTORETIC) 20-25 MG tablet Take 1 tablet by mouth in the morning and at bedtime. 01/08/22   Jeremy Mast, MD  minoxidil (LONITEN) 2.5 MG tablet Take 1 tablet (2.5 mg total) by mouth daily. 03/02/22   Jeremy Mast, MD    Physical  Exam    Vital Signs:  Jeremy Fox does not have vital signs available for review today.  Given telephonic nature of communication, physical exam is limited. AAOx3. NAD. Normal affect.  Speech and respirations are unlabored.  Accessory Clinical Findings    None  Assessment & Plan    1.  Preoperative Cardiovascular Risk Assessment:  According to the Revised Cardiac Risk Index (RCRI), his Perioperative Risk of Major Cardiac  Event is (%): 0.4. His Functional Capacity in METs is: 7.34 according to the [name] Activity Status Index (DASI). Therefore, based on ACC/AHA guidelines, patient would be at acceptable risk for the planned procedure without further cardiovascular testing.   The patient was advised that if he develops new symptoms prior to surgery to contact our office to arrange for a follow-up visit, and he verbalized understanding.  A copy of this note will be routed to requesting surgeon.  Time:   Today, I have spent 5 minutes with the patient with telehealth technology discussing medical history, symptoms, and management plan.     Jeremy Grapes, NP  07/02/2022, 9:35 AM

## 2022-07-11 NOTE — Progress Notes (Addendum)
COVID Vaccine received:  [x]  No []  Yes Date of any COVID positive Test in last 90 days:  none  PCP - Herbie Drape, MD  clearance in 06-30-2022 note Cardiologist - Gypsy Balsam, MD ,  Bernadene Person, NP  Cardiac Clearance Phone Note 07-02-2022  Chest x-ray -  EKG -  06-30-2022 Epic Stress Test - Lexiscan 10-22-2020  Epic ECHO -  Cardiac Cath -   PCR screen: []  Ordered & Completed           []   No Order but Needs PROFEND           [x]   N/A for this surgery  Surgery Plan:  []  Ambulatory                            [x]  Outpatient in bed                            []  Admit  Anesthesia:    [x]  General  []  Spinal                           []   Choice []   MAC  Bowel Prep - [x]  No  []   Yes ______  Pacemaker / ICD device [x]  No []  Yes   Spinal Cord Stimulator:[x]  No []  Yes       History of Sleep Apnea? []  No [x]  Yes   CPAP used?- []  No [x]  Yes    Does the patient monitor blood sugar?          []  No []  Yes  [x]  N/A  Patient has: [x]  NO Hx DM   []  Pre-DM                 []  DM1  []   DM2  Blood Thinner / Instructions: none Aspirin Instructions:  ASA 81 mg  hold the rest of the week  ERAS Protocol Ordered: []  No  [x]  Yes  listed in Special Needs section of [facility] PRE-SURGERY []  ENSURE  []  G2  [x]  No Drink Ordered Patient is to be NPO after: 07:30 am  Comments: No Orders as of 07-13-2022, called Steward Drone and she is sending a msg.   Activity level: Patient is able to climb a flight of stairs without difficulty; [x]  No CP  [x]  No SOB.   Patient can perform ADLs without assistance.   Anesthesia review: OSA-CPAP, HTN, Ventricular Bigeminy, 1 AVB, HOH, s/p Prostatectomy 06-25-21  Patient denies shortness of breath, fever, cough and chest pain at PAT appointment.  Patient verbalized understanding and agreement to the Pre-Surgical Instructions that were given to them at this PAT appointment. Patient was also educated of the need to review these PAT instructions again prior to his surgery.I  reviewed the appropriate phone numbers to call if they have any and questions or concerns.

## 2022-07-11 NOTE — Patient Instructions (Signed)
SURGICAL WAITING ROOM VISITATION Patients having surgery or a procedure may have no more than 2 support people in the waiting area - these visitors may rotate in the visitor waiting room.   Due to an increase in RSV and influenza rates and associated hospitalizations, children ages 62 and under may not visit patients in [facility] hospitals. If the patient needs to stay at the hospital during part of their recovery, the visitor guidelines for inpatient rooms apply.  PRE-OP VISITATION  Pre-op nurse will coordinate an appropriate time for 1 support person to accompany the patient in pre-op.  This support person may not rotate.  This visitor will be contacted when the time is appropriate for the visitor to come back in the pre-op area.  Please refer to the [facility] website for the visitor guidelines for Inpatients (after your surgery is over and you are in a regular room).  You are not required to quarantine at this time prior to your surgery. However, you must do this: Hand Hygiene often Do NOT share personal items Notify your provider if you are in close contact with someone who has COVID or you develop fever 100.4 or greater, new onset of sneezing, cough, sore throat, shortness of breath or body aches.  If you test positive for Covid or have been in contact with anyone that has tested positive in the last 10 days please notify you surgeon.    Your procedure is scheduled on:  Friday  Jul 16, 2022  Report to [facility] Main Entrance: Leota Jacobsen entrance where the [employer] is available.   Report to admitting at: 08:15  AM  +++++Call this number if you have any questions or problems the morning of surgery [phone]  Do not eat food after Midnight the night prior to your surgery/procedure.  After Midnight you may have the following liquids until   07:30 AM  DAY OF SURGERY  Clear Liquid Diet Water Black Coffee (sugar ok, NO MILK/CREAM OR CREAMERS)  Tea (sugar ok, NO  MILK/CREAM OR CREAMERS) regular and decaf                             Plain Jell-O  with no fruit (NO RED)                                           Fruit ices (not with fruit pulp, NO RED)                                     Popsicles (NO RED)                                                                  Juice: apple, WHITE grape, WHITE cranberry Sports drinks like Gatorade or Powerade (NO RED)               FOLLOW BOWEL PREP AND ANY ADDITIONAL PRE OP INSTRUCTIONS YOU RECEIVED FROM YOUR SURGEON'S OFFICE!!!   Oral Hygiene is also important to reduce your risk of infection.  Remember - BRUSH YOUR TEETH THE MORNING OF SURGERY WITH YOUR REGULAR TOOTHPASTE  Do NOT smoke after Midnight the night before surgery.  Take ONLY these medicines the morning of surgery with A SIP OF WATER: Finastride (Proscar), minoxidil (Loniten). You may take Tylenol for pain if needed.   If You have been diagnosed with Sleep Apnea - Bring CPAP mask and tubing day of surgery. We will provide you with a CPAP machine on the day of your surgery.                   You may not have any metal on your body including  jewelry, and body piercing  Do not wear  lotions, powders, cologne, or deodorant  Men may shave face and neck.  You may bring a small overnight bag with you on the day of surgery, only pack items that are not valuable. [facility] IS NOT RESPONSIBLE   FOR VALUABLES THAT ARE LOST OR STOLEN.   Do not bring your home medications to the hospital. The Pharmacy will dispense medications listed on your medication list to you during your admission in the Hospital.  Special Instructions: Bring a copy of your healthcare power of attorney and living will documents the day of surgery, if you wish to have them scanned into your [facility] Records- EPIC  Please read over the following fact sheets you were given: IF YOU HAVE QUESTIONS ABOUT YOUR PRE-OP INSTRUCTIONS, PLEASE CALL [phone].   [facility] - Preparing for Surgery Before surgery, you can play an important role.  Because skin is not sterile, your skin needs to be as free of germs as possible.  You can reduce the number of germs on your skin by washing with CHG (chlorahexidine gluconate) soap before surgery.  CHG is an antiseptic cleaner which kills germs and bonds with the skin to continue killing germs even after washing. Please DO NOT use if you have an allergy to CHG or antibacterial soaps.  If your skin becomes reddened/irritated stop using the CHG and inform your nurse when you arrive at Short Stay. Do not shave (including legs and underarms) for at least 48 hours prior to the first CHG shower.  You may shave your face/neck.  Please follow these instructions carefully:  1.  Shower with CHG Soap the night before surgery and the  morning of surgery.  2.  If you choose to wash your hair, wash your hair first as usual with your normal  shampoo.  3.  After you shampoo, rinse your hair and body thoroughly to remove the shampoo.                             4.  Use CHG as you would any other liquid soap.  You can apply chg directly to the skin and wash.  Gently with a scrungie or clean washcloth.  5.  Apply the CHG Soap to your body ONLY FROM THE NECK DOWN.   Do not use on face/ open                           Wound or open sores. Avoid contact with eyes, ears mouth and genitals (private parts).                       Wash face,  Genitals (private parts) with your normal soap.  6.  Wash thoroughly, paying special attention to the area where your  surgery  will be performed.  7.  Thoroughly rinse your body with warm water from the neck down.  8.  DO NOT shower/wash with your normal soap after using and rinsing off the CHG Soap.            9.  Pat yourself dry with a clean towel.            10.  Wear clean pajamas.            11.  Place clean sheets on your bed the night of your first shower and do not  sleep with  pets.  ON THE DAY OF SURGERY : Do not apply any lotions/deodorants the morning of surgery.  Please wear clean clothes to the hospital/surgery center.    FAILURE TO FOLLOW THESE INSTRUCTIONS MAY RESULT IN THE CANCELLATION OF YOUR SURGERY  PATIENT SIGNATURE_________________________________  NURSE SIGNATURE__________________________________  ________________________________________________________________________

## 2022-07-13 ENCOUNTER — Encounter (HOSPITAL_COMMUNITY)
Admission: RE | Admit: 2022-07-13 | Discharge: 2022-07-13 | Disposition: A | Discharge status: Home / Self Care | Payer: PPO | Source: Ambulatory Visit | Attending: General Surgery | Admitting: General Surgery

## 2022-07-13 ENCOUNTER — Ambulatory Visit: Payer: Self-pay | Admitting: General Surgery

## 2022-07-13 ENCOUNTER — Encounter (HOSPITAL_COMMUNITY): Payer: Self-pay

## 2022-07-13 ENCOUNTER — Other Ambulatory Visit: Payer: Self-pay

## 2022-07-13 VITALS — BP 167/95 | Temp 98.1°F | Resp 18 | Ht 72.0 in | Wt 239.0 lb

## 2022-07-13 DIAGNOSIS — I10 Essential (primary) hypertension: Secondary | ICD-10-CM | POA: Diagnosis not present

## 2022-07-13 DIAGNOSIS — Z01812 Encounter for preprocedural laboratory examination: Secondary | ICD-10-CM | POA: Insufficient documentation

## 2022-07-13 DIAGNOSIS — K432 Incisional hernia without obstruction or gangrene: Secondary | ICD-10-CM | POA: Diagnosis not present

## 2022-07-13 LAB — CBC
HCT: 42.3 % (ref 39.0–52.0)
Hemoglobin: 14.6 g/dL (ref 13.0–17.0)
MCH: 28.8 pg (ref 26.0–34.0)
MCHC: 34.5 g/dL (ref 30.0–36.0)
MCV: 83.4 fL (ref 80.0–100.0)
Platelets: 239 10*3/uL (ref 150–400)
RBC: 5.07 MIL/uL (ref 4.22–5.81)
RDW: 12.6 % (ref 11.5–15.5)
WBC: 9 10*3/uL (ref 4.0–10.5)
nRBC: 0 % (ref 0.0–0.2)

## 2022-07-13 LAB — BASIC METABOLIC PANEL
Anion gap: 9 (ref 5–15)
BUN: 20 mg/dL (ref 8–23)
CO2: 29 mmol/L (ref 22–32)
Calcium: 8.6 mg/dL — ABNORMAL LOW (ref 8.9–10.3)
Chloride: 97 mmol/L — ABNORMAL LOW (ref 98–111)
Creatinine, Ser: 0.98 mg/dL (ref 0.61–1.24)
GFR, Estimated: >60 mL/min (ref >60)
Glucose, Bld: 93 mg/dL (ref 70–99)
Potassium: 3.3 mmol/L — ABNORMAL LOW (ref 3.5–5.1)
Sodium: 135 mmol/L (ref 135–145)

## 2022-07-14 NOTE — Progress Notes (Signed)
Anesthesia chart review   Case: [id number] Date/Time: 07/16/22 1015   Procedure: LAPAROSCOPIC INCISIONAL HERNIA REPAIR WITH MESH   Anesthesia type: General   Pre-op diagnosis: INCISIONAL HERNIA   Location[facility] ROOM 02 / [facility]   Surgeons: Gaynelle Adu, MD       DISCUSSION: 77 year old never smoker with history of OSA, HTN, BPH, incisional hernia scheduled for above procedure 07/16/2022 with Dr. Gaynelle Adu.  Per cardiology preoperative evaluation 07/02/2022, "According to the Revised Cardiac Risk Index (RCRI), his Perioperative Risk of Major Cardiac Event is (%): 0.4. His Functional Capacity in METs is: 7.34 according to the [name] Activity Status Index (DASI). Therefore, based on ACC/AHA guidelines, patient would be at acceptable risk for the planned procedure without further cardiovascular testing."  Patient last seen by PCP 06/30/2022.  Per office visit note, "Overall good health. Blood pressure has been in improved control. Mr. Franchina has an appointment with cardiology for cardiac clearance for his surgery. I will clear him medically."  Anticipate pt can proceed with planned procedure barring acute status change.   VS: BP (!) 167/95 Comment: Right arm sitting  Temp 36.7 C (Oral)   Resp 18   Ht 6' (1.829 m)   Wt 108.4 kg   SpO2 98%   BMI 32.41 kg/m   PROVIDERS: Loyola Mast, MD is PCP  Cardiologist - Gypsy Balsam, MD  LABS: Labs reviewed: Acceptable for surgery. (all labs ordered are listed, but only abnormal results are displayed)  Labs Reviewed  BASIC METABOLIC PANEL - Abnormal; Notable for the following components:      Result Value   Potassium 3.3 (*)    Chloride 97 (*)    Calcium 8.6 (*)    All other components within normal limits  CBC     IMAGES:   EKG:   CV: Myocardial perfusion 10/22/2020 Nuclear stress EF: 66%. The left ventricular ejection fraction is hyperdynamic (>65%). There was no ST segment deviation noted during stress. No T wave inversion  was noted during stress. The study is normal. This is a low risk study. 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    Past Surgical History:  Procedure Laterality Date   CATARACT EXTRACTION W/ INTRAOCULAR LENS IMPLANT Bilateral    REPLACEMENT TOTAL KNEE Bilateral    XI ROBOTIC ASSISTED SIMPLE PROSTATECTOMY N/A 06/26/2021   Procedure: XI ROBOTIC ASSISTED SIMPLE PROSTATECTOMY;  Surgeon: Sebastian Ache, MD;  Location[facility];  Service: Urology;  Laterality: N/A;    MEDICATIONS:  acetaminophen (TYLENOL) 500 MG tablet   aspirin EC 81 MG tablet   finasteride (PROSCAR) 5 MG tablet   lisinopril-hydrochlorothiazide (ZESTORETIC) 20-25 MG tablet   minoxidil (LONITEN) 2.5 MG tablet   No current facility-administered medications for this encounter.   Jodell Cipro Ward, PA-C [facility] Pre-Surgical Testing [phone]

## 2022-07-14 NOTE — Anesthesia Preprocedure Evaluation (Addendum)
Anesthesia Evaluation  Patient identified by MRN, date of birth, ID band Patient awake    Reviewed: Allergy & Precautions, H&P , NPO status , Patient's Chart, lab work & pertinent test results  Airway Mallampati: IV  TM Distance: >3 FB Neck ROM: Full    Dental  (+) Teeth Intact, Dental Advisory Given   Pulmonary sleep apnea and Continuous Positive Airway Pressure Ventilation    Pulmonary exam normal breath sounds clear to auscultation       Cardiovascular hypertension (167/95 preop, per pt normally lower), Pt. on medications Normal cardiovascular exam Rhythm:Regular Rate:Normal     Neuro/Psych negative neurological ROS  negative psych ROS   GI/Hepatic negative GI ROS, Neg liver ROS,,,  Endo/Other  negative endocrine ROS    Renal/GU negative Renal ROS  negative genitourinary   Musculoskeletal  (+) Arthritis , Osteoarthritis,    Abdominal  (+) + obese  Peds negative pediatric ROS (+)  Hematology negative hematology ROS (+)   Anesthesia Other Findings   Reproductive/Obstetrics negative OB ROS                              Anesthesia Physical Anesthesia Plan  ASA: 3  Anesthesia Plan: General   Post-op Pain Management: Tylenol PO (pre-op)*   Induction: Intravenous  PONV Risk Score and Plan: 2 and Ondansetron, Dexamethasone, Midazolam and Treatment may vary due to age or medical condition  Airway Management Planned: Oral ETT  Additional Equipment:   Intra-op Plan:   Post-operative Plan: Extubation in OR  Informed Consent: I have reviewed the patients History and Physical, chart, labs and discussed the procedure including the risks, benefits and alternatives for the proposed anesthesia with the patient or authorized representative who has indicated his/her understanding and acceptance.     Dental advisory given  Plan Discussed with: CRNA  Anesthesia Plan Comments: (Last  airway Laryngoscope Size: Mac and 4 Grade View: Grade II Tube type: Oral Tube size: 7.5 mm Number of attempts: 1 )        Anesthesia Quick Evaluation

## 2022-07-16 ENCOUNTER — Inpatient Hospital Stay (HOSPITAL_COMMUNITY)
Admission: RE | Admit: 2022-07-16 | Discharge: 2022-07-18 | DRG: 354 | Disposition: A | Discharge status: Home / Self Care | Payer: PPO | Source: Ambulatory Visit | Attending: General Surgery | Admitting: General Surgery

## 2022-07-16 ENCOUNTER — Other Ambulatory Visit: Payer: Self-pay

## 2022-07-16 ENCOUNTER — Encounter (HOSPITAL_COMMUNITY): Payer: Self-pay | Admitting: General Surgery

## 2022-07-16 ENCOUNTER — Ambulatory Visit (HOSPITAL_COMMUNITY): Payer: PPO | Admitting: Anesthesiology

## 2022-07-16 ENCOUNTER — Encounter (HOSPITAL_COMMUNITY)
Admission: RE | Disposition: A | Discharge status: Home / Self Care | Payer: Self-pay | Source: Ambulatory Visit | Attending: General Surgery

## 2022-07-16 ENCOUNTER — Ambulatory Visit (HOSPITAL_COMMUNITY): Payer: PPO | Admitting: Physician Assistant

## 2022-07-16 DIAGNOSIS — Z833 Family history of diabetes mellitus: Secondary | ICD-10-CM

## 2022-07-16 DIAGNOSIS — I10 Essential (primary) hypertension: Principal | ICD-10-CM

## 2022-07-16 DIAGNOSIS — Z9079 Acquired absence of other genital organ(s): Secondary | ICD-10-CM

## 2022-07-16 DIAGNOSIS — E785 Hyperlipidemia, unspecified: Secondary | ICD-10-CM | POA: Diagnosis present

## 2022-07-16 DIAGNOSIS — K432 Incisional hernia without obstruction or gangrene: Secondary | ICD-10-CM

## 2022-07-16 DIAGNOSIS — Z96653 Presence of artificial knee joint, bilateral: Secondary | ICD-10-CM | POA: Diagnosis present

## 2022-07-16 DIAGNOSIS — E876 Hypokalemia: Secondary | ICD-10-CM | POA: Diagnosis not present

## 2022-07-16 DIAGNOSIS — Z9989 Dependence on other enabling machines and devices: Secondary | ICD-10-CM

## 2022-07-16 DIAGNOSIS — G4733 Obstructive sleep apnea (adult) (pediatric): Secondary | ICD-10-CM | POA: Diagnosis present

## 2022-07-16 DIAGNOSIS — Z6832 Body mass index (BMI) 32.0-32.9, adult: Secondary | ICD-10-CM

## 2022-07-16 DIAGNOSIS — E669 Obesity, unspecified: Secondary | ICD-10-CM | POA: Diagnosis present

## 2022-07-16 DIAGNOSIS — Z8249 Family history of ischemic heart disease and other diseases of the circulatory system: Secondary | ICD-10-CM

## 2022-07-16 DIAGNOSIS — Z79899 Other long term (current) drug therapy: Secondary | ICD-10-CM

## 2022-07-16 DIAGNOSIS — N400 Enlarged prostate without lower urinary tract symptoms: Secondary | ICD-10-CM | POA: Diagnosis present

## 2022-07-16 DIAGNOSIS — Z882 Allergy status to sulfonamides status: Secondary | ICD-10-CM

## 2022-07-16 DIAGNOSIS — I119 Hypertensive heart 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: Secondary | ICD-10-CM | POA: Diagnosis present

## 2022-07-16 DIAGNOSIS — E871 Hypo-osmolality and hyponatremia: Secondary | ICD-10-CM | POA: Diagnosis not present

## 2022-07-16 DIAGNOSIS — Z8719 Personal history of other diseases of the digestive system: Secondary | ICD-10-CM

## 2022-07-16 HISTORY — PX: INCISIONAL HERNIA REPAIR: SHX193

## 2022-07-16 SURGERY — REPAIR, HERNIA, INCISIONAL, LAPAROSCOPIC
Anesthesia: General

## 2022-07-16 MED ORDER — DEXAMETHASONE SODIUM PHOSPHATE 10 MG/ML IJ SOLN
INTRAMUSCULAR | Status: AC
  Filled 2022-07-16: qty 1

## 2022-07-16 MED ORDER — ONDANSETRON HCL 4 MG/2ML IJ SOLN
INTRAMUSCULAR | Status: DC | PRN
  Administered 2022-07-16: 4 mg via INTRAVENOUS

## 2022-07-16 MED ORDER — ORAL CARE MOUTH RINSE: 15.0000 mL | Freq: Once | OROMUCOSAL | Status: AC

## 2022-07-16 MED ORDER — LIDOCAINE HCL (CARDIAC) PF 100 MG/5ML IV SOSY
PREFILLED_SYRINGE | INTRAVENOUS | Status: DC | PRN
  Administered 2022-07-16: 60 mg via INTRAVENOUS

## 2022-07-16 MED ORDER — ENOXAPARIN SODIUM 40 MG/0.4ML IJ SOSY
40.0000 mg | PREFILLED_SYRINGE | INTRAMUSCULAR | Status: DC
  Administered 2022-07-17: 40 mg via SUBCUTANEOUS
  Filled 2022-07-16 (×2): qty 0.4

## 2022-07-16 MED ORDER — ONDANSETRON 4 MG PO TBDP: 4.0000 mg | ORAL_TABLET | Freq: Four times a day (QID) | ORAL | Status: DC | PRN

## 2022-07-16 MED ORDER — KCL IN DEXTROSE-NACL 20-5-0.45 MEQ/L-%-% IV SOLN
INTRAVENOUS | Status: DC
  Filled 2022-07-16: qty 1000

## 2022-07-16 MED ORDER — ONDANSETRON HCL 4 MG/2ML IJ SOLN
INTRAMUSCULAR | Status: AC
  Filled 2022-07-16: qty 2

## 2022-07-16 MED ORDER — SIMETHICONE 80 MG PO CHEW: 40.0000 mg | CHEWABLE_TABLET | Freq: Four times a day (QID) | ORAL | Status: DC | PRN

## 2022-07-16 MED ORDER — MINOXIDIL 2.5 MG PO TABS
2.5000 mg | ORAL_TABLET | Freq: Every day | ORAL | Status: DC
  Administered 2022-07-17 – 2022-07-18 (×2): 2.5 mg via ORAL
  Filled 2022-07-16 (×2): qty 1

## 2022-07-16 MED ORDER — ONDANSETRON HCL 4 MG/2ML IJ SOLN: 4.0000 mg | Freq: Once | INTRAMUSCULAR | Status: DC | PRN

## 2022-07-16 MED ORDER — SUGAMMADEX SODIUM 200 MG/2ML IV SOLN
INTRAVENOUS | Status: DC | PRN
  Administered 2022-07-16: 200 mg via INTRAVENOUS

## 2022-07-16 MED ORDER — PHENYLEPHRINE HCL (PRESSORS) 10 MG/ML IV SOLN
INTRAVENOUS | Status: DC | PRN
  Administered 2022-07-16: 80 ug via INTRAVENOUS

## 2022-07-16 MED ORDER — FINASTERIDE 5 MG PO TABS
5.0000 mg | ORAL_TABLET | Freq: Every day | ORAL | Status: DC
  Administered 2022-07-17 – 2022-07-18 (×2): 5 mg via ORAL
  Filled 2022-07-16 (×2): qty 1

## 2022-07-16 MED ORDER — ONDANSETRON HCL 4 MG/2ML IJ SOLN: 4.0000 mg | Freq: Four times a day (QID) | INTRAMUSCULAR | Status: DC | PRN

## 2022-07-16 MED ORDER — LACTATED RINGERS IV SOLN: INTRAVENOUS | Status: DC

## 2022-07-16 MED ORDER — 0.9 % SODIUM CHLORIDE (POUR BTL) OPTIME
TOPICAL | Status: DC | PRN
  Administered 2022-07-16: 1000 mL

## 2022-07-16 MED ORDER — ACETAMINOPHEN 500 MG PO TABS
1000.0000 mg | ORAL_TABLET | ORAL | Status: AC
  Administered 2022-07-16: 1000 mg via ORAL
  Filled 2022-07-16: qty 2

## 2022-07-16 MED ORDER — CEFAZOLIN SODIUM-DEXTROSE 2-4 GM/100ML-% IV SOLN
2.0000 g | INTRAVENOUS | Status: AC
  Administered 2022-07-16: 2 g via INTRAVENOUS
  Filled 2022-07-16: qty 100

## 2022-07-16 MED ORDER — MIDAZOLAM HCL 2 MG/2ML IJ SOLN
INTRAMUSCULAR | Status: AC
  Filled 2022-07-16: qty 2

## 2022-07-16 MED ORDER — OXYCODONE HCL 5 MG PO TABS: 5.0000 mg | ORAL_TABLET | Freq: Once | ORAL | Status: DC | PRN

## 2022-07-16 MED ORDER — FENTANYL CITRATE (PF) 100 MCG/2ML IJ SOLN
INTRAMUSCULAR | Status: DC | PRN
  Administered 2022-07-16 (×2): 25 ug via INTRAVENOUS
  Administered 2022-07-16: 50 ug via INTRAVENOUS
  Administered 2022-07-16: 25 ug via INTRAVENOUS
  Administered 2022-07-16 (×2): 50 ug via INTRAVENOUS
  Administered 2022-07-16: 25 ug via INTRAVENOUS

## 2022-07-16 MED ORDER — CHLORHEXIDINE GLUCONATE CLOTH 2 % EX PADS: 6.0000 | MEDICATED_PAD | Freq: Once | CUTANEOUS | Status: DC

## 2022-07-16 MED ORDER — DOCUSATE SODIUM 100 MG PO CAPS
100.0000 mg | ORAL_CAPSULE | Freq: Two times a day (BID) | ORAL | Status: DC
  Administered 2022-07-16 – 2022-07-17 (×2): 100 mg via ORAL
  Filled 2022-07-16 (×4): qty 1

## 2022-07-16 MED ORDER — PROPOFOL 10 MG/ML IV BOLUS
INTRAVENOUS | Status: AC
  Filled 2022-07-16: qty 20

## 2022-07-16 MED ORDER — DEXAMETHASONE SODIUM PHOSPHATE 10 MG/ML IJ SOLN
INTRAMUSCULAR | Status: DC | PRN
  Administered 2022-07-16: 8 mg via INTRAVENOUS

## 2022-07-16 MED ORDER — LISINOPRIL 20 MG PO TABS
20.0000 mg | ORAL_TABLET | Freq: Once | ORAL | Status: AC
  Administered 2022-07-16: 20 mg via ORAL
  Filled 2022-07-16: qty 1

## 2022-07-16 MED ORDER — BUPIVACAINE-EPINEPHRINE 0.25% -1:200000 IJ SOLN
INTRAMUSCULAR | Status: DC | PRN
  Administered 2022-07-16: 50 mL

## 2022-07-16 MED ORDER — EPHEDRINE SULFATE (PRESSORS) 50 MG/ML IJ SOLN
INTRAMUSCULAR | Status: DC | PRN
  Administered 2022-07-16 (×2): 5 mg via INTRAVENOUS

## 2022-07-16 MED ORDER — HYDROCHLOROTHIAZIDE 25 MG PO TABS
25.0000 mg | ORAL_TABLET | Freq: Every day | ORAL | Status: DC
  Administered 2022-07-17: 25 mg via ORAL
  Filled 2022-07-16: qty 1

## 2022-07-16 MED ORDER — CHLORHEXIDINE GLUCONATE 0.12 % MT SOLN
15.0000 mL | Freq: Once | OROMUCOSAL | Status: AC
  Administered 2022-07-16: 15 mL via OROMUCOSAL

## 2022-07-16 MED ORDER — ACETAMINOPHEN 500 MG PO TABS: 1000.0000 mg | ORAL_TABLET | Freq: Once | ORAL | Status: DC

## 2022-07-16 MED ORDER — METHOCARBAMOL 500 MG PO TABS
500.0000 mg | ORAL_TABLET | Freq: Four times a day (QID) | ORAL | Status: DC | PRN
  Administered 2022-07-17 – 2022-07-18 (×2): 500 mg via ORAL
  Filled 2022-07-16 (×2): qty 1

## 2022-07-16 MED ORDER — PANTOPRAZOLE SODIUM 40 MG IV SOLR
40.0000 mg | Freq: Every day | INTRAVENOUS | Status: DC
  Administered 2022-07-16: 40 mg via INTRAVENOUS
  Filled 2022-07-16: qty 10

## 2022-07-16 MED ORDER — DIPHENHYDRAMINE HCL 12.5 MG/5ML PO ELIX: 12.5000 mg | ORAL_SOLUTION | Freq: Four times a day (QID) | ORAL | Status: DC | PRN

## 2022-07-16 MED ORDER — ACETAMINOPHEN 500 MG PO TABS
1000.0000 mg | ORAL_TABLET | Freq: Four times a day (QID) | ORAL | Status: DC
  Administered 2022-07-16 – 2022-07-18 (×4): 1000 mg via ORAL
  Filled 2022-07-16 (×4): qty 2

## 2022-07-16 MED ORDER — PROPOFOL 10 MG/ML IV BOLUS
INTRAVENOUS | Status: DC | PRN
  Administered 2022-07-16: 150 mg via INTRAVENOUS

## 2022-07-16 MED ORDER — AMISULPRIDE (ANTIEMETIC) 5 MG/2ML IV SOLN: 10.0000 mg | Freq: Once | INTRAVENOUS | Status: DC | PRN

## 2022-07-16 MED ORDER — HYDROCHLOROTHIAZIDE 25 MG PO TABS
25.0000 mg | ORAL_TABLET | Freq: Once | ORAL | Status: AC
  Administered 2022-07-16: 25 mg via ORAL
  Filled 2022-07-16: qty 1

## 2022-07-16 MED ORDER — OXYCODONE HCL 5 MG/5ML PO SOLN: 5.0000 mg | Freq: Once | ORAL | Status: DC | PRN

## 2022-07-16 MED ORDER — LISINOPRIL-HYDROCHLOROTHIAZIDE 20-25 MG PO TABS: 1.0000 | ORAL_TABLET | Freq: Every day | ORAL | Status: DC

## 2022-07-16 MED ORDER — FENTANYL CITRATE (PF) 250 MCG/5ML IJ SOLN
INTRAMUSCULAR | Status: AC
  Filled 2022-07-16: qty 5

## 2022-07-16 MED ORDER — EPHEDRINE 5 MG/ML INJ
INTRAVENOUS | Status: AC
  Filled 2022-07-16: qty 5

## 2022-07-16 MED ORDER — MORPHINE SULFATE (PF) 2 MG/ML IV SOLN: 1.0000 mg | INTRAVENOUS | Status: DC | PRN

## 2022-07-16 MED ORDER — BUPIVACAINE LIPOSOME 1.3 % IJ SUSP: 20.0000 mL | Freq: Once | INTRAMUSCULAR | Status: DC

## 2022-07-16 MED ORDER — LIDOCAINE HCL (PF) 2 % IJ SOLN
INTRAMUSCULAR | Status: AC
  Filled 2022-07-16: qty 5

## 2022-07-16 MED ORDER — BUPIVACAINE-EPINEPHRINE 0.25% -1:200000 IJ SOLN
INTRAMUSCULAR | Status: AC
  Filled 2022-07-16: qty 1

## 2022-07-16 MED ORDER — HYDROMORPHONE HCL 1 MG/ML IJ SOLN
0.2500 mg | INTRAMUSCULAR | Status: DC | PRN
  Administered 2022-07-16: 0.5 mg via INTRAVENOUS

## 2022-07-16 MED ORDER — LACTATED RINGERS IV SOLN: INTRAVENOUS | Status: DC | PRN

## 2022-07-16 MED ORDER — MIDAZOLAM HCL 5 MG/5ML IJ SOLN
INTRAMUSCULAR | Status: DC | PRN
  Administered 2022-07-16: 1 mg via INTRAVENOUS

## 2022-07-16 MED ORDER — LISINOPRIL 20 MG PO TABS
20.0000 mg | ORAL_TABLET | Freq: Every day | ORAL | Status: DC
  Administered 2022-07-17: 20 mg via ORAL
  Filled 2022-07-16: qty 1

## 2022-07-16 MED ORDER — DIPHENHYDRAMINE HCL 50 MG/ML IJ SOLN: 12.5000 mg | Freq: Four times a day (QID) | INTRAMUSCULAR | Status: DC | PRN

## 2022-07-16 MED ORDER — HYDROMORPHONE HCL 1 MG/ML IJ SOLN
INTRAMUSCULAR | Status: AC
  Administered 2022-07-16: 0.5 mg via INTRAVENOUS
  Filled 2022-07-16: qty 1

## 2022-07-16 MED ORDER — MELATONIN 3 MG PO TABS: 3.0000 mg | ORAL_TABLET | Freq: Every evening | ORAL | Status: DC | PRN

## 2022-07-16 MED ORDER — ROCURONIUM BROMIDE 100 MG/10ML IV SOLN
INTRAVENOUS | Status: DC | PRN
  Administered 2022-07-16: 80 mg via INTRAVENOUS

## 2022-07-16 MED ORDER — OXYCODONE HCL 5 MG PO TABS: 5.0000 mg | ORAL_TABLET | ORAL | Status: DC | PRN

## 2022-07-16 SURGICAL SUPPLY — 50 items
ADH SKN CLS APL DERMABOND .7 (GAUZE/BANDAGES/DRESSINGS)
APL PRP STRL LF DISP 70% ISPRP (MISCELLANEOUS) ×1
APL SKNCLS STERI-STRIP NONHPOA (GAUZE/BANDAGES/DRESSINGS)
BAG COUNTER SPONGE SURGICOUNT (BAG) IMPLANT
BAG SPNG CNTER NS LX DISP (BAG)
BENZOIN TINCTURE PRP APPL 2/3 (GAUZE/BANDAGES/DRESSINGS) IMPLANT
BINDER ABDOMINAL 12 ML 46-62 (SOFTGOODS) IMPLANT
CABLE HIGH FREQUENCY MONO STRZ (ELECTRODE) IMPLANT
CHLORAPREP W/TINT 26 (MISCELLANEOUS) ×1 IMPLANT
COVER SURGICAL LIGHT HANDLE (MISCELLANEOUS) ×1 IMPLANT
DERMABOND ADVANCED .7 DNX12 (GAUZE/BANDAGES/DRESSINGS) IMPLANT
DEVICE SECURE STRAP 25 ABSORB (INSTRUMENTS) IMPLANT
DEVICE TROCAR PUNCTURE CLOSURE (ENDOMECHANICALS) ×1 IMPLANT
DRAIN CHANNEL 19F RND (DRAIN) IMPLANT
ELECT L-HOOK LAP 45CM DISP (ELECTROSURGICAL) ×1
ELECT PENCIL ROCKER SW 15FT (MISCELLANEOUS) IMPLANT
ELECTRODE L-HOOK LAP 45CM DISP (ELECTROSURGICAL) IMPLANT
EVACUATOR SILICONE 100CC (DRAIN) IMPLANT
GLOVE BIO SURGEON STRL SZ7.5 (GLOVE) ×1 IMPLANT
GLOVE INDICATOR 8.0 STRL GRN (GLOVE) ×1 IMPLANT
GOWN STRL REUS W/ TWL XL LVL3 (GOWN DISPOSABLE) ×3 IMPLANT
GOWN STRL REUS W/TWL XL LVL3 (GOWN DISPOSABLE) ×3
IRRIG SUCT STRYKERFLOW 2 WTIP (MISCELLANEOUS)
IRRIGATION SUCT STRKRFLW 2 WTP (MISCELLANEOUS) IMPLANT
KIT BASIN OR (CUSTOM PROCEDURE TRAY) ×1 IMPLANT
KIT TURNOVER KIT A (KITS) IMPLANT
L-HOOK LAP DISP 36CM (ELECTROSURGICAL)
LHOOK LAP DISP 36CM (ELECTROSURGICAL) IMPLANT
MARKER SKIN DUAL TIP RULER LAB (MISCELLANEOUS) ×1 IMPLANT
MESH VENTRALIGHT ST 6IN CRC (Mesh General) IMPLANT
NDL SPNL 22GX3.5 QUINCKE BK (NEEDLE) ×1 IMPLANT
NEEDLE SPNL 22GX3.5 QUINCKE BK (NEEDLE) ×1 IMPLANT
SCISSORS LAP 5X35 DISP (ENDOMECHANICALS) IMPLANT
SET TUBE SMOKE EVAC HIGH FLOW (TUBING) ×1 IMPLANT
SHEARS HARMONIC ACE PLUS 36CM (ENDOMECHANICALS) IMPLANT
SLEEVE ADV FIXATION 5X100MM (TROCAR) IMPLANT
SPIKE FLUID TRANSFER (MISCELLANEOUS) ×1 IMPLANT
STRIP CLOSURE SKIN 1/2X4 (GAUZE/BANDAGES/DRESSINGS) IMPLANT
SUT ETHILON 2 0 PS N (SUTURE) IMPLANT
SUT MNCRL AB 4-0 PS2 18 (SUTURE) ×1 IMPLANT
SUT NOVA 1 T20/GS 25DT (SUTURE) IMPLANT
SUT PDS AB 2-0 CT2 27 (SUTURE) IMPLANT
SUT VIC AB 3-0 SH 18 (SUTURE) IMPLANT
TOWEL OR 17X26 10 PK STRL BLUE (TOWEL DISPOSABLE) ×1 IMPLANT
TRAY FOLEY MTR SLVR 14FR STAT (SET/KITS/TRAYS/PACK) IMPLANT
TRAY FOLEY MTR SLVR 16FR STAT (SET/KITS/TRAYS/PACK) IMPLANT
TRAY LAPAROSCOPIC (CUSTOM PROCEDURE TRAY) ×1 IMPLANT
TROCAR ADV FIXATION 12X100MM (TROCAR) IMPLANT
TROCAR ADV FIXATION 5X100MM (TROCAR) ×1 IMPLANT
TROCAR XCEL NON-BLD 5MMX100MML (ENDOMECHANICALS) ×1 IMPLANT

## 2022-07-16 NOTE — Plan of Care (Signed)
  Problem: Education: Goal: Knowledge of General Education information will improve Description Including pain rating scale, medication(s)/side effects and non-pharmacologic comfort measures Outcome: Progressing   

## 2022-07-16 NOTE — H&P (Signed)
PROVIDER: Jalisa Sacco Sherril Cong, MD  MRN: [id number] DOB: 12-08-1945 DATE OF ENCOUNTER: 06/23/2022 Subjective  Chief Complaint: New Consultation (Ventral hernia)   History of Present Illness: Jeremy Fox is a 77 y.o. male who is seen today for a ventral hernia..  Patient is referred by his PCP for evaluation of a ventral hernia. His comorbidities are hypertension, obstructive sleep apnea  Patient underwent laparoscopic robotic assisted prostatectomy last April in 2023. He states he was doing some recent yard work and had something leaning against his abdomen when he felt something bulge out. He states it is getting larger. He denies any nausea vomiting, diarrhea or constipation. It does bother him at times. He denies any neuropathic pain. He denies any chest pain, chest pressure, shortness of breath, dyspnea on exertion, TIAs or amaurosis fugax. Denies any blood thinners.  Review of Systems: A complete review of systems was obtained from the patient. I have reviewed this information and discussed as appropriate with the patient. See HPI as well for other ROS.  ROS  Medical History: Past Medical History: Diagnosis Date Hypertension Sleep apnea  There is no problem list on file for this patient.  Past Surgical History: Procedure Laterality Date Knee surgery left-10/22 and Right 1/23   Allergies Allergen Reactions Sulfa (Sulfonamide Antibiotics) Rash Sulfamethoxazole Rash  Current Outpatient Medications on File Prior to Visit Medication Sig Dispense Refill finasteride (PROSCAR) 5 mg tablet Take 5 mg by mouth once daily lisinopriL-hydroCHLOROthiazide (ZESTORETIC) 20-25 mg tablet Take by mouth minoxidiL 2.5 MG tablet Take by mouth  No current facility-administered medications on file prior to visit.  Family History Problem Relation Age of Onset High blood pressure (Hypertension) Brother Diabetes Brother   Social History  Tobacco Use Smoking Status Never Smokeless  Tobacco Never   Social History  Socioeconomic History Marital status: Single Tobacco Use Smoking status: Never Smokeless tobacco: Never Vaping Use Vaping status: Never Used Substance and Sexual Activity Alcohol use: Not Currently Drug use: Never  Objective:  Vitals: 06/23/22 0923 BP: (!) 140/82 Pulse: 88 Temp: 36.7 C (98.1 F) SpO2: 98% Weight: (!) 109.2 kg (240 lb 12.8 oz) Height: 182.9 cm (6') PainSc: 0-No pain  Body mass index is 32.66 kg/m.  Gen: alert, NAD, non-toxic appearing mild obesity Pupils: equal, no scleral icterus Pulm: Lungs clear to auscultation, symmetric chest rise CV: regular rate and rhythm Abd: soft, nontender, nondistended. old trocar sites. No cellulitis. incisional hernia at the supraumbilical incision. There is a obvious bulge. Patient was examined standing as well as supine. The defect is approximately 3-1/2 cm x 3-1/2 cm. Ext: no edema, Skin: no rash, no jaundice  Labs, Imaging and Diagnostic Testing: Pcp note 06/01/22  Cardiology office note November 10, 2021  Robotic assisted laparoscopic simple prostatectomy by Dr. Berneice Heinrich June 26, 2021  Assessment and Plan: Diagnoses and all orders for this visit:  Incisional hernia, without obstruction or gangrene    We discussed the etiology of ventral incisional hernias. We discussed the signs and symptoms of incarceration and strangulation. The patient was given educational material. I also drew diagrams.  We discussed nonoperative and operative management. With respect to operative management, we discussed both open repair and laparoscopic repair and laparoscopic assisted (primary muscle repair with mesh underlay). We discussed the pros and cons of each approach. I discussed the typical aftercare with each procedure and how each procedure differs.  Recommended a laparoscopic assisted approach with mesh underlay  We discussed the risk and benefits of surgery including but not limited to  bleeding, infection, injury to surrounding structures, hernia recurrence, mesh complications, hematoma/seroma formation, need to convert to an open procedure, blood clot formation, urinary retention, post operative ileus, general anesthesia risk, long-term abdominal pain. We discussed that this procedure can be quite uncomfortable and difficult to recover from based on how the mesh is secured to the abdominal wall. We discussed the importance of avoiding heavy lifting and straining for a period of 6 weeks. We discussed postoperative pain control with nonopioid analgesia. We discussed the typical recovery.  The patient has elected to proceed with surgery. Once we have obtained medical clearance our office will contact him to schedule surgery.  Medical decision making, moderate medical data reviewed with decision to proceed with elective surgery No follow-ups on file.  Mary Sella. Andrey Campanile MD FACS General, Minimally Invasive, & Bariatric Surgery Electronically signed by Gara Kroner, MD at 06/23/2022 10:17 AM EDT

## 2022-07-16 NOTE — Interval H&P Note (Signed)
History and Physical Interval Note:  07/16/2022 10:42 AM  Jeremy Fox  has presented today for surgery, with the diagnosis of INCISIONAL HERNIA.  The various methods of treatment have been discussed with the patient and family. After consideration of risks, benefits and other options for treatment, the patient has consented to  Procedure(s): LAPAROSCOPIC INCISIONAL HERNIA REPAIR WITH MESH (N/A) as a surgical intervention.  The patient's history has been reviewed, patient examined, no change in status, stable for surgery.  I have reviewed the patient's chart and labs.  Questions were answered to the patient's satisfaction.     Gaynelle Adu

## 2022-07-16 NOTE — Anesthesia Procedure Notes (Signed)
Procedure Name: Intubation Date/Time: 07/16/2022 11:29 AM  Performed by: Garth Bigness, CRNAPre-anesthesia Checklist: Patient identified, Emergency Drugs available, Suction available, Patient being monitored and Timeout performed Patient Re-evaluated:Patient Re-evaluated prior to induction Oxygen Delivery Method: Circle system utilized Preoxygenation: Pre-oxygenation with 100% oxygen Induction Type: IV induction Ventilation: Mask ventilation without difficulty Laryngoscope Size: Mac and 4 Grade View: Grade I Tube type: Oral Tube size: 7.5 mm Number of attempts: 1 Airway Equipment and Method: Stylet Placement Confirmation: ETT inserted through vocal cords under direct vision, positive ETCO2, CO2 detector and breath sounds checked- equal and bilateral Secured at: 22 cm Tube secured with: Tape Dental Injury: Teeth and Oropharynx as per pre-operative assessment

## 2022-07-16 NOTE — Op Note (Signed)
PRE-OPERATIVE DIAGNOSIS:  Reducible incisional hernia with 4 x4  cm defect  POST-OPERATIVE DIAGNOSIS:  incisional hernia  PROCEDURE:  Procedure(s): LAPAROSCOPIC ASSISTED INCISIONAL HERNIA REPAIR WITH MESH with 4 X4 cm size LAPAROSCOPIC BILATERAL TAP BLOCK  SURGEON:  Surgeon(s): Gaynelle Adu, MD  ASSISTANT: none  EBL: MINIMAL  ANESTHESIA:   local and general  Indications for procedure: Jeremy Fox is a 77 y.o. year old male with symptoms of an incisional hernia.  The patient underwent robotic prostatectomy a few years ago.  He presented with a bulge at his supraumbilical trocar site.  The hernia measured 4 cm x 4 cm.  Is reducible.Marland Kitchen  Description of procedure: The patient was brought into the operative suite. Anesthesia was administered with General endotracheal anesthesia. WHO checklist was applied. The patient was then placed in supine. The area was prepped and draped in the usual sterile fashion.  Left arm tucked with the appropriate padding.  The abdomen was prepped with Chloraprep and draped in standard fashion.  Hernia measured 4 cm x 4 cm.  A 5 mm Optiview was used the cannulate the peritoneal cavity in the left upper quadrant below the costal margin.  Pneumoperitoneum was obtained by insufflating CO2, maintaining a maximum pressure of 15 mmHg.  The 5 mm 30-degree laparoscopic was inserted.  There were no significant omental adhesions to the anterior abdominal wall in and around the hernia defect.   Another 5-mm port was placed in the left lateral abdominal wall. We visualized 1 fascial defect in the supraumbilical position at a prior incision site.  I then took down the falciform ligament with Endo Shears with electrocautery.      I then proceeded with the open portion of the procedure.  A  incision was created through his old scar.. Dissection was carried down to the hernia sac located above the fascia and was mobilized from surrounding structures. Intact fascia was identified  circumferentially around the defect.    Skin and soft tissue was mobilized from the surface of the fascia in a circumferential manner.  I then measured the defect.  It measured  4 cm x 4 cm.  With adding a 5 cm overlap circumferentially this gave [place] a mesh requirement of a round 12 cm.  Therefore I selected a round piece of Bard ventral light ST mesh measuring 15.2 cm We placed 4 stay sutures of 2-0 PDS around the edges of the mesh.  The mesh was then rolled up and inserted through the fascial defect. The fascia was then closed primarily over the mesh with 7 interrupted 1-0-novafil sutures.   I then returned laparoscopically.  I then infiltrated LOCAL in a regional fashion in the preperitoneal space around the umbilical fascial closure and around the expected locations of where the transfascial sutures would be placed.  The mesh was then unrolled.  The stay sutures were then pulled up through small stab incisions using the Endo-close device.  This deployed the mesh widely over the fascial defects.  The stay sutures were then tied down.  The Secure Strap device was then used to tack down the edges of the mesh at 1 cm intervals circumferentially. We placed a few tacks inside the outer ring of tacks.  We inspected for hemostasis.  Bilateral laparoscopic tap block was performed for postoperative pain relief.  Pneumoperitoneum was then released as we removed the remainder of the trocars.   Deep dermis was reapproximated at the supraumbilical incision with interrupted 3-0 Vicryl sutures. The skin was closed  with a 4-0 monocryl subcuticular suture. Dermabond was put in place for dressing. The patient awoke from anesthesia and was brought to pacu in stable condition. All counts were correct.  Findings: Incisional hernia in the supraumbilical position, reducible.  Hernia measured 4 cm x 4 cm preoperatively and at the beginning of the procedure  Type of repair - primary suture repair with mesh underlay  Name of  mesh - BARD VENTRALIGHTST  Size of mesh - ROUND 15.2CM  Mesh overlap - >5 cm  Placement of mesh - beneath fascia and into peritoneal cavity  Specimen: NONE  Blood loss: MINIMAL ml  Local anesthesia: 50 ml Marcaine  Complications: NONE  PLAN OF CARE: Admit for overnight observation  PATIENT DISPOSITION:  PACU - hemodynamically stable.  Gaynelle Adu, MD  07/16/2022 1:16 PM

## 2022-07-16 NOTE — Transfer of Care (Signed)
Immediate Anesthesia Transfer of Care Note  Patient: Jeremy Fox  Procedure(s) Performed: LAPAROSCOPIC INCISIONAL HERNIA REPAIR WITH MESH  Patient Location: PACU  Anesthesia Type:General  Level of Consciousness: awake, alert , oriented, and patient cooperative  Airway & Oxygen Therapy: Patient Spontanous Breathing and Patient connected to face mask oxygen  Post-op Assessment: Report given to RN and Post -op Vital signs reviewed and stable  Post vital signs: Reviewed and stable  Last Vitals:  Vitals Value Taken Time  BP 183/66 07/16/22 1300  Temp 36.4 C 07/16/22 1300  Pulse 71 07/16/22 1306  Resp 15 07/16/22 1306  SpO2 100 % 07/16/22 1306  Vitals shown include unvalidated device data.  Last Pain:  Vitals:   07/16/22 0836  PainSc: 0-No pain         Complications: No notable events documented.

## 2022-07-17 DIAGNOSIS — K432 Incisional hernia without obstruction or gangrene: Secondary | ICD-10-CM | POA: Diagnosis present

## 2022-07-17 DIAGNOSIS — Z79899 Other long term (current) drug therapy: Secondary | ICD-10-CM | POA: Diagnosis not present

## 2022-07-17 DIAGNOSIS — Z8719 Personal history of other diseases of the digestive system: Secondary | ICD-10-CM

## 2022-07-17 DIAGNOSIS — E669 Obesity, unspecified: Secondary | ICD-10-CM | POA: Diagnosis present

## 2022-07-17 DIAGNOSIS — Z9079 Acquired absence of other genital organ(s): Secondary | ICD-10-CM | POA: Diagnosis not present

## 2022-07-17 DIAGNOSIS — Z96653 Presence of artificial knee joint, bilateral: Secondary | ICD-10-CM | POA: Diagnosis present

## 2022-07-17 DIAGNOSIS — Z882 Allergy status to sulfonamides status: Secondary | ICD-10-CM | POA: Diagnosis not present

## 2022-07-17 DIAGNOSIS — N400 Enlarged prostate without lower urinary tract symptoms: Secondary | ICD-10-CM | POA: Diagnosis present

## 2022-07-17 DIAGNOSIS — G4733 Obstructive sleep apnea (adult) (pediatric): Secondary | ICD-10-CM | POA: Diagnosis present

## 2022-07-17 DIAGNOSIS — I119 Hypertensive heart 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: Secondary | ICD-10-CM | POA: Diagnosis present

## 2022-07-17 DIAGNOSIS — Z9889 Other specified postprocedural states: Secondary | ICD-10-CM

## 2022-07-17 DIAGNOSIS — E876 Hypokalemia: Secondary | ICD-10-CM | POA: Diagnosis not present

## 2022-07-17 DIAGNOSIS — Z6832 Body mass index (BMI) 32.0-32.9, adult: Secondary | ICD-10-CM | POA: Diagnosis not present

## 2022-07-17 DIAGNOSIS — E785 Hyperlipidemia, unspecified: Secondary | ICD-10-CM | POA: Diagnosis present

## 2022-07-17 DIAGNOSIS — Z8249 Family history of ischemic heart disease and other diseases of the circulatory system: Secondary | ICD-10-CM | POA: Diagnosis not present

## 2022-07-17 DIAGNOSIS — E871 Hypo-osmolality and hyponatremia: Secondary | ICD-10-CM | POA: Diagnosis present

## 2022-07-17 DIAGNOSIS — Z833 Family history of diabetes mellitus: Secondary | ICD-10-CM | POA: Diagnosis not present

## 2022-07-17 LAB — BASIC METABOLIC PANEL
Anion gap: 10 (ref 5–15)
Anion gap: 9 (ref 5–15)
Anion gap: 10 (ref 5–15)
Anion gap: 11 (ref 5–15)
BUN: 16 mg/dL (ref 8–23)
BUN: 16 mg/dL (ref 8–23)
BUN: 18 mg/dL (ref 8–23)
BUN: 22 mg/dL (ref 8–23)
CO2: 26 mmol/L (ref 22–32)
CO2: 27 mmol/L (ref 22–32)
CO2: 24 mmol/L (ref 22–32)
CO2: 26 mmol/L (ref 22–32)
Calcium: 7.8 mg/dL — ABNORMAL LOW (ref 8.9–10.3)
Calcium: 7.8 mg/dL — ABNORMAL LOW (ref 8.9–10.3)
Calcium: 7.8 mg/dL — ABNORMAL LOW (ref 8.9–10.3)
Calcium: 7.6 mg/dL — ABNORMAL LOW (ref 8.9–10.3)
Chloride: 93 mmol/L — ABNORMAL LOW (ref 98–111)
Chloride: 89 mmol/L — ABNORMAL LOW (ref 98–111)
Chloride: 92 mmol/L — ABNORMAL LOW (ref 98–111)
Chloride: 96 mmol/L — ABNORMAL LOW (ref 98–111)
Creatinine, Ser: 0.99 mg/dL (ref 0.61–1.24)
Creatinine, Ser: 1.02 mg/dL (ref 0.61–1.24)
Creatinine, Ser: 1.02 mg/dL (ref 0.61–1.24)
Creatinine, Ser: 1.06 mg/dL (ref 0.61–1.24)
GFR, Estimated: >60 mL/min (ref >60)
GFR, Estimated: >60 mL/min (ref >60)
GFR, Estimated: >60 mL/min (ref >60)
GFR, Estimated: >60 mL/min (ref >60)
Glucose, Bld: 146 mg/dL — ABNORMAL HIGH (ref 70–99)
Glucose, Bld: 111 mg/dL — ABNORMAL HIGH (ref 70–99)
Glucose, Bld: 130 mg/dL — ABNORMAL HIGH (ref 70–99)
Glucose, Bld: 114 mg/dL — ABNORMAL HIGH (ref 70–99)
Potassium: 3.5 mmol/L (ref 3.5–5.1)
Potassium: 3 mmol/L — ABNORMAL LOW (ref 3.5–5.1)
Potassium: 3 mmol/L — ABNORMAL LOW (ref 3.5–5.1)
Potassium: 2.9 mmol/L — ABNORMAL LOW (ref 3.5–5.1)
Sodium: 129 mmol/L — ABNORMAL LOW (ref 135–145)
Sodium: 125 mmol/L — ABNORMAL LOW (ref 135–145)
Sodium: 126 mmol/L — ABNORMAL LOW (ref 135–145)
Sodium: 133 mmol/L — ABNORMAL LOW (ref 135–145)

## 2022-07-17 LAB — CBC
HCT: 40.5 % (ref 39.0–52.0)
Hemoglobin: 14.3 g/dL (ref 13.0–17.0)
MCH: 29.4 pg (ref 26.0–34.0)
MCHC: 35.3 g/dL (ref 30.0–36.0)
MCV: 83.2 fL (ref 80.0–100.0)
Platelets: 222 10*3/uL (ref 150–400)
RBC: 4.87 MIL/uL (ref 4.22–5.81)
RDW: 12.2 % (ref 11.5–15.5)
WBC: 10.3 10*3/uL (ref 4.0–10.5)
nRBC: 0 % (ref 0.0–0.2)

## 2022-07-17 LAB — MAGNESIUM: Magnesium: 1.9 mg/dL (ref 1.7–2.4)

## 2022-07-17 MED ORDER — SODIUM CHLORIDE 0.9 % IV SOLN: INTRAVENOUS | Status: DC

## 2022-07-17 MED ORDER — DEXTROSE-NACL 5-0.9 % IV SOLN: INTRAVENOUS | Status: DC

## 2022-07-17 MED ORDER — POTASSIUM CHLORIDE CRYS ER 20 MEQ PO TBCR
40.0000 meq | EXTENDED_RELEASE_TABLET | Freq: Once | ORAL | Status: AC
  Administered 2022-07-17: 40 meq via ORAL
  Filled 2022-07-17: qty 2

## 2022-07-17 MED ORDER — HYDRALAZINE HCL 25 MG PO TABS
25.0000 mg | ORAL_TABLET | Freq: Three times a day (TID) | ORAL | Status: DC
  Administered 2022-07-17 (×2): 25 mg via ORAL
  Filled 2022-07-17 (×2): qty 1

## 2022-07-17 MED ORDER — PANTOPRAZOLE SODIUM 40 MG PO TBEC
40.0000 mg | DELAYED_RELEASE_TABLET | Freq: Every day | ORAL | Status: DC
  Administered 2022-07-17: 40 mg via ORAL
  Filled 2022-07-17: qty 1

## 2022-07-17 NOTE — Plan of Care (Signed)

## 2022-07-17 NOTE — Progress Notes (Signed)
1 Day Post-Op   Subjective/Chief Complaint: Some discomfort overnight.  Patient does not want to take any opioids.  No nausea or vomiting.  Tolerating a diet.  Sore in his abdomen has some questions about discharge restrictions which we had previously discussed  Objective: Vital signs in last 24 hours: Temp:  [97.5 F (36.4 C)-98.9 F (37.2 C)] 97.6 F (36.4 C) (05/11 0906) Pulse Rate:  [64-92] 74 (05/11 0906) Resp:  [10-19] 18 (05/11 0906) BP: (157-188)/(66-83) 157/82 (05/11 0906) SpO2:  [94 %-100 %] 98 % (05/11 0906) Last BM Date : 07/16/22  Intake/Output from previous day: 05/10 0701 - 05/11 0700 In: 1685.8 [P.O.:240; I.V.:1345.8; IV Piggyback:100] Out: 830 [Urine:825; Blood:5] Intake/Output this shift: Total I/O In: 565.8 [P.O.:340; I.V.:225.8] Out: 325 [Urine:325]  Alert, no apparent distress, nontoxic Symmetric chest rise, nonlabored Regular Soft, typical full abdomen, incisions okay, wearing an abdominal binder.  No cellulitis appropriate tenderness to palpation Edema  Lab Results:  Recent Labs    07/17/22 0324  WBC 10.3  HGB 14.3  HCT 40.5  PLT 222   BMET Recent Labs    07/17/22 0324 07/17/22 1124  NA 129* 125*  K 3.5 3.0*  CL 93* 89*  CO2 26 27  GLUCOSE 146* 111*  BUN 16 16  CREATININE 0.99 1.02  CALCIUM 7.8* 7.8*   PT/INR No results for input(s): "LABPROT", "INR" in the last 72 hours. ABG No results for input(s): "PHART", "HCO3" in the last 72 hours.  Invalid input(s): "PCO2", "PO2"  Studies/Results: No results found.  Anti-infectives: Anti-infectives (From admission, onward)    Start     Dose/Rate Route Frequency Ordered Stop   07/16/22 0815  ceFAZolin (ANCEF) IVPB 2g/100 mL premix        2 g 200 mL/hr over 30 Minutes Intravenous On call to O.R. 07/16/22 0814 07/16/22 1156       Assessment/Plan: s/p Procedure(s): LAPAROSCOPIC INCISIONAL HERNIA REPAIR WITH MESH (N/A)  Continue diet as tolerated Continue chemical VTE  prophylaxis Discussed pain control.-Continue scheduled Tylenol, muscle relaxant Hyponatremia-sodium this morning was 129.  Repeat was 125. Hypokalemia-replace potassium  Not a candidate for discharge due to worsening hyponatremia I updated his friend on phone earlier today discussing lab results and how he was doing.  She states that he is on a low-sodium diet.  Will consult [facility] hospitalist to assist with hyponatremia management Repeat labs in morning  Mary Sella. Andrey Campanile, MD, FACS General, Bariatric, & Minimally Invasive Surgery [facility],  [name]   LOS: 0 days    Gaynelle Adu 07/17/2022

## 2022-07-17 NOTE — Consult Note (Signed)
[facility] Initial Consultation Note  [name]:[id number] DOB: [birth year] DOA: 07/16/2022  PCP: Loyola Mast, MD   Requesting Physician: Dr. Gaynelle Adu  Reason for Consultation: Medical Management  HPI: Jeremy Fox is a 77 y.o. male with medical history significant for BPH, hypertension, hyperlipidemia who is 1 day postop laparoscopic assisted incisional hernia repair with Dr. Andrey Campanile.  Patient is overall doing well and tolerating his postoperative period, hospitalist consult was requested for hyponatremia.  Most recently, his sodium level was normal on 5/7, upon labs earlier this morning his sodium was 129, recheck at 11 AM was 125.  Patient denies any confusion, chest pain, nausea, vomiting.  Dr. Andrey Campanile relates that the patient is asking somewhat repetitive questions.    The patient tells me that he was on amlodipine for blood pressure previously, but this was discontinued due to lower extremity edema.  He has been on hydrochlorothiazide lisinopril for about a year.  Postoperatively, the patient has been on D5 half-normal saline.  Review of Systems: Please see HPI for pertinent positives and negatives. A complete 10 system review of systems are otherwise negative.  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 He has never been exposed to tobacco smoke. He has never used smokeless tobacco. He reports that he does not drink alcohol and does not use drugs.  Allergies  Allergen Reactions   Sulfa Antibiotics Rash   Sulfamethoxazole Rash    Family History  Problem Relation Age of Onset   Heart attack Father    Heart disease Father    Diabetes Brother      Prior to Admission medications   Medication Sig Start Date End Date Taking? Authorizing Provider  acetaminophen (TYLENOL) 500 MG tablet Take 1,000 mg by mouth every 6 (six) hours as needed for mild pain.   Yes [provider]  aspirin EC 81 MG tablet Take 81 mg by mouth daily. Swallow whole.   Yes [provider]  finasteride (PROSCAR) 5 MG tablet Take 5 mg by mouth daily. 04/13/21  Yes [provider]  lisinopril-hydrochlorothiazide (ZESTORETIC) 20-25 MG tablet Take 1 tablet by mouth in the morning and at bedtime. 01/08/22  Yes Loyola Mast, MD  minoxidil (LONITEN) 2.5 MG tablet Take 1 tablet (2.5 mg total) by mouth daily. 03/02/22  Yes Loyola Mast, MD    Physical Exam: BP (!) 157/82 (BP Location: Right Arm)   Pulse 74   Temp 97.6 F (36.4 C)   Resp 18   Ht 6' (1.829 m)   Wt 108.4 kg   SpO2 98%   BMI 32.41 kg/m  General:  Alert, oriented, calm, in no acute distress  Eyes: EOMI, clear conjuctivae, white sclerea Neck: supple, no masses, trachea mildline  Cardiovascular: RRR, no murmurs or rubs, no peripheral edema  Respiratory: clear to auscultation bilaterally, no wheezes, no crackles  Abdomen: soft, tender, nondistended, abdominal binder in place Skin: dry, no rashes  Musculoskeletal: no joint effusions, normal range of motion  Psychiatric: appropriate affect, normal speech  Neurologic: extraocular muscles intact,  clear speech, moving all extremities with intact sensorium          Recent Labs and Imaging Reviewed:  Basic Metabolic Panel: Recent Labs  Lab 07/13/22 0844 07/17/22 0324 07/17/22 1124  NA 135 129* 125*  K 3.3* 3.5 3.0*  CL 97* 93* 89*  CO2 29 26 27   GLUCOSE 93 146* 111*  BUN 20 16 16   CREATININE 0.98 0.99 1.02  CALCIUM 8.6* 7.8* 7.8*   Liver Function Tests: No results for input(s): "AST", "ALT", "ALKPHOS", "BILITOT", "PROT", "ALBUMIN" in the last 168 hours. No results for input(s): "LIPASE", "AMYLASE" in the last 168 hours. No results for input(s): "AMMONIA" in the last 168 hours. CBC: Recent Labs  Lab 07/13/22 0844 07/17/22 0324  WBC 9.0 10.3  HGB 14.6 14.3  HCT 42.3 40.5  MCV 83.4 83.2  PLT 239 222   Cardiac Enzymes: No results for input(s): "CKTOTAL", "CKMB", "CKMBINDEX", "TROPONINI" in the last 168 hours.  BNP (last 3 results) No results for input(s): "BNP" in the last 8760 hours.  ProBNP (last 3 results) Recent Labs    11/10/21 0947  PROBNP 101    CBG: No results for input(s): "GLUCAP" in the last 168 hours.  Radiological Exams on Admission: No results found.  Summary and Recommendations: This is a pleasant 77 year old gentleman with a history of hypertension, BPH, who was admitted to the hospital by general surgery and is now 1 day postop from laparoscopic assisted incisional hernia repair.  He is doing well overall in his recovery, but hospitalist was contacted for hyponatremia.  It does not seem that he is symptomatic from this.  Looking back in his history, he had 1 abnormal sodium of 129 when he was hospitalized in January 2023, otherwise does not seem to have any history of hyponatremia.  Most likely this is euvolemic hyponatremia, caused by hypotonic saline, as well as ACE inhibitor and hydrochlorothiazide. -Discontinue hypotonic saline -Start D5 normal saline -Continue regular diet, with 2000 cc fluid restriction -Discontinue lisinopril and  HCTZ -Start hydralazine 25 mg p.o. 3 times daily, will titrate -Recheck BMP at 2200 and again with morning labs  Thank you for involving [place] in the care of your patient. [facility] will continue to follow along with you.    Code Status: Full Code  Time spent: 55 minutes  Sonali Wivell Sharlette Dense MD [facility] Pager [phone]  If 7PM-7AM, please contact night-coverage [email or web address] Password [facility]  07/17/2022, 12:31 PM

## 2022-07-18 ENCOUNTER — Encounter (HOSPITAL_COMMUNITY): Payer: Self-pay | Admitting: General Surgery

## 2022-07-18 DIAGNOSIS — Z9889 Other specified postprocedural states: Secondary | ICD-10-CM | POA: Diagnosis not present

## 2022-07-18 DIAGNOSIS — Z8719 Personal history of other diseases of the digestive system: Secondary | ICD-10-CM | POA: Diagnosis not present

## 2022-07-18 LAB — CBC
HCT: 37.4 % — ABNORMAL LOW (ref 39.0–52.0)
Hemoglobin: 12.8 g/dL — ABNORMAL LOW (ref 13.0–17.0)
MCH: 28.9 pg (ref 26.0–34.0)
MCHC: 34.2 g/dL (ref 30.0–36.0)
MCV: 84.4 fL (ref 80.0–100.0)
Platelets: 195 10*3/uL (ref 150–400)
RBC: 4.43 MIL/uL (ref 4.22–5.81)
RDW: 12.7 % (ref 11.5–15.5)
WBC: 9.8 10*3/uL (ref 4.0–10.5)
nRBC: 0 % (ref 0.0–0.2)

## 2022-07-18 LAB — BASIC METABOLIC PANEL
Anion gap: 10 (ref 5–15)
BUN: 24 mg/dL — ABNORMAL HIGH (ref 8–23)
CO2: 26 mmol/L (ref 22–32)
Calcium: 7.9 mg/dL — ABNORMAL LOW (ref 8.9–10.3)
Chloride: 97 mmol/L — ABNORMAL LOW (ref 98–111)
Creatinine, Ser: 0.98 mg/dL (ref 0.61–1.24)
GFR, Estimated: >60 mL/min (ref >60)
Glucose, Bld: 104 mg/dL — ABNORMAL HIGH (ref 70–99)
Potassium: 3.4 mmol/L — ABNORMAL LOW (ref 3.5–5.1)
Sodium: 133 mmol/L — ABNORMAL LOW (ref 135–145)

## 2022-07-18 MED ORDER — POTASSIUM CHLORIDE CRYS ER 20 MEQ PO TBCR
30.0000 meq | EXTENDED_RELEASE_TABLET | ORAL | Status: DC
  Administered 2022-07-18: 30 meq via ORAL
  Filled 2022-07-18: qty 1

## 2022-07-18 MED ORDER — LISINOPRIL 20 MG PO TABS
20.0000 mg | ORAL_TABLET | Freq: Every day | ORAL | Status: DC
  Administered 2022-07-18: 20 mg via ORAL
  Filled 2022-07-18: qty 1

## 2022-07-18 MED ORDER — IBUPROFEN 600 MG PO TABS: 600.0000 mg | ORAL_TABLET | Freq: Three times a day (TID) | ORAL | 0 refills | Status: DC | PRN

## 2022-07-18 MED ORDER — LISINOPRIL 20 MG PO TABS: 20.0000 mg | ORAL_TABLET | Freq: Every day | ORAL | 2 refills | Status: DC

## 2022-07-18 MED ORDER — METHOCARBAMOL 750 MG PO TABS: 750.0000 mg | ORAL_TABLET | Freq: Four times a day (QID) | ORAL | 0 refills | Status: DC | PRN

## 2022-07-18 MED ORDER — HYDRALAZINE HCL 25 MG PO TABS: 25.0000 mg | ORAL_TABLET | Freq: Four times a day (QID) | ORAL | Status: DC | PRN

## 2022-07-18 NOTE — Progress Notes (Signed)
2 Days Post-Op   Subjective/Chief Complaint: NO N/V Pain overnight but better now No burping No bm   Objective: Vital signs in last 24 hours: Temp:  [97.4 F (36.3 C)-98.1 F (36.7 C)] 97.4 F (36.3 C) (05/12 0449) Pulse Rate:  [68-81] 71 (05/12 0449) Resp:  [18-20] 18 (05/12 0449) BP: (158-191)/(57-81) 173/79 (05/12 0544) SpO2:  [94 %-97 %] 97 % (05/12 0449) FiO2 (%):  [21 %] 21 % (05/11 2244) Last BM Date : 07/17/22  Intake/Output from previous day: 05/11 0701 - 05/12 0700 In: 1705.8 [P.O.:700; I.V.:1005.8] Out: 2025 [Urine:2025] Intake/Output this shift: Total I/O In: 240 [P.O.:240] Out: -   Alert, nontoxic Walking in room Soft, min TTP, incisions ok, nd  Lab Results:  Recent Labs    07/17/22 0324 07/18/22 0329  WBC 10.3 9.8  HGB 14.3 12.8*  HCT 40.5 37.4*  PLT 222 195   BMET Recent Labs    07/17/22 2200 07/18/22 0329  NA 133* 133*  K 2.9* 3.4*  CL 96* 97*  CO2 26 26  GLUCOSE 114* 104*  BUN 22 24*  CREATININE 1.06 0.98  CALCIUM 7.6* 7.9*   PT/INR No results for input(s): "LABPROT", "INR" in the last 72 hours. ABG No results for input(s): "PHART", "HCO3" in the last 72 hours.  Invalid input(s): "PCO2", "PO2"  Studies/Results: No results found.  Anti-infectives: Anti-infectives (From admission, onward)    Start     Dose/Rate Route Frequency Ordered Stop   07/16/22 0815  ceFAZolin (ANCEF) IVPB 2g/100 mL premix        2 g 200 mL/hr over 30 Minutes Intravenous On call to O.R. 07/16/22 0814 07/16/22 1156       Assessment/Plan: s/p Procedure(s): LAPAROSCOPIC INCISIONAL HERNIA REPAIR WITH MESH (N/A)  Hyponatremia -resolved  Hypokalemia - being replaced by [facility]  Ok for dc Rediscussed dc instructions TRH stopped his combo bp med and just rx ace-I with outpt f/u with his pcp Pt declined opioids so will manage pain with tylenol/short course ibuprofen, muscle relaxer  Appreciate [facility] assist  LOS: 1 day    Gaynelle Adu 07/18/2022

## 2022-07-18 NOTE — Progress Notes (Signed)
PROGRESS NOTE    Jeremy Fox  WUJ:[id number] DOB: [birth year] DOA: 07/16/2022 PCP: Loyola Mast, MD    Brief Narrative:   Jeremy Fox is a 77 y.o. male with past medical history significant for essential pretension, hyperlipidemia, BPH who was admitted by general surgery for incisional hernia underwent laparoscopic assisted hernia repair on 07/16/2022 by Dr. Andrey Campanile.  [facility] consulted for assistance regarding hyponatremia  Assessment & Plan:   Hyponatremia On postoperative day #1, patient's sodium was noted to be 129 which trended down to a low of 125.  Following surgery patient was placed on hypotonic solution with D5 half-normal saline as well as patient with underlying use of hydrochlorothiazide, likely contributing factor.  HCTZ was discontinued and IV fluids changed to normal saline with improvement of sodium to 133.  Will discontinue his home hydrochlorothiazide for now.  Recommend follow-up with PCP in 1 week with repeat labs.  Essential hypertension On combination lisinopril 20 mg and HCTZ 25 mg p.o. daily.  Developed hyponatremia during his hospitalization, could be just related to the hypotonic saline use versus contributing factor of HCTZ.  Will discontinue HCTZ on discharge and continue lisinopril 20 mg p.o. daily.  Recommend follow-up with PCP 1 week with repeat BMP.  Main be just able to uptitrate lisinopril if needed for further hypertension control versus reinitiation of HCTZ if sodium remains stable.  BPH Continue finasteride, outpatient follow-up with urology, Dr. Berneice Heinrich   DVT prophylaxis: enoxaparin (LOVENOX) injection 40 mg Start: 07/17/22 0800 SCD's Start: 07/16/22 1648    Code Status: Full Code Family Communication: No family present at bedside   Subjective: Patient seen examined bedside, resting currently.  Lying in bed.  Feels well.  States wants to go home this morning.  Sodium improved to 133.  Discussed with patient will discontinue his  hydrochlorothiazide for now and just remain on lisinopril in which his PCP can uptitrate if necessary.  Discussed with general surgery, Dr. Andrey Campanile who is planning discharge home today.  Patient reports some mild tenderness to his abdomen, which is expected following recent surgery.  Patient with no other complaints or concerns at this time.  Denies headache, no chest pain, palpitations, no shortness of breath, no fever/chills/night sweats, no nausea/vomiting/diarrhea, no focal weakness, no fatigue.  No acute events overnight per nurse staff.  Objective: Vitals:   07/17/22 1837 07/17/22 2149 07/18/22 0449 07/18/22 0544  BP: (!) 162/77 (!) 158/75 (!) 185/81 (!) 173/79  Pulse: 68 70 71   Resp:  18 18   Temp:  98.1 F (36.7 C) (!) 97.4 F (36.3 C)   TempSrc:  Oral Oral   SpO2:  94% 97%   Weight:      Height:        Intake/Output Summary (Last 24 hours) at 07/18/2022 1059 Last data filed at 07/18/2022 0900 Gross per 24 hour  Intake 1480 ml  Output 1750 ml  Net -270 ml   Filed Weights   07/16/22 0836  Weight: 108.4 kg    Examination:  Physical Exam: GEN: NAD, alert and oriented x 3, obese HEENT: NCAT, PERRL, EOMI, sclera clear, MMM PULM: CTAB w/o wheezes/crackles, normal respiratory effort, on room air CV: RRR w/o M/G/R GI: abd soft, abdominal binder noted in place, NABS MSK: no peripheral edema, muscle strength globally intact 5/5 bilateral upper/lower extremities NEURO: CN II-XII intact, no focal deficits, sensation to light touch intact PSYCH: normal mood/affect Integumentary: No concerning rashes/lesions/wounds noted on exposed skin surfaces    Data Reviewed:  I have personally reviewed following labs and imaging studies  CBC: Recent Labs  Lab 07/13/22 0844 07/17/22 0324 07/18/22 0329  WBC 9.0 10.3 9.8  HGB 14.6 14.3 12.8*  HCT 42.3 40.5 37.4*  MCV 83.4 83.2 84.4  PLT 239 222 195   Basic Metabolic Panel: Recent Labs  Lab 07/17/22 0324 07/17/22 1124  07/17/22 1530 07/17/22 2200 07/18/22 0329  NA 129* 125* 126* 133* 133*  K 3.5 3.0* 3.0* 2.9* 3.4*  CL 93* 89* 92* 96* 97*  CO2 26 27 24 26 26   GLUCOSE 146* 111* 130* 114* 104*  BUN 16 16 18 22  24*  CREATININE 0.99 1.02 1.02 1.06 0.98  CALCIUM 7.8* 7.8* 7.8* 7.6* 7.9*  MG 1.9  --   --   --   --    GFR: Estimated Creatinine Clearance: 77.7 mL/min (by C-G formula based on SCr of 0.98 mg/dL). Liver Function Tests: No results for input(s): "AST", "ALT", "ALKPHOS", "BILITOT", "PROT", "ALBUMIN" in the last 168 hours. No results for input(s): "LIPASE", "AMYLASE" in the last 168 hours. No results for input(s): "AMMONIA" in the last 168 hours. Coagulation Profile: No results for input(s): "INR", "PROTIME" in the last 168 hours. Cardiac Enzymes: No results for input(s): "CKTOTAL", "CKMB", "CKMBINDEX", "TROPONINI" in the last 168 hours. BNP (last 3 results) Recent Labs    11/10/21 0947  PROBNP 101   HbA1C: No results for input(s): "HGBA1C" in the last 72 hours. CBG: No results for input(s): "GLUCAP" in the last 168 hours. Lipid Profile: No results for input(s): "CHOL", "HDL", "LDLCALC", "TRIG", "CHOLHDL", "LDLDIRECT" in the last 72 hours. Thyroid Function Tests: No results for input(s): "TSH", "T4TOTAL", "FREET4", "T3FREE", "THYROIDAB" in the last 72 hours. Anemia Panel: No results for input(s): "VITAMINB12", "FOLATE", "FERRITIN", "TIBC", "IRON", "RETICCTPCT" in the last 72 hours. Sepsis Labs: No results for input(s): "PROCALCITON", "LATICACIDVEN" in the last 168 hours.  No results found for this or any previous visit (from the past 240 hour(s)).       Radiology Studies: No results found.      Scheduled Meds:  acetaminophen  1,000 mg Oral Q6H   docusate sodium  100 mg Oral BID   enoxaparin (LOVENOX) injection  40 mg Subcutaneous Q24H   finasteride  5 mg Oral Daily   lisinopril  20 mg Oral Daily   minoxidil  2.5 mg Oral Daily   pantoprazole  40 mg Oral QHS    potassium chloride  30 mEq Oral Q3H   Continuous Infusions:  sodium chloride 75 mL/hr at 07/18/22 0436     LOS: 1 day    Time spent: 42 minutes spent on chart review, discussion with nursing staff, consultants, updating family and interview/physical exam; more than 50% of that time was spent in counseling and/or coordination of care.    Alvira Philips [place], DO [facility] Available via Epic secure chat 7am-7pm After these hours, please refer to coverage provider listed on [email or web address] 07/18/2022, 10:59 AM

## 2022-07-18 NOTE — Progress Notes (Signed)
The patient is alert and oriented and has been seen by his physician. The orders for discharge were written. IV has been removed. Went over discharge instructions with patient. He is being walked down to the discharge lounge with all of his belongings.

## 2022-07-18 NOTE — Plan of Care (Signed)

## 2022-07-18 NOTE — Discharge Instructions (Signed)
LAPAROSCOPIC SURGERY: POST OP INSTRUCTIONS Always review your discharge instruction sheet given to you by the facility where your surgery was performed. IF YOU HAVE DISABILITY OR FAMILY LEAVE FORMS, YOU MUST BRING THEM TO THE OFFICE FOR PROCESSING.   DO NOT GIVE THEM TO YOUR DOCTOR.  PAIN CONTROL  First take acetaminophen (Tylenol) AND/or ibuprofen (Advil) to control your pain after surgery.  Follow directions on package.  Taking acetaminophen (Tylenol) and/or ibuprofen (Advil) regularly after surgery will help to control your pain and lower the amount of prescription pain medication you may need.  You should not take more than 3,000 mg (3 grams) of acetaminophen (Tylenol) in 24 hours.  You should not take ibuprofen (Advil), aleve, motrin, naprosyn or other NSAIDS if you have a history of stomach ulcers or chronic kidney disease.  Do not take ibuprofen/motrin on an empty stomach A prescription for pain medication may be given to you upon discharge.  Take your pain medication as prescribed, if you still have uncontrolled pain after taking acetaminophen (Tylenol) or ibuprofen (Advil). Use ice packs to help control pain. If you need a refill on your pain medication, please contact your pharmacy.  They will contact our office to request authorization. Prescriptions will not be filled after 5pm or on week-ends.  HOME MEDICATIONS Take your usually prescribed medications unless otherwise directed.  DIET You should follow a light diet the first few days after arrival home.  Be sure to include lots of fluids daily. Avoid fatty, fried foods.   CONSTIPATION It is common to experience some constipation after surgery and if you are taking pain medication.  Increasing fluid intake and taking a stool softener (such as Colace) will usually help or prevent this problem from occurring.  A mild laxative (Milk of Magnesia or Miralax) should be taken according to package instructions if there are no bowel movements  after 48 hours.  WOUND/INCISION CARE Most patients will experience some swelling and bruising in the area of the incisions.  Ice packs will help.  Swelling and bruising can take several days to resolve.  Unless discharge instructions indicate otherwise, follow guidelines below  STERI-STRIPS - you may remove your outer bandages 48 hours after surgery, and you may shower at that time.  You have steri-strips (small skin tapes) in place directly over the incision.  These strips should be left on the skin for 7-10 days.   DERMABOND/SKIN GLUE - you may shower in 24 hours.  The glue will flake off over the next 2-3 weeks. Any sutures or staples will be removed at the office during your follow-up visit.  ACTIVITIES You may resume regular (light) daily activities beginning the next day--such as daily self-care, walking, climbing stairs--gradually increasing activities as tolerated.  You may have sexual intercourse when it is comfortable.  Refrain from any heavy lifting or straining until approved by your doctor. You may drive when you are no longer taking prescription pain medication, you can comfortably wear a seatbelt, and you can safely maneuver your car and apply brakes.  FOLLOW-UP You should see your doctor in the office for a follow-up appointment approximately 2-3 weeks after your surgery.  You should have been given your post-op/follow-up appointment when your surgery was scheduled.  If you did not receive a post-op/follow-up appointment, make sure that you call for this appointment within a day or two after you arrive home to insure a convenient appointment time.  OTHER INSTRUCTIONS DO NOT LIFT/PUSH/PULL ANYTHING GREATER THAN 10LB FOR 1 MONTH WEAR  ABDOMINAL BINDER  WHEN TO CALL YOUR DOCTOR: Fever over 101.0 Inability to urinate Continued bleeding from incision. Increased pain, redness, or drainage from the incision. Increasing abdominal pain  The clinic staff is available to answer your  questions during regular business hours.  Please don't hesitate to call and ask to speak to one of the nurses for clinical concerns.  If you have a medical emergency, go to the nearest emergency room or call 911.  A surgeon from [facility] is always on call at the hospital. [address], [place], [place]  [address] ? [address] [address], [place], [place]   [address] [phone] ? [phone] ? FAX [phone] Web site: [email or web address]

## 2022-07-19 ENCOUNTER — Telehealth: Payer: Self-pay

## 2022-07-19 NOTE — Transitions of Care (Post Inpatient/ED Visit) (Signed)
   07/19/2022  Name: Jeremy Fox MRN: [id number] DOB: [birth year]  Today's TOC FU Call Status: Today's TOC FU Call Status:: Successful TOC FU Call Competed TOC FU Call Complete Date: 07/19/22  Transition Care Management Follow-up Telephone Call Date of Discharge: 07/18/22 Discharge Facility: [facility]) Type of Discharge: Inpatient Admission Primary Inpatient Discharge Diagnosis:: S/P laparoscopic hernia repair How have you been since you were released from the hospital?: Better Any questions or concerns?: No  Items Reviewed: Did you receive and understand the discharge instructions provided?: Yes Medications obtained,verified, and reconciled?: Yes (Medications Reviewed) Any new allergies since your discharge?: No Dietary orders reviewed?: NA Do you have support at home?: Yes People in Home: significant other  Medications Reviewed Today: Medications Reviewed Today     Reviewed by Leigh Aurora, CMA (Certified Medical Assistant) on 07/19/22 at 1144  Med List Status: <None>   Medication Order Taking? Sig Documenting Provider Last Dose Status Informant  acetaminophen (TYLENOL) 500 MG tablet [id number] No Take 1,000 mg by mouth every 6 (six) hours as needed for mild pain. [provider] 07/15/2022 Active Self  aspirin EC 81 MG tablet [id number] No Take 81 mg by mouth daily. Swallow whole. [provider] 07/09/2022 Active Self  finasteride (PROSCAR) 5 MG tablet [id number] No Take 5 mg by mouth daily. [provider] 07/16/2022 0500 Active Self  ibuprofen (ADVIL) 600 MG tablet [id number]  Take 1 tablet (600 mg total) by mouth every 8 (eight) hours as needed for moderate pain (take with food; severe pain). Gaynelle Adu, MD  Active   lisinopril (ZESTRIL) 20 MG tablet [id number]  Take 1 tablet (20 mg total) by mouth daily. [place], Alvira Philips, DO  Active   methocarbamol (ROBAXIN-750) 750 MG tablet [id number]  Take 1 tablet (750 mg total) by mouth every 6 (six)  hours as needed for muscle spasms. Gaynelle Adu, MD  Active   minoxidil (LONITEN) 2.5 MG tablet [id number] No Take 1 tablet (2.5 mg total) by mouth daily. Loyola Mast, MD 07/16/2022 0500 Active Self            Home Care and Equipment/Supplies: Were Home Health Services Ordered?: NA Any new equipment or medical supplies ordered?: NA  Functional Questionnaire: Do you need assistance with bathing/showering or dressing?: No Do you need assistance with meal preparation?: No Do you need assistance with eating?: No Do you have difficulty maintaining continence: No Do you need assistance with getting out of bed/getting out of a chair/moving?: No Do you have difficulty managing or taking your medications?: No  Follow up appointments reviewed: PCP Follow-up appointment confirmed?: Yes Date of PCP follow-up appointment?: 07/23/22 Follow-up Provider: Dr. Veto Kemps [facility] Follow-up appointment confirmed?: Yes Date of Specialist follow-up appointment?: 08/08/22 Follow-Up Specialty Provider:: Dr. Andrey Campanile- General Surgery Do you need transportation to your follow-up appointment?: No Do you understand care options if your condition(s) worsen?: Yes-patient verbalized understanding    SIGNATURE Agnes Lawrence, CMA (AAMA)  [facility]- [facility] [phone]

## 2022-07-19 NOTE — Discharge Summary (Signed)
Physician Discharge Summary  Jeremy Fox ZOX:[id number] DOB: 09/26/1945 DOA: 07/16/2022  PCP: Loyola Mast, MD  Admit date: 07/16/2022 Discharge date: 07/18/2022  Recommendations for Outpatient Follow-up:     Follow-up Information     Loyola Mast, MD. Schedule an appointment as soon as possible for a visit in 1 week(s).   Specialty: Family Medicine Why: F/U regarding blood pressure. HCTZ discontinued for hyponatremia Contact information: [address] [place] [address] [phone]         Gaynelle Adu, MD. Schedule an appointment as soon as possible for a visit in 3 week(s).   Specialty: General Surgery Why: For wound re-check Contact information: [address] [place] [address] [phone]                Discharge Diagnoses:  Incisional hernia s/p repair Hypertension Hyponatremia - improved  Surgical Procedure: LAPAROSCOPIC ASSISTED INCISIONAL HERNIA REPAIR WITH MESH with 4 X4 cm size LAPAROSCOPIC BILATERAL TAP BLOCK 07/16/22  Discharge Condition: good Disposition: home  Diet recommendation: cardiac  Filed Weights   07/16/22 0836  Weight: 108.4 kg    History of present illness:  Patient came in for planned repair of his incisional hernia.   Hospital Course:   He underwent laparoscopic assisted repair of incisional hernia with mesh.  He was kept overnight for observation and for pain control.  He did have some pain but he preferred not to have any oral opioid medication.  His pain was controlled with muscle relaxants, Tylenol.  On postoperative day 1 he was tolerating a diet however he did have a drop in his sodium level a repeat be met later that day revealed further decrease in the sodium level.  Tried hospitalist were consulted to help manage his hyponatremia.  His IV fluids were changed and his HCTZ was stopped.  On postop day 2 his sodium level had improved.  [facility] recommended stopping his HCTZ.  He was  sent out just on his lisinopril instead of his combination blood pressure medication.  He had been asked to get follow-up with his PCP in [redacted] week along with a bmet.  He also had some hypokalemia which was treated as well   Discharge Instructions  Discharge Instructions     Call MD for:   Complete by: As directed    Temperature >101   Call MD for:  hives   Complete by: As directed    Call MD for:  persistant dizziness or light-headedness   Complete by: As directed    Call MD for:  persistant nausea and vomiting   Complete by: As directed    Call MD for:  redness, tenderness, or signs of infection (pain, swelling, redness, odor or green/yellow discharge around incision site)   Complete by: As directed    Call MD for:  severe uncontrolled pain   Complete by: As directed    Diet - low sodium heart healthy   Complete by: As directed    Discharge instructions   Complete by: As directed    See [facility] discharge instructions   Increase activity slowly   Complete by: As directed       Allergies as of 07/18/2022       Reactions   Sulfa Antibiotics Rash   Sulfamethoxazole Rash        Medication List     STOP taking these medications    lisinopril-hydrochlorothiazide 20-25 MG tablet Commonly known as: ZESTORETIC  TAKE these medications    acetaminophen 500 MG tablet Commonly known as: TYLENOL Take 1,000 mg by mouth every 6 (six) hours as needed for mild pain.   aspirin EC 81 MG tablet Take 81 mg by mouth daily. Swallow whole.   finasteride 5 MG tablet Commonly known as: PROSCAR Take 5 mg by mouth daily.   ibuprofen 600 MG tablet Commonly known as: ADVIL Take 1 tablet (600 mg total) by mouth every 8 (eight) hours as needed for moderate pain (take with food; severe pain).   lisinopril 20 MG tablet Commonly known as: ZESTRIL Take 1 tablet (20 mg total) by mouth daily.   methocarbamol 750 MG tablet Commonly known as: Robaxin-750 Take 1 tablet (750 mg total) by  mouth every 6 (six) hours as needed for muscle spasms.   minoxidil 2.5 MG tablet Commonly known as: LONITEN Take 1 tablet (2.5 mg total) by mouth daily.        Follow-up Information     Loyola Mast, MD. Schedule an appointment as soon as possible for a visit in 1 week(s).   Specialty: Family Medicine Why: F/U regarding blood pressure. HCTZ discontinued for hyponatremia Contact information: [address] [place] [address] [phone]         Gaynelle Adu, MD. Schedule an appointment as soon as possible for a visit in 3 week(s).   Specialty: General Surgery Why: For wound re-check Contact information: [address] [place] [address] [phone]                  The results of significant diagnostics from this hospitalization (including imaging, microbiology, ancillary and laboratory) are listed below for reference.    Significant Diagnostic Studies: No results found.  Microbiology: No results found for this or any previous visit (from the past 240 hour(s)).   Labs: Basic Metabolic Panel: Recent Labs  Lab 07/17/22 0324 07/17/22 1124 07/17/22 1530 07/17/22 2200 07/18/22 0329  NA 129* 125* 126* 133* 133*  K 3.5 3.0* 3.0* 2.9* 3.4*  CL 93* 89* 92* 96* 97*  CO2 26 27 24 26 26   GLUCOSE 146* 111* 130* 114* 104*  BUN 16 16 18 22  24*  CREATININE 0.99 1.02 1.02 1.06 0.98  CALCIUM 7.8* 7.8* 7.8* 7.6* 7.9*  MG 1.9  --   --   --   --    Liver Function Tests: No results for input(s): "AST", "ALT", "ALKPHOS", "BILITOT", "PROT", "ALBUMIN" in the last 168 hours. No results for input(s): "LIPASE", "AMYLASE" in the last 168 hours. No results for input(s): "AMMONIA" in the last 168 hours. CBC: Recent Labs  Lab 07/13/22 0844 07/17/22 0324 07/18/22 0329  WBC 9.0 10.3 9.8  HGB 14.6 14.3 12.8*  HCT 42.3 40.5 37.4*  MCV 83.4 83.2 84.4  PLT 239 222 195   Cardiac Enzymes: No results for input(s): "CKTOTAL", "CKMB",  "CKMBINDEX", "TROPONINI" in the last 168 hours. BNP: BNP (last 3 results) No results for input(s): "BNP" in the last 8760 hours.  ProBNP (last 3 results) Recent Labs    11/10/21 0947  PROBNP 101    CBG: No results for input(s): "GLUCAP" in the last 168 hours.  Principal Problem:   S/P laparoscopic hernia repair Active Problems:   Hyponatremia   Time coordinating discharge: 15 min  Signed:  Atilano Ina, MD [facility] [phone] 07/19/2022, 9:13 AM

## 2022-07-19 NOTE — Anesthesia Postprocedure Evaluation (Signed)
Anesthesia Post Note  Patient: Jeremy Fox  Procedure(s) Performed: LAPAROSCOPIC INCISIONAL HERNIA REPAIR WITH MESH     Patient location during evaluation: PACU Anesthesia Type: General Level of consciousness: awake and alert, oriented and patient cooperative Pain management: pain level controlled Vital Signs Assessment: post-procedure vital signs reviewed and stable Respiratory status: spontaneous breathing, nonlabored ventilation and respiratory function stable Cardiovascular status: blood pressure returned to baseline and stable Postop Assessment: no apparent nausea or vomiting Anesthetic complications: no   No notable events documented.  Last Vitals:  Vitals:   07/18/22 0449 07/18/22 0544  BP: (!) 185/81 (!) 173/79  Pulse: 71   Resp: 18   Temp: (!) 36.3 C   SpO2: 97%     Last Pain:  Vitals:   07/18/22 1040  TempSrc:   PainSc: 0-No pain   Pain Goal: Patients Stated Pain Goal: 1 (07/17/22 0841)                 Lannie Fields

## 2022-07-20 ENCOUNTER — Encounter (HOSPITAL_COMMUNITY): Payer: Self-pay | Admitting: General Surgery

## 2022-07-23 ENCOUNTER — Telehealth: Payer: Self-pay | Admitting: Family Medicine

## 2022-07-23 ENCOUNTER — Encounter: Payer: Self-pay | Admitting: Family Medicine

## 2022-07-23 ENCOUNTER — Ambulatory Visit (INDEPENDENT_AMBULATORY_CARE_PROVIDER_SITE_OTHER): Payer: PPO | Admitting: Family Medicine

## 2022-07-23 VITALS — BP 188/94 | HR 89 | Temp 98.7°F | Ht 72.0 in | Wt 238.4 lb

## 2022-07-23 DIAGNOSIS — E876 Hypokalemia: Secondary | ICD-10-CM | POA: Insufficient documentation

## 2022-07-23 DIAGNOSIS — Z8719 Personal history of other diseases of the digestive system: Secondary | ICD-10-CM

## 2022-07-23 DIAGNOSIS — Z9889 Other specified postprocedural states: Secondary | ICD-10-CM | POA: Diagnosis not present

## 2022-07-23 DIAGNOSIS — I10 Essential (primary) hypertension: Secondary | ICD-10-CM

## 2022-07-23 DIAGNOSIS — E871 Hypo-osmolality and hyponatremia: Secondary | ICD-10-CM

## 2022-07-23 LAB — BASIC METABOLIC PANEL
BUN: 24 mg/dL — ABNORMAL HIGH (ref 6–23)
CO2: 30 mEq/L (ref 19–32)
Calcium: 8.6 mg/dL (ref 8.4–10.5)
Chloride: 104 mEq/L (ref 96–112)
Creatinine, Ser: 1.05 mg/dL (ref 0.40–1.50)
GFR: 67.63 mL/min (ref >60.00)
Glucose, Bld: 85 mg/dL (ref 70–99)
Potassium: 4.3 mEq/L (ref 3.5–5.1)
Sodium: 142 mEq/L (ref 135–145)

## 2022-07-23 MED ORDER — CARVEDILOL 3.125 MG PO TABS
3.1250 mg | ORAL_TABLET | Freq: Two times a day (BID) | ORAL | 3 refills | Status: DC
Start: 2022-07-23 — End: 2022-08-30

## 2022-07-23 MED ORDER — LISINOPRIL 20 MG PO TABS
20.0000 mg | ORAL_TABLET | Freq: Two times a day (BID) | ORAL | 3 refills | Status: DC
Start: 2022-07-23 — End: 2023-01-17

## 2022-07-23 MED ORDER — LISINOPRIL 20 MG PO TABS
20.0000 mg | ORAL_TABLET | Freq: Two times a day (BID) | ORAL | 2 refills | Status: DC
Start: 2022-07-23 — End: 2022-07-23

## 2022-07-23 NOTE — Assessment & Plan Note (Signed)
Mr. Jeremy Fox's BP is markedly high today. This would have been predictable in light of the significant reduction in his BP meds at discharge. I will have him increase his lisinopril 20 mg to twice a day. He has been intolerant of amlodipine, and now HCTZ. I will try adding carvedilol 3.125 mg bid to his regimen. He will continue minoxidil.

## 2022-07-23 NOTE — Assessment & Plan Note (Signed)
Repeat BMP today to see if this has resolved. 

## 2022-07-23 NOTE — Addendum Note (Signed)
Addended by: Loyola Mast on: 07/23/2022 11:51 AM   Modules accepted: Orders

## 2022-07-23 NOTE — Telephone Encounter (Signed)
[facility] [id number] - [place], [place] - [address]  Called stating the directions for this script does not match the quantity, unless it's for 15 days. He would like clarification.   lisinopril (ZESTRIL) 20 MG tablet [[id number]]   [facility] [id number] [place], [place] - [address] [place], [place] [address] Phone: [phone]  Fax: [phone]

## 2022-07-23 NOTE — Telephone Encounter (Signed)
Rx was resent with the correct number of tablets.  Dm/cma

## 2022-07-23 NOTE — Progress Notes (Signed)
[facility] [address] [place] [address] Dept: [phone] Dept Fax: [phone]  Hospital Follow-up Visit  Subjective:    Patient ID: Jeremy Fox, male    DOB: 10-26-1945, 77 y.o..   MRN: [id number]  Chief Complaint  Patient presents with   Hospitalization Follow-up    Hospital f/u after having hernia surgery 07/16/22.     History of Present Illness:  Patient is in today for follow-up from his recent hospitalization. He was admitted at [facility] from 5/10-5/02/2023 and underwent a laparoscopic incisional hernia repair with mesh. His preoperative labs showed a low sodium and low potassium. The hospitalist switched him from Zestoretic 20-25 mg bid to lisinopril 20 mg daily. He continues on minoxidil 2.5 mg daily. His sodium and potassium were improving at discharge, but had not normalized.  Jeremy Fox feels he is doing okay related to his surgery. He is still wearing an elastic abdominal binder for comfort.  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   Ventral hernia without obstruction or gangrene 06/01/2022   Dermal nevus of parietal region of scalp 06/01/2022   Sensorineural hearing loss (SNHL) of both ears 12/11/2021   Swelling of both lower extremities 11/10/2021   Urinary retention 04/05/2021   Benign prostatic hyperplasia 10/14/2020   Class 1 obesity due to excess calories with serious comorbidity and body mass index (BMI) of 31.0 to 31.9 in adult 09/18/2020   Onychomycosis of right great toe 06/07/2019   Prominent metatarsal head, right 06/07/2019   S/P arthroscopy of right shoulder 06/05/2019   Chronic pain of both knees 05/08/2019   Chronic right shoulder pain 05/08/2019   Elevated PSA 09/05/2018   Memory loss 09/05/2018   OSA (obstructive sleep  apnea) 03/29/2016   Essential hypertension 06/10/2015   Dyslipidemia 06/03/2015   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/03/2015   Rotator cuff syndrome of left shoulder 06/03/2015   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   Past Surgical History:  Procedure Laterality Date   CATARACT EXTRACTION W/ INTRAOCULAR LENS IMPLANT Bilateral    INCISIONAL HERNIA REPAIR N/A 07/16/2022   Procedure: LAPAROSCOPIC INCISIONAL HERNIA REPAIR WITH MESH;  Surgeon: Gaynelle Adu, MD;  Location: [facility];  Service: General;  Laterality: N/A;   REPLACEMENT TOTAL KNEE Bilateral    XI ROBOTIC ASSISTED SIMPLE PROSTATECTOMY N/A 06/26/2021   Procedure: XI ROBOTIC ASSISTED SIMPLE PROSTATECTOMY;  Surgeon: Sebastian Ache, MD;  Location: [facility];  Service: Urology;  Laterality: N/A;   Family History  Problem Relation Age of Onset   Heart attack Father    Heart disease Father    Diabetes Brother    Outpatient Medications Prior to Visit  Medication Sig Dispense Refill   acetaminophen (TYLENOL) 500 MG tablet Take 1,000 mg by mouth every 6 (six) hours as needed for mild pain.     aspirin EC 81 MG tablet Take 81 mg by mouth daily. Swallow whole.     finasteride (PROSCAR) 5 MG tablet Take 5 mg by mouth daily.     ibuprofen (ADVIL) 600 MG tablet Take 1 tablet (600 mg total) by mouth every 8 (eight) hours as needed for moderate pain (take with food; severe pain). 15 tablet 0   methocarbamol (ROBAXIN-750) 750 MG tablet Take 1 tablet (750 mg total) by mouth every 6 (six) hours as needed for muscle spasms.  30 tablet 0   minoxidil (LONITEN) 2.5 MG tablet Take 1 tablet (2.5 mg total) by mouth daily. 30 tablet 12   lisinopril (ZESTRIL) 20 MG tablet Take 1 tablet (20 mg total) by mouth daily. 30 tablet 2   No facility-administered medications prior to visit.   Allergies  Allergen Reactions   Sulfa Antibiotics Rash   Sulfamethoxazole Rash     Objective:   Today's Vitals   07/23/22 1048 07/23/22 1058  BP: (!) 200/90 (!)  188/94  Pulse: 89   Temp: 98.7 F (37.1 C)   TempSrc: Temporal   SpO2: 98%   Weight: 238 lb 6.4 oz (108.1 kg)   Height: 6' (1.829 m)    Body mass index is 32.33 kg/m.   General: Well developed, well nourished. No acute distress. Abdomen: Surgical sites are healing well without sign of infection. There appears to be a small, residual   bulge to the ventral abdominal wall  Psych: Alert and oriented. Normal mood and affect.  Health Maintenance Due  Topic Date Due   Medicare Annual Wellness (AWV)  06/02/2016   Lab Results    Latest Ref Rng & Units 07/18/2022    3:29 AM 07/17/2022   10:00 PM 07/17/2022    3:30 PM  CMP  Glucose 70 - 99 mg/dL 962  952  841   BUN 8 - 23 mg/dL 24  22  18    Creatinine 0.61 - 1.24 mg/dL 3.24  4.01  0.27   Sodium 135 - 145 mmol/L 133  133  126   Potassium 3.5 - 5.1 mmol/L 3.4  2.9  3.0   Chloride 98 - 111 mmol/L 97  96  92   CO2 22 - 32 mmol/L 26  26  24    Calcium 8.9 - 10.3 mg/dL 7.9  7.6  7.8    Assessment & Plan:   Problem List Items Addressed This Visit       Cardiovascular and Mediastinum   Essential hypertension - Primary    Jeremy Fox's BP is markedly high today. This would have been predictable in light of the significant reduction in his BP meds at discharge. I will have him increase his lisinopril 20 mg to twice a day. He has been intolerant of amlodipine, and now HCTZ. I will try adding carvedilol 3.125 mg bid to his regimen. He will continue minoxidil.      Relevant Medications   carvedilol (COREG) 3.125 MG tablet   lisinopril (ZESTRIL) 20 MG tablet     Other   S/P laparoscopic hernia repair    Appears to be healing well. He should follow-up with surgery as scheduled.      Hyponatremia    Repeat BMP today to see if this has resolved.      Relevant Orders   Basic metabolic panel   Hypokalemia    Repeat BMP today to see if this has resolved.      Relevant Orders   Basic metabolic panel    Return for Follow-up as  scheduled.   Loyola Mast, MD

## 2022-07-23 NOTE — Assessment & Plan Note (Signed)
Repeat BMP today to see if this has resolved.

## 2022-07-23 NOTE — Assessment & Plan Note (Signed)
Appears to be healing well. He should follow-up with surgery as scheduled.

## 2022-07-28 ENCOUNTER — Telehealth: Payer: Self-pay | Admitting: Family Medicine

## 2022-07-28 NOTE — Telephone Encounter (Signed)
Patient just wanted to let us know that his BP was now 145/77 since taking his medication twice daily.  Advised that it is better and to continue taking BP meds.  Dm/cma

## 2022-07-28 NOTE — Telephone Encounter (Signed)
Left VM to rtn call. Dm/cma       

## 2022-07-28 NOTE — Telephone Encounter (Signed)
Pt is wanting a cb, concerning his bp. It's elevated at 145/77. Please advise pt at [phone]

## 2022-08-09 ENCOUNTER — Encounter: Payer: Self-pay | Admitting: Internal Medicine

## 2022-08-09 ENCOUNTER — Telehealth: Payer: Self-pay | Admitting: Family Medicine

## 2022-08-09 ENCOUNTER — Ambulatory Visit (INDEPENDENT_AMBULATORY_CARE_PROVIDER_SITE_OTHER): Payer: PPO | Admitting: Internal Medicine

## 2022-08-09 VITALS — BP 160/90

## 2022-08-09 DIAGNOSIS — I10 Essential (primary) hypertension: Secondary | ICD-10-CM | POA: Diagnosis not present

## 2022-08-09 MED ORDER — SPIRONOLACTONE 25 MG PO TABS
25.0000 mg | ORAL_TABLET | Freq: Every day | ORAL | 0 refills | Status: DC
Start: 2022-08-09 — End: 2022-11-10

## 2022-08-09 NOTE — Telephone Encounter (Signed)
NT called and reported that the pt refused ED from her recommendation but will if Dr Veto Kemps tells him to. There are no appts open and he only wants to see Dr Veto Kemps.

## 2022-08-09 NOTE — Telephone Encounter (Signed)
Spoke to patient and he is okay with seeing Jeremy Fox today at 2:00 pm.  Dm/cma

## 2022-08-09 NOTE — Telephone Encounter (Signed)
Caller Name: Shritan Blatchford Ph #: [phone] Chief Complaint: BP 200/88 at 9:00 legs and feet very swollen.   This call was transferred to Nurse Triage/Access Nurse. This is for documentation purposes. No follow up required at this time.

## 2022-08-09 NOTE — Progress Notes (Signed)
[facility] [address] [place] [address] Dept: [phone] Dept Fax: [phone]  Acute Care Office Visit  Subjective:    Patient ID: Jeremy Fox, male    DOB: 03-15-1945, 77 y.o..   MRN: [id number]  Chief Complaint  Patient presents with   Hypertension    First B/p reading this morning 200/89  Last reading 166/82    History of Present Illness: Jeremy Fox is a 77 yo M who presents complaining of elevated BP onset this morning. Reports home BP of 200/89, then rechecked it and BP was 166/82. Reports hernia repair on May 10th, at that time his HCTZ was discontinued d/t hyponatremia and hypokalemia.  Recently saw his PCP Dr. Veto Kemps who increased his lisinopril to 20mg  BID and started Coreg 3.125mg  for BP management. Patient reports since discontinuing the HCTZ he has had leg swelling and BP remains elevated.  He is intolerant to Amlodipine.  Associated headache, leg swelling, fatigue.   Denies: CP, SHOB, palpitations, new motor or sensory deficits.    The following portions of the patient's history were reviewed and updated as appropriate: past medical history, past surgical history, family history, social history, allergies, medications, and problem list.   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   Ventral hernia without obstruction or gangrene 06/01/2022   Dermal nevus of parietal region of scalp 06/01/2022   Sensorineural hearing loss (SNHL) of both ears 12/11/2021   Swelling of both lower extremities 11/10/2021   Urinary retention 04/05/2021   Benign prostatic hyperplasia 10/14/2020   Class 1 obesity due to excess calories with serious comorbidity and body mass index (BMI) of 31.0 to 31.9 in adult 09/18/2020   Onychomycosis of right great  toe 06/07/2019   Prominent metatarsal head, right 06/07/2019   S/P arthroscopy of right shoulder 06/05/2019   Chronic pain of both knees 05/08/2019   Chronic right shoulder pain 05/08/2019   Elevated PSA 09/05/2018   Memory loss 09/05/2018   OSA (obstructive sleep apnea) 03/29/2016   Essential hypertension 06/10/2015   Dyslipidemia 06/03/2015   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/03/2015   Rotator cuff syndrome of left shoulder 06/03/2015   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   Past Medical History:  Diagnosis Date   Benign prostatic hyperplasia    Per records received from [facility], also in care everywhere   Dyslipidemia    Elevated PSA    Per records received from [facility], also in care everywhere   Essential hypertension    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/03/2015   OA (osteoarthritis) of knee 06/03/2015   OSA (obstructive sleep apnea)    Rotator cuff syndrome of left shoulder 06/03/2015   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   Past Surgical History:  Procedure Laterality Date   CATARACT EXTRACTION W/ INTRAOCULAR LENS IMPLANT Bilateral    INCISIONAL HERNIA REPAIR N/A 07/16/2022   Procedure: LAPAROSCOPIC INCISIONAL HERNIA REPAIR WITH MESH;  Surgeon: Gaynelle Adu, MD;  Location: [facility];  Service: General;  Laterality: N/A;   REPLACEMENT TOTAL KNEE Bilateral    XI ROBOTIC ASSISTED SIMPLE PROSTATECTOMY N/A 06/26/2021   Procedure: XI ROBOTIC ASSISTED SIMPLE PROSTATECTOMY;  Surgeon: Sebastian Ache, MD;  Location: [facility];  Service: Urology;  Laterality: N/A;   Family History  Problem Relation Age of Onset   Heart  attack Father    Heart disease Father    Diabetes Brother    Outpatient Medications Prior to Visit  Medication Sig Dispense Refill   acetaminophen (TYLENOL) 500 MG tablet Take 1,000 mg by mouth every 6 (six) hours as needed for mild pain.     aspirin EC 81 MG tablet Take 81 mg by mouth daily. Swallow whole.     carvedilol (COREG)  3.125 MG tablet Take 1 tablet (3.125 mg total) by mouth 2 (two) times daily with a meal. 60 tablet 3   finasteride (PROSCAR) 5 MG tablet Take 5 mg by mouth daily.     ibuprofen (ADVIL) 600 MG tablet Take 1 tablet (600 mg total) by mouth every 8 (eight) hours as needed for moderate pain (take with food; severe pain). 15 tablet 0   lisinopril (ZESTRIL) 20 MG tablet Take 1 tablet (20 mg total) by mouth in the morning and at bedtime. 180 tablet 3   minoxidil (LONITEN) 2.5 MG tablet Take 1 tablet (2.5 mg total) by mouth daily. 30 tablet 12   methocarbamol (ROBAXIN-750) 750 MG tablet Take 1 tablet (750 mg total) by mouth every 6 (six) hours as needed for muscle spasms. (Patient not taking: Reported on 08/09/2022) 30 tablet 0   No facility-administered medications prior to visit.   Allergies  Allergen Reactions   Sulfa Antibiotics Rash   Sulfamethoxazole Rash     ROS:  A complete ROS was performed with pertinent positives/negatives noted in the HPI. The remainder of the ROS are negative.    Objective:      08/09/2022    3:03 PM 08/09/2022    2:24 PM 07/23/2022   10:58 AM  Vitals with BMI  Systolic 160 170 161  Diastolic 90 88 94   GENERAL: Well-appearing, in NAD. Well nourished.  SKIN: Pink, warm and dry. No rash, lesion, ulceration, or ecchymoses.  NECK: Trachea midline. Full ROM w/o pain or tenderness. No lymphadenopathy.  RESPIRATORY: Chest wall symmetrical. Respirations even and non-labored. Breath sounds clear to auscultation bilaterally.  CARDIAC: S1, S2 present, regular rate and rhythm. Peripheral pulses 2+ bilaterally.  EXTREMITIES: Without clubbing, cyanosis. 2+ edema BLE to mid-shins NEUROLOGIC: No motor or sensory deficits. Steady, even gait.  PSYCH/MENTAL STATUS: Alert, oriented x 3. Cooperative, appropriate mood and affect.   EKG tracing is personally reviewed.  EKG:  normal EKG, normal sinus rhythm, unchanged from previous tracings  Lab Results No results found for any  visits on 08/09/22.    Assessment & Plan:  1. Essential hypertension - EKG 12-Lead - spironolactone (ALDACTONE) 25 MG tablet; Take 1 tablet (25 mg total) by mouth daily.  Dispense: 90 tablet; Refill: 0 - continue Lisionpril 20mg  BID and Coreg 3.125mg  BID - patient instructed to follow up in 2 weeks for BP recheck and labs, he has appt already scheduled with Dr. Veto Kemps on June 26th.  - check BP at home and write down. Bring readings to next office appt.    Return for routine office visits previously scheduled .  Pt has upcoming appt on June 26th for routine follow up.   Salvatore Decent, FNP

## 2022-08-30 ENCOUNTER — Other Ambulatory Visit: Payer: Self-pay | Admitting: Family Medicine

## 2022-08-30 DIAGNOSIS — I10 Essential (primary) hypertension: Secondary | ICD-10-CM

## 2022-09-01 ENCOUNTER — Ambulatory Visit (INDEPENDENT_AMBULATORY_CARE_PROVIDER_SITE_OTHER): Payer: PPO | Admitting: Family Medicine

## 2022-09-01 ENCOUNTER — Encounter: Payer: Self-pay | Admitting: Family Medicine

## 2022-09-01 VITALS — BP 164/82 | HR 68 | Temp 98.4°F | Ht 72.0 in | Wt 236.4 lb

## 2022-09-01 DIAGNOSIS — M7989 Other specified soft tissue disorders: Secondary | ICD-10-CM | POA: Diagnosis not present

## 2022-09-01 DIAGNOSIS — I1A0 Resistant hypertension: Secondary | ICD-10-CM | POA: Diagnosis not present

## 2022-09-01 MED ORDER — CARVEDILOL 6.25 MG PO TABS
6.2500 mg | ORAL_TABLET | Freq: Two times a day (BID) | ORAL | 3 refills | Status: DC
Start: 2022-09-01 — End: 2022-09-29

## 2022-09-01 NOTE — Progress Notes (Signed)
[facility] [address] [place] [address] Dept: [phone] Dept Fax: [phone]  Chronic Care Office Visit  Subjective:    Patient ID: Jeremy Fox, male    DOB: Mar 26, 1945, 77 y.o..   MRN: [id number]  Chief Complaint  Patient presents with   Medical Management of Chronic Issues    3 month f/u.   Still having elevated BP.     History of Present Illness:  Patient is in today for reassessment of chronic medical issues.  Jeremy Fox has a history of hypertension. He was admitted at [facility] from 5/10-5/02/2023 and underwent a laparoscopic incisional hernia repair with mesh. His preoperative labs showed a low sodium and low potassium. The hospitalist switched him from Zestoretic 20-25 mg bid to lisinopril 20 mg daily. He continued on minoxidil 2.5 mg daily. His sodium and potassium have normalized since. However, his blood pressure control has been worse since then. He had not been able to tolerate amlodipine int he past. I had increased his lisinopril to 20 mg bid (40 mg daily) and added carvedilol 3.125 mg twice daily. He saw Ms. Myers on 6/3 with continued elevated pressures. He had also noted increased swelling in his lower legs. She added spironolactone 25 mg daily. Jeremy Fox is still noting elevated pressures at home despite these changes.  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   Ventral hernia without obstruction or gangrene 06/01/2022   Dermal nevus of parietal region of scalp 06/01/2022   Sensorineural hearing loss (SNHL) of both ears 12/11/2021   Swelling of both lower extremities 11/10/2021   Urinary retention 04/05/2021   Benign prostatic hyperplasia 10/14/2020   Class 1 obesity due to excess calories with serious  comorbidity and body mass index (BMI) of 31.0 to 31.9 in adult 09/18/2020   Onychomycosis of right great toe 06/07/2019   Prominent metatarsal head, right 06/07/2019   S/P arthroscopy of right shoulder 06/05/2019   Chronic pain of both knees 05/08/2019   Chronic right shoulder pain 05/08/2019   Elevated PSA 09/05/2018   Memory loss 09/05/2018   OSA (obstructive sleep apnea) 03/29/2016   Essential hypertension 06/10/2015   Dyslipidemia 06/03/2015   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/03/2015   Rotator cuff syndrome of left shoulder 06/03/2015   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   Past Surgical History:  Procedure Laterality Date   CATARACT EXTRACTION W/ INTRAOCULAR LENS IMPLANT Bilateral    INCISIONAL HERNIA REPAIR N/A 07/16/2022   Procedure: LAPAROSCOPIC INCISIONAL HERNIA REPAIR WITH MESH;  Surgeon: Gaynelle Adu, MD;  Location: [facility];  Service: General;  Laterality: N/A;   REPLACEMENT TOTAL KNEE Bilateral    XI ROBOTIC ASSISTED SIMPLE PROSTATECTOMY N/A 06/26/2021   Procedure: XI ROBOTIC ASSISTED SIMPLE PROSTATECTOMY;  Surgeon: Sebastian Ache, MD;  Location: [facility];  Service: Urology;  Laterality: N/A;   Family History  Problem Relation Age of Onset   Heart attack Father    Heart disease Father    Diabetes Brother    Outpatient Medications Prior to Visit  Medication Sig Dispense Refill   acetaminophen (TYLENOL) 500 MG tablet Take 1,000 mg by mouth every 6 (six) hours as needed for mild pain.     aspirin EC 81 MG tablet Take 81 mg by mouth daily. Swallow whole.  finasteride (PROSCAR) 5 MG tablet Take 5 mg by mouth daily.     ibuprofen (ADVIL) 600 MG tablet Take 1 tablet (600 mg total) by mouth every 8 (eight) hours as needed for moderate pain (take with food; severe pain). 15 tablet 0   lisinopril (ZESTRIL) 20 MG tablet Take 1 tablet (20 mg total) by mouth in the morning and at bedtime. 180 tablet 3   methocarbamol (ROBAXIN-750) 750 MG tablet Take 1 tablet (750 mg  total) by mouth every 6 (six) hours as needed for muscle spasms. 30 tablet 0   minoxidil (LONITEN) 2.5 MG tablet Take 1 tablet (2.5 mg total) by mouth daily. 30 tablet 12   spironolactone (ALDACTONE) 25 MG tablet Take 1 tablet (25 mg total) by mouth daily. 90 tablet 0   carvedilol (COREG) 3.125 MG tablet TAKE 1 TABLET BY MOUTH TWICE A DAY WITH A MEAL 180 tablet 3   No facility-administered medications prior to visit.   Allergies  Allergen Reactions   Sulfa Antibiotics Rash   Sulfamethoxazole Rash   Objective:   Today's Vitals   09/01/22 0759  BP: (!) 156/82  Pulse: 68  Temp: 98.4 F (36.9 C)  TempSrc: Temporal  SpO2: 98%  Weight: 236 lb 6.4 oz (107.2 kg)  Height: 6' (1.829 m)   Body mass index is 32.06 kg/m.   General: Well developed, well nourished. No acute distress. Extremities: No edema of lower legs. Psych: Alert and oriented. Normal mood and affect.  There are no preventive care reminders to display for this patient.  Lab Results    Latest Ref Rng & Units 07/23/2022   11:25 AM 07/18/2022    3:29 AM 07/17/2022   10:00 PM  BMP  Glucose 70 - 99 mg/dL 85  621  308   BUN 6 - 23 mg/dL 24  24  22    Creatinine 0.40 - 1.50 mg/dL 6.57  8.46  9.62   Sodium 135 - 145 mEq/L 142  133  133   Potassium 3.5 - 5.1 mEq/L 4.3  3.4  2.9   Chloride 96 - 112 mEq/L 104  97  96   CO2 19 - 32 mEq/L 30  26  26    Calcium 8.4 - 10.5 mg/dL 8.6  7.9  7.6      Assessment & Plan:   Problem List Items Addressed This Visit       Cardiovascular and Mediastinum   Essential hypertension   Relevant Medications   carvedilol (COREG) 6.25 MG tablet   Other Relevant Orders   AMB REFERRAL TO [facility]    Return in about 4 weeks (around 09/29/2022) for Reassessment.   Loyola Mast, MD

## 2022-09-01 NOTE — Assessment & Plan Note (Signed)
Blood pressure remains high. He will continue lisinopril 20 mg bid, minoxidil 2.5 mg daily, and spironolactone 25 mg daily. I will increase his carvedilol to 6.25 mg bid. I will refer Jeremy Fox to the [facility].

## 2022-09-01 NOTE — Patient Instructions (Signed)
Take 2 tablets of carvedilol 3.125 mg twice a day, until current prescription used up.  With new prescription, you will take 1 tablet of 6.25 mg twice a day

## 2022-09-01 NOTE — Assessment & Plan Note (Signed)
Improved. We discussed that leg swelling could be a side effect of his minoxidil. This medication was started by a previous physician. The very low dose he is on is likely not very effective. I would consider stopping this medicine, but will await the assessment of the [facility].

## 2022-09-29 ENCOUNTER — Ambulatory Visit (INDEPENDENT_AMBULATORY_CARE_PROVIDER_SITE_OTHER): Payer: PPO | Admitting: Family Medicine

## 2022-09-29 ENCOUNTER — Encounter: Payer: Self-pay | Admitting: Family Medicine

## 2022-09-29 VITALS — BP 160/76 | HR 60 | Temp 98.0°F | Ht 72.0 in | Wt 242.0 lb

## 2022-09-29 DIAGNOSIS — M25561 Pain in right knee: Secondary | ICD-10-CM | POA: Diagnosis not present

## 2022-09-29 DIAGNOSIS — Z96659 Presence of unspecified artificial knee joint: Secondary | ICD-10-CM

## 2022-09-29 DIAGNOSIS — M25562 Pain in left knee: Secondary | ICD-10-CM | POA: Diagnosis not present

## 2022-09-29 DIAGNOSIS — I1A0 Resistant hypertension: Secondary | ICD-10-CM

## 2022-09-29 DIAGNOSIS — G8929 Other chronic pain: Secondary | ICD-10-CM | POA: Diagnosis not present

## 2022-09-29 MED ORDER — LABETALOL HCL 100 MG PO TABS
100.0000 mg | ORAL_TABLET | Freq: Two times a day (BID) | ORAL | 3 refills | Status: DC
Start: 2022-09-29 — End: 2022-10-07

## 2022-09-29 NOTE — Progress Notes (Signed)
[facility] [address] [place] [address] Dept: [phone] Dept Fax: [phone]  Chronic Care Office Visit  Subjective:    Patient ID: Jeremy Fox, male    DOB: [birth year], 77 y.o..   MRN: [id number]  Chief Complaint  Patient presents with   Medical Management of Chronic Issues    4 week f/u HTN.  Average BP 192/89- 161/77.  C/o having pain in both lower legs.     History of Present Illness:  Patient is in today for reassessment of chronic medical issues.  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 He was admitted at [facility] from 5/10-5/02/2023 and underwent a laparoscopic incisional hernia repair with mesh. His preoperative labs showed a low sodium and low potassium. The hospitalist switched him from Zestoretic 20-25 mg bid to lisinopril 20 mg daily. He continued on minoxidil 2.5 mg daily. His sodium and potassium have normalized since. However, his blood pressure control has been worse since then. He had not been able to tolerate amlodipine in the past. He is currently managed on lisinopril 20 mg bid (40 mg daily), carvedilol 6.25 mg twice daily, minoxidil 2.5 mg daily and spironolactone 25 mg daily. Jeremy Fox's home blood pressure log shows a 7-day average of  179/81.  Jeremy Fox has a history of prior bilateral total knee joint replacements. He notes his knees have been hurting him more. He has been taking Bayer Back and Body Extra strength (500 mg aspirin/32.5 mg caffeine) 4 times a day. He also admits to other caffeine intake through drinking colas.  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   Ventral hernia without obstruction or gangrene 06/01/2022   Dermal nevus of parietal region of scalp 06/01/2022   Sensorineural hearing loss (SNHL) of both ears 12/11/2021    Swelling of both lower extremities 11/10/2021   Urinary retention 04/05/2021   Benign prostatic hyperplasia 10/14/2020   Class 1 obesity due to excess calories with serious comorbidity and body mass index (BMI) of 31.0 to 31.9 in adult 09/18/2020   Onychomycosis of right great toe 06/07/2019   Prominent metatarsal head, right 06/07/2019   S/P arthroscopy of right shoulder 06/05/2019   Chronic pain of both knees 05/08/2019   Chronic right shoulder pain 05/08/2019   Elevated PSA 09/05/2018   Memory loss 09/05/2018   OSA (obstructive sleep apnea) 03/29/2016   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   Dyslipidemia 06/03/2015   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/03/2015   Rotator cuff syndrome of left shoulder 06/03/2015   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   Past Surgical History:  Procedure Laterality Date   CATARACT EXTRACTION W/ INTRAOCULAR LENS IMPLANT Bilateral    INCISIONAL HERNIA REPAIR N/A 07/16/2022   Procedure: LAPAROSCOPIC INCISIONAL HERNIA REPAIR WITH MESH;  Surgeon: Gaynelle Adu, MD;  Location: [facility];  Service: General;  Laterality: N/A;   REPLACEMENT TOTAL KNEE Bilateral    XI ROBOTIC ASSISTED SIMPLE PROSTATECTOMY N/A 06/26/2021   Procedure: XI ROBOTIC ASSISTED SIMPLE PROSTATECTOMY;  Surgeon: Sebastian Ache, MD;  Location: [facility];  Service: Urology;  Laterality: N/A;   Family History  Problem Relation Age of Onset   Heart attack Father    Heart disease Father    Diabetes Brother    Outpatient Medications Prior to Visit  Medication Sig Dispense Refill   acetaminophen (TYLENOL) 500 MG tablet Take  1,000 mg by mouth every 6 (six) hours as needed for mild pain.     aspirin EC 81 MG tablet Take 81 mg by mouth daily. Swallow whole.     finasteride (PROSCAR) 5 MG tablet Take 5 mg by mouth daily.     ibuprofen (ADVIL) 600 MG tablet Take 1 tablet (600 mg total) by mouth every 8 (eight) hours as needed for moderate pain (take with food; severe pain). 15 tablet 0    lisinopril (ZESTRIL) 20 MG tablet Take 1 tablet (20 mg total) by mouth in the morning and at bedtime. 180 tablet 3   methocarbamol (ROBAXIN-750) 750 MG tablet Take 1 tablet (750 mg total) by mouth every 6 (six) hours as needed for muscle spasms. 30 tablet 0   minoxidil (LONITEN) 2.5 MG tablet Take 1 tablet (2.5 mg total) by mouth daily. 30 tablet 12   spironolactone (ALDACTONE) 25 MG tablet Take 1 tablet (25 mg total) by mouth daily. 90 tablet 0   carvedilol (COREG) 6.25 MG tablet Take 1 tablet (6.25 mg total) by mouth 2 (two) times daily with a meal. 60 tablet 3   No facility-administered medications prior to visit.   Allergies  Allergen Reactions   Sulfa Antibiotics Rash   Sulfamethoxazole Rash   Objective:   Today's Vitals   09/29/22 0822  BP: (!) 160/76  Pulse: 60  Temp: 98 F (36.7 C)  TempSrc: Temporal  SpO2: 99%  Weight: 242 lb (109.8 kg)  Height: 6' (1.829 m)   Body mass index is 32.82 kg/m.   General: Well developed, well nourished. No acute distress. Psych: Alert and oriented. Normal mood and affect.  There are no preventive care reminders to display for this patient.    Assessment & Plan:   Problem List Items Addressed This Visit       Cardiovascular and Mediastinum   Resistant hypertension - Primary    Blood pressure remains high. Continue lisinopril 20 mg bid, minoxidil 2.5 mg daily, and spironolactone 25 mg daily. I will switch him from carvedilol to labetalol 100 mg bid. He can increase this to 200 mg bid after 1 week if not at goal of systolic BP < 140. I also recommended he stop or reduce his use of the Bayer product due to the caffeine. Appointment in [facility] is pending for 11/16/2022.      Relevant Medications   labetalol (NORMODYNE) 100 MG tablet     Other   Chronic pain of both knees    I will refer Jeremy Fox back to orthopedics to discuss his knee pain issues. Hopefully, he can decrease use of other meds that may be  interfering with his BP control.      Relevant Orders   Ambulatory referral to Orthopedics   Other Visit Diagnoses     Status post replacement of knee joint- bilateral       As above   Relevant Orders   Ambulatory referral to Orthopedics       Return in about 4 weeks (around 10/27/2022) for Reassessment.   Loyola Mast, MD

## 2022-09-29 NOTE — Patient Instructions (Signed)
Stop carvedilol. Start labetalol 100 mg 1 (one) tablet twice a day. If after one week the systolic blood pressure remains > 140 mmHg, increase labetalol to 2 (two) tablets twice a day. If you do increase your labetalol dose, please let me know.

## 2022-09-29 NOTE — Assessment & Plan Note (Addendum)
Blood pressure remains high. Continue lisinopril 20 mg bid, minoxidil 2.5 mg daily, and spironolactone 25 mg daily. I will switch him from carvedilol to labetalol 100 mg bid. He can increase this to 200 mg bid after 1 week if not at goal of systolic BP < 140. I also recommended he stop or reduce his use of the Bayer product due to the caffeine. Appointment in [facility] is pending for 11/16/2022.

## 2022-09-29 NOTE — Assessment & Plan Note (Signed)
I will refer Mr. Murnane back to orthopedics to discuss his knee pain issues. Hopefully, he can decrease use of other meds that may be interfering with his BP control.

## 2022-10-06 ENCOUNTER — Encounter (INDEPENDENT_AMBULATORY_CARE_PROVIDER_SITE_OTHER): Payer: Self-pay

## 2022-10-06 ENCOUNTER — Telehealth: Payer: Self-pay | Admitting: Family Medicine

## 2022-10-06 DIAGNOSIS — I1A0 Resistant hypertension: Secondary | ICD-10-CM

## 2022-10-06 NOTE — Telephone Encounter (Signed)
Pt called in asking to know if his dosage for medication labetalol (NORMODYNE) 100 MG tablet [[id number]] can be increased to 2 tablets (100 mg total) by mouth 2 (two) times daily. Pls advice pt on [phone] .

## 2022-10-07 MED ORDER — LABETALOL HCL 200 MG PO TABS
200.0000 mg | ORAL_TABLET | Freq: Two times a day (BID) | ORAL | Status: DC
Start: 2022-10-07 — End: 2022-12-16

## 2022-10-07 NOTE — Telephone Encounter (Signed)
Patient has start to take the Labetalol 100 mg to 2 tablets twice daily.   He will keep a log of his BP to bring with him at his next OV.  Dm/cma

## 2022-10-27 ENCOUNTER — Ambulatory Visit (INDEPENDENT_AMBULATORY_CARE_PROVIDER_SITE_OTHER): Payer: PPO | Admitting: Family Medicine

## 2022-10-27 ENCOUNTER — Encounter: Payer: Self-pay | Admitting: Family Medicine

## 2022-10-27 VITALS — BP 160/82 | HR 69 | Temp 92.2°F | Resp 16 | Ht 72.0 in | Wt 244.8 lb

## 2022-10-27 DIAGNOSIS — M25561 Pain in right knee: Secondary | ICD-10-CM

## 2022-10-27 DIAGNOSIS — M25562 Pain in left knee: Secondary | ICD-10-CM | POA: Diagnosis not present

## 2022-10-27 DIAGNOSIS — I1A0 Resistant hypertension: Secondary | ICD-10-CM

## 2022-10-27 DIAGNOSIS — G8929 Other chronic pain: Secondary | ICD-10-CM | POA: Diagnosis not present

## 2022-10-27 NOTE — Assessment & Plan Note (Signed)
Orthopedic visit pending. It is reassuring that his pain is starting to improve.

## 2022-10-27 NOTE — Progress Notes (Signed)
[facility] [address] [place] [address] Dept: [phone] Dept Fax: [phone]  Chronic Care Office Visit  Subjective:    Patient ID: Jeremy Fox, male    DOB: 06-Dec-1945, 77 y.o..   MRN: [id number]  Chief Complaint  Patient presents with   Hypertension    4 week f/u  Bought BP log    History of Present Illness:  Patient is in today for reassessment of chronic medical issues.  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 He was admitted at [facility] from 5/10-5/02/2023 and underwent a laparoscopic incisional hernia repair with mesh. His preoperative labs showed a low sodium and low potassium. The hospitalist switched him from Zestoretic 20-25 mg bid to lisinopril 20 mg daily. He continued on minoxidil 2.5 mg daily. His sodium and potassium have normalized since. However, his blood pressure control has been worse since then. He had not been able to tolerate amlodipine in the past. He is currently managed on lisinopril 20 mg bid (40 mg daily), minoxidil 2.5 mg daily, spironolactone 25 mg daily. At his last visit, we switched him from carvedilol 6.25 mg twice daily to labetalol 200 mg twice daily. Mr. Terrio's home blood pressure log shows a 7-day average of  159/70..   Mr. Hsiung has a history of prior bilateral total knee joint replacements. He has had some ongoing knee pain. He notes since his last visit, his knee pain is less now and he is able to be more active. He feels his pain has influenced his BP.  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   Ventral hernia without obstruction or gangrene 06/01/2022   Dermal nevus of parietal region of scalp 06/01/2022   Sensorineural hearing loss (SNHL) of both ears 12/11/2021   Swelling of both lower extremities 11/10/2021   Urinary  retention 04/05/2021   Benign prostatic hyperplasia 10/14/2020   Class 1 obesity due to excess calories with serious comorbidity and body mass index (BMI) of 31.0 to 31.9 in adult 09/18/2020   Onychomycosis of right great toe 06/07/2019   Prominent metatarsal head, right 06/07/2019   S/P arthroscopy of right shoulder 06/05/2019   Chronic pain of both knees 05/08/2019   Chronic right shoulder pain 05/08/2019   Elevated PSA 09/05/2018   Memory loss 09/05/2018   OSA (obstructive sleep apnea) 03/29/2016   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   Dyslipidemia 06/03/2015   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/03/2015   Rotator cuff syndrome of left shoulder 06/03/2015   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   Past Surgical History:  Procedure Laterality Date   CATARACT EXTRACTION W/ INTRAOCULAR LENS IMPLANT Bilateral    INCISIONAL HERNIA REPAIR N/A 07/16/2022   Procedure: LAPAROSCOPIC INCISIONAL HERNIA REPAIR WITH MESH;  Surgeon: Gaynelle Adu, MD;  Location: [facility];  Service: General;  Laterality: N/A;   REPLACEMENT TOTAL KNEE Bilateral    XI ROBOTIC ASSISTED SIMPLE PROSTATECTOMY N/A 06/26/2021   Procedure: XI ROBOTIC ASSISTED SIMPLE PROSTATECTOMY;  Surgeon: Sebastian Ache, MD;  Location: [facility];  Service: Urology;  Laterality: N/A;   Family History  Problem Relation Age of Onset   Heart attack Father    Heart disease Father    Diabetes Brother    Outpatient Medications Prior to Visit  Medication Sig Dispense Refill   acetaminophen (TYLENOL) 500 MG tablet Take 1,000 mg by mouth  every 6 (six) hours as needed for mild pain.     aspirin EC 81 MG tablet Take 81 mg by mouth daily. Swallow whole.     finasteride (PROSCAR) 5 MG tablet Take 5 mg by mouth daily.     ibuprofen (ADVIL) 600 MG tablet Take 1 tablet (600 mg total) by mouth every 8 (eight) hours as needed for moderate pain (take with food; severe pain). 15 tablet 0   labetalol (NORMODYNE) 200 MG tablet Take 1 tablet (200 mg  total) by mouth 2 (two) times daily.     lisinopril (ZESTRIL) 20 MG tablet Take 1 tablet (20 mg total) by mouth in the morning and at bedtime. 180 tablet 3   methocarbamol (ROBAXIN-750) 750 MG tablet Take 1 tablet (750 mg total) by mouth every 6 (six) hours as needed for muscle spasms. 30 tablet 0   minoxidil (LONITEN) 2.5 MG tablet Take 1 tablet (2.5 mg total) by mouth daily. 30 tablet 12   spironolactone (ALDACTONE) 25 MG tablet Take 1 tablet (25 mg total) by mouth daily. 90 tablet 0   No facility-administered medications prior to visit.   Allergies  Allergen Reactions   Sulfa Antibiotics Rash   Sulfamethoxazole Rash   Objective:   Today's Vitals   10/27/22 0909  BP: (!) 160/82  Pulse: 69  Resp: 16  Temp: (!) 92.2 F (33.4 C)  TempSrc: Temporal  SpO2: 96%  Weight: 244 lb 12.8 oz (111 kg)  Height: 6' (1.829 m)   Body mass index is 33.2 kg/m.   General: Well developed, well nourished. No acute distress. Psych: Alert and oriented. Normal mood and affect.  Health Maintenance Due  Topic Date Due   INFLUENZA VACCINE  10/07/2022   Assessment & Plan:   Problem List Items Addressed This Visit       Cardiovascular and Mediastinum   Resistant hypertension - Primary    Blood pressure remains high, but is down about 20 ponts systolic and 10 points diastolic since his last visit. Continue lisinopril 20 mg bid, minoxidil 2.5 mg daily, spironolactone 25 mg daily and labetalol 200 mg bid. Appointment in [facility] is pending for 11/16/2022.        Other   Chronic pain of both knees    Orthopedic visit pending. It is reassuring that his pain is starting to improve.       Return in about 6 weeks (around 12/08/2022) for Reassessment.   Loyola Mast, MD

## 2022-10-27 NOTE — Assessment & Plan Note (Signed)
Blood pressure remains high, but is down about 20 ponts systolic and 10 points diastolic since his last visit. Continue lisinopril 20 mg bid, minoxidil 2.5 mg daily, spironolactone 25 mg daily and labetalol 200 mg bid. Appointment in [facility] is pending for 11/16/2022.

## 2022-11-10 ENCOUNTER — Other Ambulatory Visit: Payer: Self-pay

## 2022-11-10 DIAGNOSIS — I10 Essential (primary) hypertension: Secondary | ICD-10-CM

## 2022-11-10 MED ORDER — SPIRONOLACTONE 25 MG PO TABS
25.0000 mg | ORAL_TABLET | Freq: Every day | ORAL | 3 refills | Status: DC
Start: 2022-11-10 — End: 2022-11-16

## 2022-11-10 NOTE — Telephone Encounter (Signed)
Patient  requesting Rx refill

## 2022-11-16 ENCOUNTER — Ambulatory Visit (HOSPITAL_BASED_OUTPATIENT_CLINIC_OR_DEPARTMENT_OTHER): Payer: PPO | Admitting: Cardiovascular Disease

## 2022-11-16 ENCOUNTER — Encounter (HOSPITAL_BASED_OUTPATIENT_CLINIC_OR_DEPARTMENT_OTHER): Payer: Self-pay | Admitting: Cardiovascular Disease

## 2022-11-16 ENCOUNTER — Other Ambulatory Visit: Payer: Self-pay | Admitting: Family Medicine

## 2022-11-16 VITALS — BP 186/70 | HR 61 | Ht 72.0 in | Wt 248.5 lb

## 2022-11-16 DIAGNOSIS — I1A0 Resistant hypertension: Secondary | ICD-10-CM

## 2022-11-16 DIAGNOSIS — Z5181 Encounter for therapeutic drug level monitoring: Secondary | ICD-10-CM

## 2022-11-16 DIAGNOSIS — I10 Essential (primary) hypertension: Secondary | ICD-10-CM | POA: Diagnosis not present

## 2022-11-16 DIAGNOSIS — Z006 Encounter for examination for normal comparison and control in clinical research program: Secondary | ICD-10-CM

## 2022-11-16 MED ORDER — SPIRONOLACTONE 50 MG PO TABS: 50.0000 mg | ORAL_TABLET | Freq: Every day | ORAL | 3 refills | Status: DC

## 2022-11-16 NOTE — Progress Notes (Signed)
[facility] Initial Assessment:    Date:  11/16/2022   ID:  Jeremy Fox, DOB April 07, 1945, MRN [id number]  PCP:  Loyola Mast, MD  Cardiologist:  Gypsy Balsam, MD  Nephrologist:  Referring MD: Loyola Mast, MD   CC: Hypertension  History of Present Illness:    Jeremy Fox is a 77 y.o. male with a hx of hypertension, hyperlipidemia, OSA, here to establish care in the [facility]. He last saw Dr. Bing Matter 11/2021 and blood pressure was 134/86. He had some LE edema and amlodipine was thought to be contributing. He was admitted for hernia repair 07/2022. He was switched from lisinopril/HCTZ to just lisinopril daily due to low potassium. He remained on minoxidil and spironolactone. His PCP switched him from carvedilol to labetalol and blood pressures remained elevated, so he was referred to the [facility].   Today, he is accompanied by a family member. In the office his blood pressure is 186/78 initially. On manual recheck it is 184/78 in the right arm, and 186/70 in the left arm. Of note, he has been out of spironolactone for 1-2 weeks. We reviewed his uploaded blood pressure logs, with ranges from 140-180/60-80. He confirms average readings at home in the 170's-180's. He has had four surgeries in the past 2 years, limiting his activity. However, in the last month he had been feeling better and worked hard around the house and heavy yard work. Yesterday he assisted with the removal of a tree trunk. He will go to the gym when he isn't being active. No anginal symptoms and no breathing issues. He uses his CPAP every night. His wife may use a pinch of salt when cooking, but otherwise he isn't adding any salt. Enjoys tomato sandwiches; he has now cut back to using 1 slice of bread. Usually drinks water or soda. No coffee or tea. Alcohol consumption is limited to a little bit of wine occasionally. Previously he was taking 1-2 tablets of  higher strength Bayer aspirin daily. In the past month he has not needed to take pain medications. He denies any palpitations, chest pain, shortness of breath, peripheral edema, lightheadedness, headaches, syncope, orthopnea, or PND.  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    Past Surgical History:  Procedure Laterality Date   CATARACT EXTRACTION W/ INTRAOCULAR LENS IMPLANT Bilateral    INCISIONAL HERNIA REPAIR N/A 07/16/2022   Procedure: LAPAROSCOPIC INCISIONAL HERNIA REPAIR WITH MESH;  Surgeon: Gaynelle Adu, MD;  Location: [facility];  Service: General;  Laterality: N/A;   REPLACEMENT TOTAL KNEE Bilateral    XI ROBOTIC ASSISTED SIMPLE PROSTATECTOMY N/A 06/26/2021   Procedure: XI ROBOTIC ASSISTED SIMPLE PROSTATECTOMY;  Surgeon: Sebastian Ache, MD;  Location: [facility];  Service: Urology;  Laterality: N/A;    Current Medications: Current Meds  Medication Sig   acetaminophen (TYLENOL) 500 MG tablet Take 1,000 mg by mouth every 6 (six) hours as needed for mild pain.   finasteride (PROSCAR)  5 MG tablet Take 5 mg by mouth daily.   labetalol (NORMODYNE) 200 MG tablet Take 1 tablet (200 mg total) by mouth 2 (two) times daily.   lisinopril (ZESTRIL) 20 MG tablet Take 1 tablet (20 mg total) by mouth in the morning and at bedtime.   minoxidil (LONITEN) 2.5 MG tablet Take 1 tablet (2.5 mg total) by mouth daily.   [DISCONTINUED] methocarbamol (ROBAXIN-750) 750 MG tablet Take 1  tablet (750 mg total) by mouth every 6 (six) hours as needed for muscle spasms.     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[employer]  Tobacco Use   Smoking status: Never    Passive exposure: Never   Smokeless tobacco: Never  Vaping Use   Vaping status: Never Used  Substance and Sexual Activity   Alcohol use: Never   Drug use: No   Sexual activity: Yes  Other Topics Concern   Not on file  Social History Narrative   Not on file   Social Determinants of Health   Financial Resource Strain: Not on file  Food Insecurity: No Food Insecurity (07/16/2022)   Hunger Vital Sign    Worried About Running Out of Food in the Last Year: Never true    Ran Out of Food in the Last Year: Never true  Transportation Needs: No Transportation Needs (07/16/2022)   PRAPARE - Administrator, Civil Service (Medical): No    Lack of Transportation (Non-Medical): No  Physical Activity: Inactive (11/16/2022)   Exercise Vital Sign    Days of Exercise per Week: 0 days    Minutes of Exercise per Session: 0 min  Stress: Not on file  Social Connections: Not on file     Family History: The patient's family history includes Diabetes in his brother; Heart attack (age of onset: 52) in his father; Heart disease in his father.  ROS:   Please see the history of present illness. All other systems reviewed and are negative.  EKGs/Labs/Other Studies Reviewed:    Eugenie Birks Stress Test  10/22/2020: Nuclear stress EF: 66%. The left ventricular ejection fraction is hyperdynamic (>65%). There was no ST segment deviation noted during stress. No T wave inversion was noted during stress. The study is  normal. This is a low risk study.  EKG:  EKG is personally reviewed. 11/16/2022: Not ordered.  Recent Labs: 07/17/2022: Magnesium 1.9 07/18/2022: Hemoglobin 12.8; Platelets 195 07/23/2022: BUN 24; Creatinine, Ser 1.05; Potassium 4.3; Sodium 142   Recent Lipid Panel    Component Value Date/Time   CHOL 144 04/06/2021 0121   CHOL 173 05/01/2018 0803   TRIG 61 04/06/2021 0121   HDL 45 04/06/2021 0121   HDL 49 05/01/2018 0803   CHOLHDL 3.2 04/06/2021 0121   VLDL 12 04/06/2021 0121   LDLCALC 87 04/06/2021 0121   LDLCALC 109 (H) 05/01/2018 0803    Physical Exam:    VS:  BP (!) 186/70 (BP Location: Left Arm, Patient Position: Sitting, Cuff Size: Large)   Pulse 61   Ht 6' (1.829 m)   Wt 248 lb 8 oz (112.7 kg)   BMI 33.70 kg/m  , BMI Body mass  index is 33.7 kg/m. GENERAL:  Well appearing HEENT: Pupils equal round and reactive, fundi not visualized, oral mucosa unremarkable NECK:  No jugular venous distention, waveform within normal limits, carotid upstroke brisk and symmetric, LUNGS:  Clear to auscultation bilaterally HEART:  RRR.  PMI not displaced or sustained, S1 and S2 within normal limits, no S3, no S4, no clicks, no rubs, no murmurs ABD:  Flat, positive bowel sounds normal in frequency in pitch, no bruits, no rebound, no guarding, no midline pulsatile mass, no hepatomegaly, no splenomegaly EXT:  2 plus pulses throughout, 1+ LE pitting edema, no cyanosis, no clubbing SKIN:  No rashes, no nodules NEURO:  Cranial nerves II through XII grossly intact, motor grossly intact throughout PSYCH:  Cognitively intact, oriented to person place and time   ASSESSMENT/PLAN:       # Hypertension Uncontrolled despite being on three medications, indicating resistant hypertension. Patient has been off Spironolactone for two weeks. Patient has a history of low potassium. Lifestyle factors including diet and exercise are being addressed. -Order labs to check for hyperaldosteronism, given history  of low potassium. -Resume Spironolactone at a higher dose of 50mg  daily. -Check basic metabolic panel one week after starting Spironolactone. -Encourage continued lifestyle modifications including diet and exercise. -He consents to our remote patient monitoring to track blood pressure readings.  Agrees to use the [employer] system. -Follow up in one month to reassess blood pressure control.  # Obesity Patient's weight is contributing to hypertension. Discussed the impact of weight loss on blood pressure control. -Encourage weight loss, with an initial goal of losing 20 pounds. -Provide educational materials on hypertension and weight management.  # Osteoarthritis History of knee surgeries. No current use of NSAIDs due to potential impact on blood pressure. -Continue current pain management strategies.  # Sleep Apnea Patient is compliant with CPAP use. -Continue CPAP use as directed.      Screening for Secondary Hypertension:     11/16/2022    9:40 AM  Causes  Drugs/Herbals Screened     - Comments rare ibuprofen.  Higher use in past.  Occasional caffeine. Rare EtOH.  Renovascular HTN Screened     - Comments check renal artery Dopplers  Sleep Apnea Screened     - Comments uses CPAP regularly  Thyroid Disease Screened  Hyperaldosteronism Screened     - Comments Check renin/aldosterone  Pheochromocytoma N/A  Cushing's Syndrome N/A  Hyperparathyroidism Screened  Coarctation of the Aorta Screened     - Comments BP symmetric  Compliance Screened    Relevant Labs/Studies:    Latest Ref Rng & Units 07/23/2022   11:25 AM 07/18/2022    3:29 AM 07/17/2022   10:00 PM  Basic Labs  Sodium 135 - 145 mEq/L 142  133  133   Potassium 3.5 - 5.1 mEq/L 4.3  3.4  2.9   Creatinine 0.40 - 1.50 mg/dL 6.29  5.28  4.13        Latest Ref Rng & Units 09/05/2018    2:30 PM  Thyroid   TSH 0.40 - 4.50 mIU/L 1.20     Disposition:    FU with APP/PharmD in 1 month for the next 3 months.   FU  with Rhea Thrun C. [name] Salvia, MD, [facility] in 4 months.  Medication Adjustments/Labs and Tests Ordered: Current medicines are reviewed at length with the patient today.  Concerns regarding medicines are outlined above.   Orders Placed This Encounter  Procedures   Basic metabolic panel   Aldosterone +  renin activity w/ ratio   Meds ordered this encounter  Medications   spironolactone (ALDACTONE) 50 MG tablet    Sig: Take 1 tablet (50 mg total) by mouth daily.    Dispense:  90 tablet    Refill:  3    New dose, d/c 25 mg rx   I,Mathew Stumpf,acting as a scribe for Chilton Si, MD.,have documented all relevant documentation on the behalf of Chilton Si, MD,as directed by  Chilton Si, MD while in the presence of Chilton Si, MD.  I, Noland Pizano C. [name] Salvia, MD have reviewed all documentation for this visit.  The documentation of the exam, diagnosis, procedures, and orders on 11/16/2022 are all accurate and complete.   Signed, Chilton Si, MD  11/16/2022 2:05 PM    [facility]

## 2022-11-16 NOTE — Progress Notes (Signed)
[facility] Initial Assessment:    Date:  11/16/2022   ID:  Jeremy Fox, DOB 1945/12/29, MRN [id number]  PCP:  Loyola Mast, MD  Cardiologist:  Gypsy Balsam, MD  Nephrologist:  Referring MD: Loyola Mast, MD   CC: Hypertension  History of Present Illness:    Jeremy Fox is a 77 y.o. male with a hx of hypertension, hyperlipidemia, OSA, here to establish care in the [facility]. He last saw Dr. Bing Matter 11/2021 and blood pressure was 134/86. He had some LE edema and amlodipine was thought to be contributing. He was admitted for hernia repair 07/2022. He was switched from lisinopril/HCTZ to just lisinopril daily due to low potassium. He remained on minoxidil and spironolactone. His PCP switched him from carvedilol to labetalol and blood pressures remained elevated, so he was referred to the [facility].   Today, he is accompanied by a family member. In the office his blood pressure is 186/78 initially. On manual recheck it is 184/78 in the right arm, and 186/70 in the left arm. Of note, he has been out of spironolactone for 1-2 weeks. We reviewed his uploaded blood pressure logs, with ranges from 140-180/60-80. He confirms average readings at home in the 170's-180's. He has had four surgeries in the past 2 years, limiting his activity. However, in the last month he had been feeling better and worked hard around the house and heavy yard work. Yesterday he assisted with the removal of a tree trunk. He will go to the gym when he isn't being active. No anginal symptoms and no breathing issues. He uses his CPAP every night. His wife may use a pinch of salt when cooking, but otherwise he isn't adding any salt. Enjoys tomato sandwiches; he has now cut back to using 1 slice of bread. Usually drinks water or soda. No coffee or tea. Alcohol consumption is limited to a little bit of wine occasionally. Previously he was taking 1-2 tablets of  higher strength Bayer aspirin daily. In the past month he has not needed to take pain medications. He denies any palpitations, chest pain, shortness of breath, peripheral edema, lightheadedness, headaches, syncope, orthopnea, or PND.  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    Past Surgical History:  Procedure Laterality Date   CATARACT EXTRACTION W/ INTRAOCULAR LENS IMPLANT Bilateral    INCISIONAL HERNIA REPAIR N/A 07/16/2022   Procedure: LAPAROSCOPIC INCISIONAL HERNIA REPAIR WITH MESH;  Surgeon: Gaynelle Adu, MD;  Location: [facility];  Service: General;  Laterality: N/A;   REPLACEMENT TOTAL KNEE Bilateral    XI ROBOTIC ASSISTED SIMPLE PROSTATECTOMY N/A 06/26/2021   Procedure: XI ROBOTIC ASSISTED SIMPLE PROSTATECTOMY;  Surgeon: Sebastian Ache, MD;  Location: [facility];  Service: Urology;  Laterality: N/A;    Current Medications: Current Meds  Medication Sig   acetaminophen (TYLENOL) 500 MG tablet Take 1,000 mg by mouth every 6 (six) hours as needed for mild pain.   finasteride (PROSCAR)  5 MG tablet Take 5 mg by mouth daily.   labetalol (NORMODYNE) 200 MG tablet Take 1 tablet (200 mg total) by mouth 2 (two) times daily.   lisinopril (ZESTRIL) 20 MG tablet Take 1 tablet (20 mg total) by mouth in the morning and at bedtime.   minoxidil (LONITEN) 2.5 MG tablet Take 1 tablet (2.5 mg total) by mouth daily.   [DISCONTINUED] methocarbamol (ROBAXIN-750) 750 MG tablet Take 1  tablet (750 mg total) by mouth every 6 (six) hours as needed for muscle spasms.     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[employer]  Tobacco Use   Smoking status: Never    Passive exposure: Never   Smokeless tobacco: Never  Vaping Use   Vaping status: Never Used  Substance and Sexual Activity   Alcohol use: Never   Drug use: No   Sexual activity: Yes  Other Topics Concern   Not on file  Social History Narrative   Not on file   Social Determinants of Health   Financial Resource Strain: Not on file  Food Insecurity: No Food Insecurity (07/16/2022)   Hunger Vital Sign    Worried About Running Out of Food in the Last Year: Never true    Ran Out of Food in the Last Year: Never true  Transportation Needs: No Transportation Needs (07/16/2022)   PRAPARE - Administrator, Civil Service (Medical): No    Lack of Transportation (Non-Medical): No  Physical Activity: Inactive (11/16/2022)   Exercise Vital Sign    Days of Exercise per Week: 0 days    Minutes of Exercise per Session: 0 min  Stress: Not on file  Social Connections: Not on file     Family History: The patient's family history includes Diabetes in his brother; Heart attack (age of onset: 98) in his father; Heart disease in his father.  ROS:   Please see the history of present illness. All other systems reviewed and are negative.  EKGs/Labs/Other Studies Reviewed:    Eugenie Birks Stress Test  10/22/2020: Nuclear stress EF: 66%. The left ventricular ejection fraction is hyperdynamic (>65%). There was no ST segment deviation noted during stress. No T wave inversion was noted during stress. The study is  normal. This is a low risk study.  EKG:  EKG is personally reviewed. 11/16/2022: Not ordered.  Recent Labs: 07/17/2022: Magnesium 1.9 07/18/2022: Hemoglobin 12.8; Platelets 195 07/23/2022: BUN 24; Creatinine, Ser 1.05; Potassium 4.3; Sodium 142   Recent Lipid Panel    Component Value Date/Time   CHOL 144 04/06/2021 0121   CHOL 173 05/01/2018 0803   TRIG 61 04/06/2021 0121   HDL 45 04/06/2021 0121   HDL 49 05/01/2018 0803   CHOLHDL 3.2 04/06/2021 0121   VLDL 12 04/06/2021 0121   LDLCALC 87 04/06/2021 0121   LDLCALC 109 (H) 05/01/2018 0803    Physical Exam:    VS:  BP (!) 186/70 (BP Location: Left Arm, Patient Position: Sitting, Cuff Size: Large)   Pulse 61   Ht 6' (1.829 m)   Wt 248 lb 8 oz (112.7 kg)   BMI 33.70 kg/m  , BMI Body mass  index is 33.7 kg/m. GENERAL:  Well appearing HEENT: Pupils equal round and reactive, fundi not visualized, oral mucosa unremarkable NECK:  No jugular venous distention, waveform within normal limits, carotid upstroke brisk and symmetric, LUNGS:  Clear to auscultation bilaterally HEART:  RRR.  PMI not displaced or sustained, S1 and S2 within normal limits, no S3, no S4, no clicks, no rubs, no murmurs ABD:  Flat, positive bowel sounds normal in frequency in pitch, no bruits, no rebound, no guarding, no midline pulsatile mass, no hepatomegaly, no splenomegaly EXT:  2 plus pulses throughout, 1+ LE pitting edema, no cyanosis, no clubbing SKIN:  No rashes, no nodules NEURO:  Cranial nerves II through XII grossly intact, motor grossly intact throughout PSYCH:  Cognitively intact, oriented to person place and time   ASSESSMENT/PLAN:       # Hypertension Uncontrolled despite being on three medications, indicating resistant hypertension. Patient has been off Spironolactone for two weeks. Patient has a history of low potassium. Lifestyle factors including diet and exercise are being addressed. -Order labs to check for hyperaldosteronism, given history  of low potassium. -Resume Spironolactone at a higher dose of 50mg  daily. -Check basic metabolic panel one week after starting Spironolactone. -Encourage continued lifestyle modifications including diet and exercise. -He consents to our remote patient monitoring to track blood pressure readings.  Agrees to use the [employer] RPM system. -Follow up in one month to reassess blood pressure control.  # Obesity Patient's weight is contributing to hypertension. Discussed the impact of weight loss on blood pressure control. -Encourage weight loss, with an initial goal of losing 20 pounds. -Provide educational materials on hypertension and weight management.  # Osteoarthritis History of knee surgeries. No current use of NSAIDs due to potential impact on blood pressure. -Continue current pain management strategies.  # Sleep Apnea Patient is compliant with CPAP use. -Continue CPAP use as directed.      Screening for Secondary Hypertension:     11/16/2022    9:40 AM  Causes  Drugs/Herbals Screened     - Comments rare ibuprofen.  Higher use in past.  Occasional caffeine. Rare EtOH.  Renovascular HTN Screened     - Comments check renal artery Dopplers  Sleep Apnea Screened     - Comments uses CPAP regularly  Thyroid Disease Screened  Hyperaldosteronism Screened     - Comments Check renin/aldosterone  Pheochromocytoma N/A  Cushing's Syndrome N/A  Hyperparathyroidism Screened  Coarctation of the Aorta Screened     - Comments BP symmetric  Compliance Screened    Relevant Labs/Studies:    Latest Ref Rng & Units 07/23/2022   11:25 AM 07/18/2022    3:29 AM 07/17/2022   10:00 PM  Basic Labs  Sodium 135 - 145 mEq/L 142  133  133   Potassium 3.5 - 5.1 mEq/L 4.3  3.4  2.9   Creatinine 0.40 - 1.50 mg/dL 4.09  8.11  9.14        Latest Ref Rng & Units 09/05/2018    2:30 PM  Thyroid   TSH 0.40 - 4.50 mIU/L 1.20     Disposition:    FU with APP/PharmD in 1 month for the next 3 months.   FU  with Tiffany C. [name] Salvia, MD, [facility] in 4 months.  Medication Adjustments/Labs and Tests Ordered: Current medicines are reviewed at length with the patient today.  Concerns regarding medicines are outlined above.   No orders of the defined types were placed in this encounter.  No orders  of the defined types were placed in this encounter.  I,Mathew Stumpf,acting as a Neurosurgeon for Chilton Si, MD.,have documented all relevant documentation on the behalf of Chilton Si, MD,as directed by  Chilton Si, MD while in the presence of Chilton Si, MD.  I, Tiffany C. [name] Salvia, MD have reviewed all documentation for this visit.  The documentation of the exam, diagnosis, procedures, and orders on 11/16/2022 are all accurate and complete.   Guadalupe Maple  11/16/2022 9:55 AM    [facility]

## 2022-11-16 NOTE — Research (Signed)
Error

## 2022-11-16 NOTE — Addendum Note (Signed)
Addended by: Chilton Si C on: 11/16/2022 07:47 PM   Modules accepted: Orders

## 2022-11-16 NOTE — Research (Deleted)
I saw pt today after Dr. Leonides Sake follow up visit. Pt is in Dr. Leonides Sake Virtual Care HTN Study. Pt filled out research survey. Pt was enrolled in Group 2. Pt has successfully completed the Virtual Care HTN Study.

## 2022-11-16 NOTE — Telephone Encounter (Signed)
Per last OV note he was taking 2 tablet of the Labetalol 100 mg BID.  He needs a refill.   Please review and advise.  Thanks. Dm/cma

## 2022-11-16 NOTE — Research (Signed)
  Subject Name: Jeremy Fox met inclusion and exclusion criteria for the Virtual Care and Social Determinant Interventions for the management of hypertension trial.  The informed consent form, study requirements and expectations were reviewed with the subject by Dr. [name] Salvia and myself. The subject was given the opportunity to read the consent and ask questions. The subject verbalized understanding of the trial requirements.  All questions were addressed prior to the signing of the consent form. The subject agreed to participate in the trial and signed the informed consent. The informed consent was obtained prior to performance of any protocol-specific procedures for the subject.  A copy of the signed informed consent was given to the subject and a copy was placed in the subject's medical record.  Crixus D Clavel was randomized to Group 1.

## 2022-11-16 NOTE — Patient Instructions (Signed)
Medication Instructions: INCREASE YOUR SPIRONOLACTONE TO 50 MG DAILY    Labwork: RENIN/ALDOSTERONE TODAY   BMET IN 1 WEEK    Testing/Procedures: NONE   Follow-Up: 1 MONTH WITH PHARM D AT [facility]    Special Instructions:  MONITOR YOUR BLOOD PRESSURE TWICE A DAY WITH MACHINE PROVIDED AND LOG IN THE BOOK PROVIDED. BRING THE BOOK AND YOUR BLOOD PRESSURE MACHINE TO YOUR FOLLOW UP IN 1 MONTH    DASH Eating Plan DASH stands for "Dietary Approaches to Stop Hypertension." The DASH eating plan is a healthy eating plan that has been shown to reduce high blood pressure (hypertension). It may also reduce your risk for type 2 diabetes, heart disease, and stroke. The DASH eating plan may also help with weight loss. What are tips for following this plan?  General guidelines Avoid eating more than 2,300 mg (milligrams) of salt (sodium) a day. If you have hypertension, you may need to reduce your sodium intake to 1,500 mg a day. Limit alcohol intake to no more than 1 drink a day for nonpregnant women and 2 drinks a day for men. One drink equals 12 oz of beer, 5 oz of wine, or 1 oz of hard liquor. Work with your health care provider to maintain a healthy body weight or to lose weight. Ask what an ideal weight is for you. Get at least 30 minutes of exercise that causes your heart to beat faster (aerobic exercise) most days of the week. Activities may include walking, swimming, or biking. Work with your health care provider or diet and nutrition specialist (dietitian) to adjust your eating plan to your individual calorie needs. Reading food labels  Check food labels for the amount of sodium per serving. Choose foods with less than 5 percent of the Daily Value of sodium. Generally, foods with less than 300 mg of sodium per serving fit into this eating plan. To find whole grains, look for the word "whole" as the first word in the ingredient list. Shopping Buy products labeled as  "low-sodium" or "no salt added." Buy fresh foods. Avoid canned foods and premade or frozen meals. Cooking Avoid adding salt when cooking. Use salt-free seasonings or herbs instead of table salt or sea salt. Check with your health care provider or pharmacist before using salt substitutes. Do not fry foods. Cook foods using healthy methods such as baking, boiling, grilling, and broiling instead. Cook with heart-healthy oils, such as olive, canola, soybean, or sunflower oil. Meal planning Eat a balanced diet that includes: 5 or more servings of fruits and vegetables each day. At each meal, try to fill half of your plate with fruits and vegetables. Up to 6-8 servings of whole grains each day. Less than 6 oz of lean meat, poultry, or fish each day. A 3-oz serving of meat is about the same size as a deck of cards. One egg equals 1 oz. 2 servings of low-fat dairy each day. A serving of nuts, seeds, or beans 5 times each week. Heart-healthy fats. Healthy fats called Omega-3 fatty acids are found in foods such as flaxseeds and coldwater fish, like sardines, salmon, and mackerel. Limit how much you eat of the following: Canned or prepackaged foods. Food that is high in trans fat, such as fried foods. Food that is high in saturated fat, such as fatty meat. Sweets, desserts, sugary drinks, and other foods with added sugar. Full-fat dairy products. Do not salt foods before eating. Try to eat at least 2 vegetarian meals  each week. Eat more home-cooked food and less restaurant, buffet, and fast food. When eating at a restaurant, ask that your food be prepared with less salt or no salt, if possible. What foods are recommended? The items listed may not be a complete list. Talk with your dietitian about what dietary choices are best for you. Grains Whole-grain or whole-wheat bread. Whole-grain or whole-wheat pasta. Brown rice. Orpah Cobb. Bulgur. Whole-grain and low-sodium cereals. Pita bread. Low-fat,  low-sodium crackers. Whole-wheat flour tortillas. Vegetables Fresh or frozen vegetables (raw, steamed, roasted, or grilled). Low-sodium or reduced-sodium tomato and vegetable juice. Low-sodium or reduced-sodium tomato sauce and tomato paste. Low-sodium or reduced-sodium canned vegetables. Fruits All fresh, dried, or frozen fruit. Canned fruit in natural juice (without added sugar). Meat and other protein foods Skinless chicken or [place]. Ground chicken or [place]. Pork with fat trimmed off. Fish and seafood. Egg whites. Dried beans, peas, or lentils. Unsalted nuts, nut butters, and seeds. Unsalted canned beans. Lean cuts of beef with fat trimmed off. Low-sodium, lean deli meat. Dairy Low-fat (1%) or fat-free (skim) milk. Fat-free, low-fat, or reduced-fat cheeses. Nonfat, low-sodium ricotta or cottage cheese. Low-fat or nonfat yogurt. Low-fat, low-sodium cheese. Fats and oils Soft margarine without trans fats. Vegetable oil. Low-fat, reduced-fat, or light mayonnaise and salad dressings (reduced-sodium). Canola, safflower, olive, soybean, and sunflower oils. Avocado. Seasoning and other foods Herbs. Spices. Seasoning mixes without salt. Unsalted popcorn and pretzels. Fat-free sweets. What foods are not recommended? The items listed may not be a complete list. Talk with your dietitian about what dietary choices are best for you. Grains Baked goods made with fat, such as croissants, muffins, or some breads. Dry pasta or rice meal packs. Vegetables Creamed or fried vegetables. Vegetables in a cheese sauce. Regular canned vegetables (not low-sodium or reduced-sodium). Regular canned tomato sauce and paste (not low-sodium or reduced-sodium). Regular tomato and vegetable juice (not low-sodium or reduced-sodium). Rosita Fire. Olives. Fruits Canned fruit in a light or heavy syrup. Fried fruit. Fruit in cream or butter sauce. Meat and other protein foods Fatty cuts of meat. Ribs. Fried meat. Tomasa Blase. Sausage.  Bologna and other processed lunch meats. Salami. Fatback. Hotdogs. Bratwurst. Salted nuts and seeds. Canned beans with added salt. Canned or smoked fish. Whole eggs or egg yolks. Chicken or [place] with skin. Dairy Whole or 2% milk, cream, and half-and-half. Whole or full-fat cream cheese. Whole-fat or sweetened yogurt. Full-fat cheese. Nondairy creamers. Whipped toppings. Processed cheese and cheese spreads. Fats and oils Butter. Stick margarine. Lard. Shortening. Ghee. Bacon fat. Tropical oils, such as coconut, palm kernel, or palm oil. Seasoning and other foods Salted popcorn and pretzels. Onion salt, garlic salt, seasoned salt, table salt, and sea salt. Worcestershire sauce. Tartar sauce. Barbecue sauce. Teriyaki sauce. Soy sauce, including reduced-sodium. Steak sauce. Canned and packaged gravies. Fish sauce. Oyster sauce. Cocktail sauce. Horseradish that you find on the shelf. Ketchup. Mustard. Meat flavorings and tenderizers. Bouillon cubes. Hot sauce and Tabasco sauce. Premade or packaged marinades. Premade or packaged taco seasonings. Relishes. Regular salad dressings. Where to find more information: National Heart, Lung, and Blood Institute: PopSteam.is American Heart Association: www.heart.org Summary The DASH eating plan is a healthy eating plan that has been shown to reduce high blood pressure (hypertension). It may also reduce your risk for type 2 diabetes, heart disease, and stroke. With the DASH eating plan, you should limit salt (sodium) intake to 2,300 mg a day. If you have hypertension, you may need to reduce your sodium intake to 1,500 mg  a day. When on the DASH eating plan, aim to eat more fresh fruits and vegetables, whole grains, lean proteins, low-fat dairy, and heart-healthy fats. Work with your health care provider or diet and nutrition specialist (dietitian) to adjust your eating plan to your individual calorie needs. This information is not intended to replace advice  given to you by your health care provider. Make sure you discuss any questions you have with your health care provider. Document Released: 02/11/2011 Document Revised: 02/04/2017 Document Reviewed: 02/16/2016 Elsevier Patient Education  2020 [employer].

## 2022-11-17 LAB — BASIC METABOLIC PANEL
BUN/Creatinine Ratio: 15 (ref 10–24)
BUN: 16 mg/dL (ref 8–27)
CO2: 21 mmol/L (ref 20–29)
Calcium: 8.5 mg/dL — ABNORMAL LOW (ref 8.6–10.2)
Chloride: 106 mmol/L (ref 96–106)
Creatinine, Ser: 1.04 mg/dL (ref 0.76–1.27)
Glucose: 94 mg/dL (ref 70–99)
Potassium: 4.6 mmol/L (ref 3.5–5.2)
Sodium: 141 mmol/L (ref 134–144)
eGFR: 73 mL/min/{1.73_m2} (ref >59)

## 2022-11-17 NOTE — Telephone Encounter (Signed)
Lft VM  that RX was sent to the pharmacy. Dm/cma  

## 2022-11-27 ENCOUNTER — Other Ambulatory Visit: Payer: Self-pay | Admitting: Family Medicine

## 2022-11-27 DIAGNOSIS — I1A0 Resistant hypertension: Secondary | ICD-10-CM

## 2022-12-08 ENCOUNTER — Encounter: Payer: Self-pay | Admitting: Family Medicine

## 2022-12-08 ENCOUNTER — Ambulatory Visit (INDEPENDENT_AMBULATORY_CARE_PROVIDER_SITE_OTHER): Payer: PPO | Admitting: Family Medicine

## 2022-12-08 ENCOUNTER — Encounter (HOSPITAL_BASED_OUTPATIENT_CLINIC_OR_DEPARTMENT_OTHER): Payer: Self-pay | Admitting: Cardiovascular Disease

## 2022-12-08 VITALS — BP 136/70 | HR 66 | Temp 98.0°F | Ht 72.0 in | Wt 244.0 lb

## 2022-12-08 DIAGNOSIS — I1A0 Resistant hypertension: Secondary | ICD-10-CM | POA: Diagnosis not present

## 2022-12-08 LAB — BASIC METABOLIC PANEL
BUN: 18 mg/dL (ref 6–23)
CO2: 24 meq/L (ref 19–32)
Calcium: 8.9 mg/dL (ref 8.4–10.5)
Chloride: 98 meq/L (ref 96–112)
Creatinine, Ser: 1.11 mg/dL (ref 0.40–1.50)
GFR: 63.1 mL/min (ref >60.00)
Glucose, Bld: 98 mg/dL (ref 70–99)
Potassium: 5 meq/L (ref 3.5–5.1)
Sodium: 130 meq/L — ABNORMAL LOW (ref 135–145)

## 2022-12-08 NOTE — Progress Notes (Signed)
[facility] [address] [place] [address] Dept: [phone] Dept Fax: [phone]  Chronic Care Office Visit  Subjective:    Patient ID: Jeremy Fox, male    DOB: 1945-08-15, 77 y.o..   MRN: [id number]  Chief Complaint  Patient presents with   Hypertension    F/u  HTN.      History of Present Illness:  Patient is in today for reassessment of chronic medical issues.  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 He was admitted at [facility] from 5/10-5/02/2023 and underwent a laparoscopic incisional hernia repair with mesh. His preoperative labs showed a low sodium and low potassium. The hospitalist switched him from Zestoretic 20-25 mg bid to lisinopril 20 mg daily. He continued on minoxidil 2.5 mg daily. His sodium and potassium have normalized since. However, his blood pressure control has been worse since then. He had not been able to tolerate amlodipine in the past. I referred him tot he [facility]. He is currently managed on lisinopril 20 mg bid (40 mg daily), minoxidil 2.5 mg daily, labetalol 200 mg twice daily, spironolactone 50 mg daily (increased by Dr. [name] Salvia at the [facility] visit). Jeremy Fox has been back to going to the gym. He is doing both stationary bike and rowing machine exercises for 35-45 min. His home BP 7-day average is 157/76 in the morning, and 155/78 in the evening.  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   Ventral hernia without obstruction or gangrene 06/01/2022   Dermal nevus of parietal region of scalp 06/01/2022   Sensorineural hearing loss (SNHL) of both ears 12/11/2021   Swelling of both lower extremities 11/10/2021   Urinary retention 04/05/2021   Benign prostatic hyperplasia 10/14/2020   Class 1 obesity due to excess calories with  serious comorbidity and body mass index (BMI) of 31.0 to 31.9 in adult 09/18/2020   Onychomycosis of right great toe 06/07/2019   Prominent metatarsal head, right 06/07/2019   S/P arthroscopy of right shoulder 06/05/2019   Chronic pain of both knees 05/08/2019   Chronic right shoulder pain 05/08/2019   Elevated PSA 09/05/2018   Memory loss 09/05/2018   OSA (obstructive sleep apnea) 03/29/2016   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   Dyslipidemia 06/03/2015   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/03/2015   Rotator cuff syndrome of left shoulder 06/03/2015   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   Past Surgical History:  Procedure Laterality Date   CATARACT EXTRACTION W/ INTRAOCULAR LENS IMPLANT Bilateral    INCISIONAL HERNIA REPAIR N/A 07/16/2022   Procedure: LAPAROSCOPIC INCISIONAL HERNIA REPAIR WITH MESH;  Surgeon: Gaynelle Adu, MD;  Location: [facility];  Service: General;  Laterality: N/A;   REPLACEMENT TOTAL KNEE Bilateral    XI ROBOTIC ASSISTED SIMPLE PROSTATECTOMY N/A 06/26/2021   Procedure: XI ROBOTIC ASSISTED SIMPLE PROSTATECTOMY;  Surgeon: Sebastian Ache, MD;  Location: [facility] ORS;  Service: Urology;  Laterality: N/A;   Family History  Problem Relation Age of Onset   Heart attack Father 45   Heart disease Father    Diabetes Brother    Outpatient Medications Prior to Visit  Medication Sig Dispense Refill   acetaminophen (TYLENOL) 500 MG tablet Take 1,000 mg by mouth every 6 (six) hours as needed for mild pain.     finasteride (PROSCAR) 5 MG tablet Take 5 mg by mouth daily.  labetalol (NORMODYNE) 100 MG tablet Take 2 tablets (200 mg total) by mouth 2 (two) times daily. 180 tablet 3   labetalol (NORMODYNE) 200 MG tablet Take 1 tablet (200 mg total) by mouth 2 (two) times daily.     lisinopril (ZESTRIL) 20 MG tablet Take 1 tablet (20 mg total) by mouth in the morning and at bedtime. 180 tablet 3   minoxidil (LONITEN) 2.5 MG tablet Take 1 tablet (2.5 mg total) by mouth daily.  30 tablet 12   spironolactone (ALDACTONE) 50 MG tablet Take 1 tablet (50 mg total) by mouth daily. 90 tablet 3   aspirin EC 81 MG tablet Take 81 mg by mouth daily. Swallow whole. (Patient not taking: Reported on 11/16/2022)     No facility-administered medications prior to visit.   Allergies  Allergen Reactions   Sulfa Antibiotics Rash   Sulfamethoxazole Rash   Objective:   Today's Vitals   12/08/22 0753  BP: 136/70  Pulse: 66  Temp: 98 F (36.7 C)  TempSrc: Temporal  SpO2: 98%  Weight: 244 lb (110.7 kg)  Height: 6' (1.829 m)   Body mass index is 33.09 kg/m.   General: Well developed, well nourished. No acute distress. Extremities: Trace edema. Skin: Warm and dry. No rashes. Neuro: CN II-XII intact. Normal sensation and DTR bilaterally. Psych: Alert and oriented. Normal mood and affect.  There are no preventive care reminders to display for this patient.    Assessment & Plan:   Problem List Items Addressed This Visit       Cardiovascular and Mediastinum   Resistant hypertension - Primary    Blood pressure remains mildly high, but is down significantly in clinic (remains high at home). Continue lisinopril 20 mg bid, minoxidil 2.5 mg daily, spironolactone 50 mg daily and labetalol 200 mg bid. I will recheck his potassium now that he is on a higher spironolactone dose. Dr. [name] Salvia had ordered an aldosterone and renin activity, but the result has not come back. I will send her a note to ask if he needs to have this redrawn.      Relevant Orders   Basic metabolic panel    Return in about 2 months (around 02/07/2023) for Reassessment.   Loyola Mast, MD

## 2022-12-08 NOTE — Assessment & Plan Note (Signed)
Blood pressure remains mildly high, but is down significantly in clinic (remains high at home). Continue lisinopril 20 mg bid, minoxidil 2.5 mg daily, spironolactone 50 mg daily and labetalol 200 mg bid. I will recheck his potassium now that he is on a higher spironolactone dose. Dr. [name] Salvia had ordered an aldosterone and renin activity, but the result has not come back. I will send her a note to ask if he needs to have this redrawn.

## 2022-12-12 LAB — ALDOSTERONE + RENIN ACTIVITY W/ RATIO
Aldos/Renin Ratio: 31.6 — ABNORMAL HIGH (ref 0.0–30.0)
Aldosterone: 19 ng/dL (ref 0.0–30.0)
Renin Activity, Plasma: 0.601 ng/mL/h (ref 0.167–5.380)

## 2022-12-16 ENCOUNTER — Encounter: Payer: Self-pay | Admitting: Pharmacist Clinician (PhC)/ Clinical Pharmacy Specialist

## 2022-12-16 ENCOUNTER — Ambulatory Visit: Payer: PPO | Attending: Internal Medicine | Admitting: Pharmacist Clinician (PhC)/ Clinical Pharmacy Specialist

## 2022-12-16 VITALS — BP 176/80 | HR 66

## 2022-12-16 DIAGNOSIS — I1A0 Resistant hypertension: Secondary | ICD-10-CM | POA: Diagnosis not present

## 2022-12-16 MED ORDER — MINOXIDIL 2.5 MG PO TABS: 2.5000 mg | ORAL_TABLET | Freq: Two times a day (BID) | ORAL | 1 refills | Status: DC

## 2022-12-16 NOTE — Assessment & Plan Note (Signed)
Assessment: BP is uncontrolled in office BP 176/80 mmHg;  above the goal (<130/80). Home average better at 161/79 (am) and 155/78 (pm) Tolerates current medications well without any side effects Denies SOB, palpitation, chest pain, headaches,or swelling Does complain of fatigue, but cannot say if has started with med changes or not Reiterated the importance of regular exercise and low salt diet   Plan:  Increase minoxidil to 2.5 mg bid Continue taking all other medications Patient to keep record of BP readings with heart rate and report to [place] at the next visit Patient to follow up with PharmD in 1 month  Labs ordered today:  none

## 2022-12-16 NOTE — Patient Instructions (Signed)
Follow up appointment: Monday Nov 11 at 9:30 am  Take your BP meds as follows:  INCREASE MINOXIDIL TO 2.5 MG TWICE DAILY  CONTINUE WITH ALL OTHER MEDICATIONS  Check your blood pressure at home daily (if able) and keep record of the readings.  Hypertension "High blood pressure"  Hypertension is often called "The Silent Killer." It rarely causes symptoms until it is extremely  high or has done damage to other organs in the body. For this reason, you should have your  blood pressure checked regularly by your physician. We will check your blood pressure  every time you see a provider at one of our offices.   Your blood pressure reading consists of two numbers. Ideally, blood pressure should be  below 120/80. The first ("top") number is called the systolic pressure. It measures the  pressure in your arteries as your heart beats. The second ("bottom") number is called the diastolic pressure. It measures the pressure in your arteries as the heart relaxes between beats.  The benefits of getting your blood pressure under control are enormous. A 10-point  reduction in systolic blood pressure can reduce your risk of stroke by 27% and heart failure by 28%  Your blood pressure goal is < 130/80   To check your pressure at home you will need to:  1. Sit up in a chair, with feet flat on the floor and back supported. Do not cross your ankles or legs. 2. Rest your left arm so that the cuff is about heart level. If the cuff goes on your upper arm,  then just relax the arm on the table, arm of the chair or your lap. If you have a wrist cuff, we  suggest relaxing your wrist against your chest (think of it as Pledging the Flag with the  wrong arm).  3. Place the cuff snugly around your arm, about 1 inch above the crook of your elbow. The  cords should be inside the groove of your elbow.  4. Sit quietly, with the cuff in place, for about 5 minutes. After that 5 minutes press the power  button to start  a reading. 5. Do not talk or move while the reading is taking place.  6. Record your readings on a sheet of paper. Although most cuffs have a memory, it is often  easier to see a pattern developing when the numbers are all in front of you.  7. You can repeat the reading after 1-3 minutes if it is recommended  Make sure your bladder is empty and you have not had caffeine or tobacco within the last 30 min  Always bring your blood pressure log with you to your appointments. If you have not brought your monitor in to be double checked for accuracy, please bring it to your next appointment.  You can find a list of quality blood pressure cuffs at validatebp.org

## 2022-12-16 NOTE — Progress Notes (Signed)
Office Visit    Patient Name: Jeremy Fox Date of Encounter: 12/16/2022  Primary Care Provider:  Loyola Mast, MD Primary Cardiologist:  None  Chief Complaint    Hypertension - Advanced hypertension clinic  Past Medical History   HLD 1/23 LDL 87, no medications, no ASCVD  OSA Compliant with CPAP    Allergies  Allergen Reactions   Sulfa Antibiotics Rash   Sulfamethoxazole Rash    History of Present Illness    Jeremy Fox is a 77 y.o. male patient who was referred to the [facility] by Dr. Herbie Drape.  He saw Dr. [name] Salvia last month, at which time his pressure was 186/78, with similar readings in both arms.  At the time he was on labetalol 200 mg bid, lisinopril 20 mg bid and minoxidil 2.5 mg daily.  He had previously been on carvedilol (switched to labetalol) and HCTZ (d/c 2/2 low potassium).  He also had been on spironolactone 25 mg, but had run out of this prior to his appointment.  She restarted him at 50 mg daily and tested for hyperaldosteronism.  He was agreeable to join our RPM research and was randomized to group 1.    Today he is in the office for his first follow up visit.  Feels his energy is low, not sure if caused by meds or multiple surgeries.  In May had hernia surgery, and states that his BP became hard to control after that. In looking through the chart he was on lisinopril  hct 20/25 bid and minoxidil 2.5 mg daily prior to admit.  He also states that his pressure is usually higher in medical settings  Blood Pressure Goal:  130/80  Current Medications: labetalol 200 mg bid, lisinopril 20 mg bid, minoxidil 2.5 mg every day, spironolactone 50 mg every day   Adherence Assessment  Do you ever forget to take your medication? [] Yes [x] No  Do you ever skip doses due to side effects? [] Yes [x] No  Do you have trouble affording your medicines? [] Yes [x] No  Are you ever unable to pick up your medication due to transportation  difficulties? [] Yes [x] No   Adherence strategy: 7 day minder  Previously tried:   carvedilol - switched to labetalol for better control;  hctz - hypokalemia  Family Hx:  father died from MI 79 26 (never went to [place]), mother no heart history; brother died with DM complications, other brother (older) living with hypertension; 2 kids no known heart disease  Social Hx:      Tobacco: never  Alcohol: occasional wine  Caffeine:  no coffee/tea, occasional Cheerwine, Coke  Diet:  mostly home cooked meals , lots of venison, soups, spaghetti, garden vegetables, freezes many of them - has green beans, peas, okra, tomatoes   Exercise: Gym in [place] three days per week as able, usually 20 min on bike and 20 rowing.  Yesterday did 50 minutes on bike  Home BP readings:      24 AM readings average 161/79  HR 65  (range 147-177/71-88)    23 PM readings average 155/78  HR 62  (range 137/170/70-87)   Accessory Clinical Findings    Lab Results  Component Value Date   CREATININE 1.11 12/08/2022   BUN 18 12/08/2022   NA 130 (L) 12/08/2022   K 5.0 12/08/2022   CL 98 12/08/2022   CO2 24 12/08/2022   Lab Results  Component Value Date   ALT 31 04/05/2021   AST 33 04/05/2021  ALKPHOS 85 04/05/2021   BILITOT 1.0 04/05/2021   No results found for: "HGBA1C"  Screening for Secondary Hypertension:      11/16/2022    9:40 AM  Causes  Drugs/Herbals Screened     - Comments rare ibuprofen.  Higher use in past.  Occasional caffeine. Rare EtOH.  Renovascular HTN Screened     - Comments check renal artery Dopplers  Sleep Apnea Screened     - Comments uses CPAP regularly  Thyroid Disease Screened  Hyperaldosteronism Screened     - Comments Check renin/aldosterone  Pheochromocytoma N/A  Cushing's Syndrome N/A  Hyperparathyroidism Screened  Coarctation of the Aorta Screened     - Comments BP symmetric  Compliance Screened    Relevant Labs/Studies:    Latest Ref Rng & Units 12/08/2022    8:39  AM 11/16/2022   10:38 AM 07/23/2022   11:25 AM  Basic Labs  Sodium 135 - 145 mEq/L 130  141  142   Potassium 3.5 - 5.1 mEq/L 5.0  4.6  4.3   Creatinine 0.40 - 1.50 mg/dL 1.61  0.96  0.45        Latest Ref Rng & Units 09/05/2018    2:30 PM  Thyroid   TSH 0.40 - 4.50 mIU/L 1.20        Latest Ref Rng & Units 12/09/2022    9:11 AM  Renin/Aldosterone   Aldosterone 0.0 - 30.0 ng/dL 40.9   Aldos/Renin Ratio 0.0 - 30.0 31.6                Home Medications    Current Outpatient Medications  Medication Sig Dispense Refill   minoxidil (LONITEN) 2.5 MG tablet Take 1 tablet (2.5 mg total) by mouth 2 (two) times daily. 180 tablet 1   acetaminophen (TYLENOL) 500 MG tablet Take 1,000 mg by mouth every 6 (six) hours as needed for mild pain.     aspirin EC 81 MG tablet Take 81 mg by mouth daily. Swallow whole. (Patient not taking: Reported on 11/16/2022)     finasteride (PROSCAR) 5 MG tablet Take 5 mg by mouth daily.     labetalol (NORMODYNE) 100 MG tablet Take 2 tablets (200 mg total) by mouth 2 (two) times daily. 180 tablet 3   lisinopril (ZESTRIL) 20 MG tablet Take 1 tablet (20 mg total) by mouth in the morning and at bedtime. 180 tablet 3   spironolactone (ALDACTONE) 50 MG tablet Take 1 tablet (50 mg total) by mouth daily. 90 tablet 3   No current facility-administered medications for this visit.     Assessment & Plan   HYPERTENSION CONTROL Vitals:   12/16/22 0926 12/16/22 0932  BP: (!) 184/81 (!) 176/80    The patient's blood pressure is elevated above target today.  In order to address the patient's elevated BP: A current anti-hypertensive medication was adjusted today.     Resistant hypertension Assessment: BP is uncontrolled in office BP 176/80 mmHg;  above the goal (<130/80). Home average better at 161/79 (am) and 155/78 (pm) Tolerates current medications well without any side effects Denies SOB, palpitation, chest pain, headaches,or swelling Does complain of fatigue,  but cannot say if has started with med changes or not Reiterated the importance of regular exercise and low salt diet   Plan:  Increase minoxidil to 2.5 mg bid Continue taking all other medications Patient to keep record of BP readings with heart rate and report to [place] at the next visit Patient to follow up with  PharmD in 1 month  Labs ordered today:  none   Phillips Hay PharmD CPP [facility]  [address] [place], [place] [address] [phone]

## 2022-12-17 ENCOUNTER — Telehealth (HOSPITAL_BASED_OUTPATIENT_CLINIC_OR_DEPARTMENT_OTHER): Payer: Self-pay | Admitting: *Deleted

## 2022-12-17 DIAGNOSIS — I1A0 Resistant hypertension: Secondary | ICD-10-CM

## 2022-12-17 NOTE — Telephone Encounter (Signed)
-----   Message from Wilbur sent at 12/16/2022 11:03 PM EDT ----- Labs are indeterminate for hyperaldosteronism but suspicious given that it is so abnormal even while taking spironolactone.  Recommend getting a CT of the abdomen adrenal protocol.

## 2022-12-21 NOTE — Telephone Encounter (Signed)
  Burnell Blanks, LPN 56/43/3295  5:53 PM EDT Back to Top    Left message to call back

## 2022-12-30 ENCOUNTER — Telehealth (HOSPITAL_BASED_OUTPATIENT_CLINIC_OR_DEPARTMENT_OTHER): Payer: Self-pay

## 2022-12-30 NOTE — Telephone Encounter (Addendum)
2nd call attempt to patient, Results called to patient who verbalizes understanding! He is aware to look for scheduling call.   Will route to scheduling team for a call.   ----- Message from Chilton Si sent at 12/16/2022 11:03 PM EDT ----- Labs are indeterminate for hyperaldosteronism but suspicious given that it is so abnormal even while taking spironolactone.  Recommend getting a CT of the abdomen adrenal protocol.

## 2023-01-16 NOTE — Progress Notes (Signed)
Office Visit    Patient Name: Jeremy Fox Date of Encounter: 01/20/2023  Primary Care Provider:  Loyola Mast, MD Primary Cardiologist:  None  Chief Complaint    Hypertension - Advanced hypertension clinic  Past Medical History   HLD 1/23 LDL 87, no medications, no ASCVD  OSA Compliant with CPAP    Allergies  Allergen Reactions   Sulfa Antibiotics Rash   Sulfamethoxazole Rash    History of Present Illness    Jeremy Fox is a 77 y.o. male patient who was referred to the [facility] by Dr. Herbie Drape.  He saw Dr. [name] Salvia last month, at which time his pressure was 186/78, with similar readings in both arms.  At the time he was on labetalol 200 mg bid, lisinopril 20 mg bid and minoxidil 2.5 mg daily.  He had previously been on carvedilol (switched to labetalol) and HCTZ (d/c 2/2 low potassium).  He also had been on spironolactone 25 mg, but had run out of this prior to his appointment.  She restarted him at 50 mg daily and tested for hyperaldosteronism.  He was agreeable to join our RPM research and was randomized to group 1.  At first follow up visit minoxidil was increased to 2.5 mg twice daily.  At that visit patient noted his BP became harder to control after having surgery.   Renin aldosterone labs showed elevated ratio despite being on spironolactone and abdominal CT has been ordered.  Today he returns for visit #2 as part of the RPM research.   Blood pressure better after going to gym, but home averages are similar to last visit.  Vivify cuff reads close to 20 points higher than the office device.     Blood Pressure Goal:  130/80  Current Medications: labetalol 200 mg bid, lisinopril 20 mg bid, minoxidil 2.5 mg bid, spironolactone 50 mg every day   Adherence Assessment  Do you ever forget to take your medication? [] Yes [x] No  Do you ever skip doses due to side effects? [] Yes [x] No  Do you have trouble affording your medicines? [] Yes [x] No   Are you ever unable to pick up your medication due to transportation difficulties? [] Yes [x] No   Adherence strategy: 7 day minder  Previously tried:   carvedilol - switched to labetalol for better control;  hctz - hypokalemia; amlodipine - edema  Family Hx:  father died from MI 47 71 (never went to [place]), mother no heart history; brother died with DM complications, other brother (older) living with hypertension; 2 kids no known heart disease  Social Hx:      Tobacco: never  Alcohol: occasional wine  Caffeine:  no coffee/tea, occasional Cheerwine, Coke  Diet:  mostly home cooked meals , lots of venison, soups, spaghetti, garden vegetables, freezes many of them - has green beans, peas, okra, tomatoes   Exercise: Gym in [place] three days per week as able, usually 20 min on bike and 20 rowing.  Yesterday did 50 minutes on bike     Home BP readings:  last 2 weeks:    AM readings average  162/76  HR 70  range 144-170/71-87  previous average 161/79     PM readings average  159/74  HR 68  range 141-179/58-82  previous average 155/78    Accessory Clinical Findings    Lab Results  Component Value Date   CREATININE 1.11 12/08/2022   BUN 18 12/08/2022   NA 130 (L) 12/08/2022   K 5.0  12/08/2022   CL 98 12/08/2022   CO2 24 12/08/2022   Lab Results  Component Value Date   ALT 31 04/05/2021   AST 33 04/05/2021   ALKPHOS 85 04/05/2021   BILITOT 1.0 04/05/2021   No results found for: "HGBA1C"  Screening for Secondary Hypertension:      11/16/2022    9:40 AM  Causes  Drugs/Herbals Screened     - Comments rare ibuprofen.  Higher use in past.  Occasional caffeine. Rare EtOH.  Renovascular HTN Screened     - Comments check renal artery Dopplers  Sleep Apnea Screened     - Comments uses CPAP regularly  Thyroid Disease Screened  Hyperaldosteronism Screened     - Comments Check renin/aldosterone  Pheochromocytoma N/A  Cushing's Syndrome N/A  Hyperparathyroidism Screened   Coarctation of the Aorta Screened     - Comments BP symmetric  Compliance Screened    Relevant Labs/Studies:    Latest Ref Rng & Units 12/08/2022    8:39 AM 11/16/2022   10:38 AM 07/23/2022   11:25 AM  Basic Labs  Sodium 135 - 145 mEq/L 130  141  142   Potassium 3.5 - 5.1 mEq/L 5.0  4.6  4.3   Creatinine 0.40 - 1.50 mg/dL 1.61  0.96  0.45        Latest Ref Rng & Units 09/05/2018    2:30 PM  Thyroid   TSH 0.40 - 4.50 mIU/L 1.20        Latest Ref Rng & Units 12/09/2022    9:11 AM  Renin/Aldosterone   Aldosterone 0.0 - 30.0 ng/dL 40.9   Aldos/Renin Ratio 0.0 - 30.0 31.6                Home Medications    Current Outpatient Medications  Medication Sig Dispense Refill   lisinopril (ZESTRIL) 20 MG tablet Take 1 tablet (20 mg total) by mouth daily. 90 tablet 3   spironolactone (ALDACTONE) 50 MG tablet Take 1.5 tablets (75 mg total) by mouth daily. 45 tablet 12   acetaminophen (TYLENOL) 500 MG tablet Take 1,000 mg by mouth every 6 (six) hours as needed for mild pain.     aspirin EC 81 MG tablet Take 81 mg by mouth daily. Swallow whole. (Patient not taking: Reported on 11/16/2022)     finasteride (PROSCAR) 5 MG tablet Take 5 mg by mouth daily.     labetalol (NORMODYNE) 100 MG tablet Take 2 tablets (200 mg total) by mouth 2 (two) times daily. 180 tablet 3   minoxidil (LONITEN) 2.5 MG tablet Take 1 tablet (2.5 mg total) by mouth 2 (two) times daily. 180 tablet 1   No current facility-administered medications for this visit.     Assessment & Plan    Resistant hypertension Assessment: BP is uncontrolled in office BP 160/80 mmHg;  above the goal (<130/80). Needs to schedule abdominal CT for possible hyperaldosteronism Would like to increase spironolactone, but most recent potassium level was 5 Denies SOB, palpitation, chest pain, headaches,or swelling Reiterated the importance of regular exercise and low salt diet   Plan:  Decrease lisinopril to 20 mg daily Increase  spironolactone to 75 mg daily Continue taking labetalol and minoxidil Patient to keep record of BP readings with heart rate and report to [place] at the next visit Patient to follow up with PharmD in 1 month  Labs ordered today:  BMET in 10 days   Phillips Hay PharmD CPP [facility]  [address]  [address] [place], [place] [address] [phone]

## 2023-01-17 ENCOUNTER — Ambulatory Visit: Payer: PPO | Attending: Cardiology | Admitting: Pharmacist Clinician (PhC)/ Clinical Pharmacy Specialist

## 2023-01-17 VITALS — BP 178/91 | HR 90

## 2023-01-17 DIAGNOSIS — I1A0 Resistant hypertension: Secondary | ICD-10-CM | POA: Diagnosis not present

## 2023-01-17 MED ORDER — LISINOPRIL 20 MG PO TABS: 20.0000 mg | ORAL_TABLET | Freq: Every day | ORAL | 3 refills | Status: DC

## 2023-01-17 MED ORDER — SPIRONOLACTONE 50 MG PO TABS: 75.0000 mg | ORAL_TABLET | Freq: Every day | ORAL | 12 refills | Status: DC

## 2023-01-17 NOTE — Patient Instructions (Signed)
Follow up appointment: December 16 at 9:30 am  We will get you set up to get a CT scan of your kidneys/adrenal glands to see if there is any indication of hyperaldosteronism.    Go to the lab in 10 days to check kidney function.    Take your BP meds as follows:  Cut lisinopril to 20 mg once daily (at night)  Increase spironolactone to 75 mg (1.5 of the 50 mg tablets)  Check your blood pressure at home daily (if able) and keep record of the readings.  Hypertension "High blood pressure"  Hypertension is often called "The Silent Killer." It rarely causes symptoms until it is extremely  high or has done damage to other organs in the body. For this reason, you should have your  blood pressure checked regularly by your physician. We will check your blood pressure  every time you see a provider at one of our offices.   Your blood pressure reading consists of two numbers. Ideally, blood pressure should be  below 120/80. The first ("top") number is called the systolic pressure. It measures the  pressure in your arteries as your heart beats. The second ("bottom") number is called the diastolic pressure. It measures the pressure in your arteries as the heart relaxes between beats.  The benefits of getting your blood pressure under control are enormous. A 10-point  reduction in systolic blood pressure can reduce your risk of stroke by 27% and heart failure by 28%  Your blood pressure goal is <140/90  To check your pressure at home you will need to:  1. Sit up in a chair, with feet flat on the floor and back supported. Do not cross your ankles or legs. 2. Rest your left arm so that the cuff is about heart level. If the cuff goes on your upper arm,  then just relax the arm on the table, arm of the chair or your lap. If you have a wrist cuff, we  suggest relaxing your wrist against your chest (think of it as Pledging the Flag with the  wrong arm).  3. Place the cuff snugly around your arm,  about 1 inch above the crook of your elbow. The  cords should be inside the groove of your elbow.  4. Sit quietly, with the cuff in place, for about 5 minutes. After that 5 minutes press the power  button to start a reading. 5. Do not talk or move while the reading is taking place.  6. Record your readings on a sheet of paper. Although most cuffs have a memory, it is often  easier to see a pattern developing when the numbers are all in front of you.  7. You can repeat the reading after 1-3 minutes if it is recommended  Make sure your bladder is empty and you have not had caffeine or tobacco within the last 30 min  Always bring your blood pressure log with you to your appointments. If you have not brought your monitor in to be double checked for accuracy, please bring it to your next appointment.  You can find a list of quality blood pressure cuffs at validatebp.org

## 2023-01-20 ENCOUNTER — Encounter: Payer: Self-pay | Admitting: Pharmacist Clinician (PhC)/ Clinical Pharmacy Specialist

## 2023-01-20 NOTE — Assessment & Plan Note (Signed)
Assessment: BP is uncontrolled in office BP 160/80 mmHg;  above the goal (<130/80). Needs to schedule abdominal CT for possible hyperaldosteronism Would like to increase spironolactone, but most recent potassium level was 5 Denies SOB, palpitation, chest pain, headaches,or swelling Reiterated the importance of regular exercise and low salt diet   Plan:  Decrease lisinopril to 20 mg daily Increase spironolactone to 75 mg daily Continue taking labetalol and minoxidil Patient to keep record of BP readings with heart rate and report to [place] at the next visit Patient to follow up with PharmD in 1 month  Labs ordered today:  BMET in 10 days

## 2023-01-28 LAB — BASIC METABOLIC PANEL
BUN/Creatinine Ratio: 19 (ref 10–24)
BUN: 24 mg/dL (ref 8–27)
CO2: 21 mmol/L (ref 20–29)
Calcium: 9.2 mg/dL (ref 8.6–10.2)
Chloride: 98 mmol/L (ref 96–106)
Creatinine, Ser: 1.25 mg/dL (ref 0.76–1.27)
Glucose: 76 mg/dL (ref 70–99)
Potassium: 5.4 mmol/L — ABNORMAL HIGH (ref 3.5–5.2)
Sodium: 135 mmol/L (ref 134–144)
eGFR: 59 mL/min/{1.73_m2} — ABNORMAL LOW (ref >59)

## 2023-02-02 ENCOUNTER — Ambulatory Visit: Payer: PPO | Admitting: Dermatology

## 2023-02-08 ENCOUNTER — Ambulatory Visit (INDEPENDENT_AMBULATORY_CARE_PROVIDER_SITE_OTHER): Payer: PPO | Admitting: Family Medicine

## 2023-02-08 ENCOUNTER — Encounter: Payer: Self-pay | Admitting: Family Medicine

## 2023-02-08 VITALS — BP 130/72 | HR 72 | Temp 97.9°F | Ht 72.0 in | Wt 248.8 lb

## 2023-02-08 DIAGNOSIS — E269 Hyperaldosteronism, unspecified: Secondary | ICD-10-CM | POA: Insufficient documentation

## 2023-02-08 DIAGNOSIS — I1A0 Resistant hypertension: Secondary | ICD-10-CM

## 2023-02-08 DIAGNOSIS — E2609 Other primary hyperaldosteronism: Secondary | ICD-10-CM | POA: Insufficient documentation

## 2023-02-08 NOTE — Assessment & Plan Note (Addendum)
BP is in better control in the office today. Continue lisinopril 20 mg daily, minoxidil 2.5 mg twice daily, labetalol 200 mg twice daily, and spironolactone 50 mg 1 1/2 tabs (75 mg) daily. Continue to follow with [facility].

## 2023-02-08 NOTE — Assessment & Plan Note (Signed)
Recent aldosterone/renin ration was found to be elevated. CT of the abdomen is pending for 12/17 to look for any sign of an adrenal tumor.

## 2023-02-08 NOTE — Progress Notes (Signed)
[facility] [address] [place] [address] Dept: [phone] Dept Fax: [phone]  Chronic Care Office Visit  Subjective:    Patient ID: Jeremy Fox, male    DOB: [birth year], 77 y.o..   MRN: [id number]  Chief Complaint  Patient presents with   Hypertension    2 month f/u HTN.   No concerns.     History of Present Illness:  Patient is in today for reassessment of chronic medical issues.  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 He was admitted at [facility] from 5/10-5/02/2023 and underwent a laparoscopic incisional hernia repair with mesh. His preoperative labs showed a low sodium and low potassium. The hospitalist switched him from Zestoretic 20-25 mg bid to lisinopril 20 mg daily. He continued on minoxidil 2.5 mg daily. His blood pressure control has been worse since then. He had not been able to tolerate amlodipine in the past. I referred him to the [facility]. He is currently managed on lisinopril 20 mg daily, minoxidil 2.5 mg twice daily, labetalol 200 mg twice daily, and spironolactone 50 mg 1 1/2 tabs (75 mg) daily. He has been tracking his BP at home. He notes the pharmacist with the [facility] has noted his home cuff reads his systolic BP 20 mmHg higher than when it is checked manually.  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   Ventral hernia without obstruction or gangrene 06/01/2022   Dermal nevus of parietal region of scalp 06/01/2022   Sensorineural hearing loss (SNHL) of both ears 12/11/2021   Swelling of both lower extremities 11/10/2021   Urinary retention 04/05/2021   Benign prostatic hyperplasia 10/14/2020   Class 1 obesity due to excess calories with serious comorbidity and body mass index  (BMI) of 31.0 to 31.9 in adult 09/18/2020   Onychomycosis of right great toe 06/07/2019   Prominent metatarsal head, right 06/07/2019   S/P arthroscopy of right shoulder 06/05/2019   Chronic pain of both knees 05/08/2019   Chronic right shoulder pain 05/08/2019   Elevated PSA 09/05/2018   Memory loss 09/05/2018   OSA (obstructive sleep apnea) 03/29/2016   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   Dyslipidemia 06/03/2015   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/03/2015   Rotator cuff syndrome of left shoulder 06/03/2015   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   Past Surgical History:  Procedure Laterality Date   CATARACT EXTRACTION W/ INTRAOCULAR LENS IMPLANT Bilateral    INCISIONAL HERNIA REPAIR N/A 07/16/2022   Procedure: LAPAROSCOPIC INCISIONAL HERNIA REPAIR WITH MESH;  Surgeon: Gaynelle Adu, MD;  Location: [facility];  Service: General;  Laterality: N/A;   REPLACEMENT TOTAL KNEE Bilateral    XI ROBOTIC ASSISTED SIMPLE PROSTATECTOMY N/A 06/26/2021   Procedure: XI ROBOTIC ASSISTED SIMPLE PROSTATECTOMY;  Surgeon: Sebastian Ache, MD;  Location: [facility];  Service: Urology;  Laterality: N/A;   Family History  Problem Relation Age of Onset   Heart attack Father 14   Heart disease Father    Diabetes Brother    Outpatient Medications Prior to Visit  Medication Sig Dispense Refill   acetaminophen (TYLENOL) 500 MG tablet Take 1,000 mg by mouth every 6 (six) hours as needed for mild pain.     finasteride (PROSCAR) 5 MG tablet Take 5 mg by mouth daily.  labetalol (NORMODYNE) 100 MG tablet Take 2 tablets (200 mg total) by mouth 2 (two) times daily. 180 tablet 3   lisinopril (ZESTRIL) 20 MG tablet Take 1 tablet (20 mg total) by mouth daily. 90 tablet 3   minoxidil (LONITEN) 2.5 MG tablet Take 1 tablet (2.5 mg total) by mouth 2 (two) times daily. 180 tablet 1   spironolactone (ALDACTONE) 50 MG tablet Take 1.5 tablets (75 mg total) by mouth daily. 45 tablet 12   aspirin EC 81 MG tablet Take 81  mg by mouth daily. Swallow whole. (Patient not taking: Reported on 11/16/2022)     No facility-administered medications prior to visit.   Allergies  Allergen Reactions   Sulfa Antibiotics Rash   Sulfamethoxazole Rash   Objective:   Today's Vitals   02/08/23 0814  BP: 130/72  Pulse: 72  Temp: 97.9 F (36.6 C)  TempSrc: Temporal  SpO2: 100%  Weight: 248 lb 12.8 oz (112.9 kg)  Height: 6' (1.829 m)   Body mass index is 33.74 kg/m.   General: Well developed, well nourished. No acute distress. Psych: Alert and oriented. Normal mood and affect.  There are no preventive care reminders to display for this patient.  Lab Results    Latest Ref Rng & Units 01/27/2023   10:14 AM 12/08/2022    8:39 AM 11/16/2022   10:38 AM  BMP  Glucose 70 - 99 mg/dL 76  98  94   BUN 8 - 27 mg/dL 24  18  16    Creatinine 0.76 - 1.27 mg/dL 7.82  9.56  2.13   BUN/Creat Ratio 10 - 24 19   15    Sodium 134 - 144 mmol/L 135  130  141   Potassium 3.5 - 5.2 mmol/L 5.4  5.0  4.6   Chloride 96 - 106 mmol/L 98  98  106   CO2 20 - 29 mmol/L 21  24  21    Calcium 8.6 - 10.2 mg/dL 9.2  8.9  8.5        Component Ref Range & Units 2 mo ago  Aldosterone 0.0 - 30.0 ng/dL 08.6  Renin Activity, Plasma 0.167 - 5.380 ng/mL/hr 0.601  Aldos/Renin Ratio 0.0 - 30.0 31.6 High   Comment:                          Units:      ng/dL per ng/mL/hr        Assessment & Plan:   Problem List Items Addressed This Visit       Cardiovascular and Mediastinum   Resistant hypertension - Primary    BP is in better control in the office today. Continue lisinopril 20 mg daily, minoxidil 2.5 mg twice daily, labetalol 200 mg twice daily, and spironolactone 50 mg 1 1/2 tabs (75 mg) daily. Continue to follow with [facility].        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 CT of the abdomen is pending for 12/17 to look for any sign of an adrenal tumor.        Return in about 3 months (around 05/09/2023) for Reassessment.   Loyola Mast, MD

## 2023-02-21 ENCOUNTER — Encounter: Payer: Self-pay | Admitting: Pharmacist

## 2023-02-21 ENCOUNTER — Ambulatory Visit: Payer: PPO | Attending: Cardiovascular Disease | Admitting: Pharmacist

## 2023-02-21 VITALS — BP 138/62 | HR 72

## 2023-02-21 DIAGNOSIS — I1A0 Resistant hypertension: Secondary | ICD-10-CM

## 2023-02-21 LAB — BASIC METABOLIC PANEL
BUN/Creatinine Ratio: 20 (ref 10–24)
BUN: 28 mg/dL — ABNORMAL HIGH (ref 8–27)
CO2: 19 mmol/L — ABNORMAL LOW (ref 20–29)
Calcium: 8.9 mg/dL (ref 8.6–10.2)
Chloride: 106 mmol/L (ref 96–106)
Creatinine, Ser: 1.42 mg/dL — ABNORMAL HIGH (ref 0.76–1.27)
Glucose: 87 mg/dL (ref 70–99)
Potassium: 5.7 mmol/L — ABNORMAL HIGH (ref 3.5–5.2)
Sodium: 139 mmol/L (ref 134–144)
eGFR: 50 mL/min/{1.73_m2} — ABNORMAL LOW (ref >59)

## 2023-02-21 NOTE — Patient Instructions (Addendum)
It was nice meeting you two today  We would like your blood pressure to be less than 130/80  Please continue: Spironolactone 75mg  (1 and 1/2 tablets ) once a day Labetalol 200mg   (2 tablets) twice a day Lisinopril 20mg  daily  We will update your blood work today to recheck your potassium levels  Please follow up with Dr [name] Salvia in January  Laural Golden, PharmD, [place], CDCES, CPP [address] [place], [place], [address] Phone: [phone], Fax: [phone]

## 2023-02-21 NOTE — Progress Notes (Signed)
Patient ID: Jeremy Fox                 DOB: 03/25/1945                      MRN: [id number]     HPI: Jeremy Fox is a 77 y.o. male referred by Dr.  [name] Salvia to HTN clinic. PMH is significant for resistant HTN, first degree heart block, OSA, obesity, and elevated aldosterone to renin ratio. This is patient's third PharmD HTN visit.  Patient presents today with significant other. Has been checking BP twice daily since last visit. It was previously noted that home cuff reads approximately 20 points higher than office cuff.  At last visit, spironolactone was increase to 75mg . Patient tolerating well but does not like splitting tablets. Potassium slightly elevated on 01/27/23.  Remains on labetalol, lisinopril, and minoxidil.  Home BP readings (possibly 20 points elevated over office readings):  12/16: 173/83 12/15: 167/78, 165/75 12/14: 179/78, 166/80 12/13: 167/76, 161/71  Current HTN meds:  Spironolactone 75mg  daily Labetalol 200mg  BID Lisinopril 20mg  daily Minoxidil 2.5mg  daily  BP goal: <130/80   Wt Readings from Last 3 Encounters:  02/08/23 248 lb 12.8 oz (112.9 kg)  12/08/22 244 lb (110.7 kg)  11/16/22 248 lb 8 oz (112.7 kg)   BP Readings from Last 3 Encounters:  02/08/23 130/72  01/17/23 (!) 178/91  12/16/22 (!) 176/80   Pulse Readings from Last 3 Encounters:  02/08/23 72  01/17/23 90  12/16/22 66    Renal function: CrCl cannot be calculated (Patient's most recent lab result is older than the maximum 21 days allowed.).  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     aspirin EC 81 MG tablet Take 81 mg by mouth daily. Swallow whole. (Patient not taking: Reported on 11/16/2022)     finasteride (PROSCAR) 5 MG tablet Take 5 mg by mouth daily.     labetalol (NORMODYNE) 100 MG tablet Take 2 tablets (200 mg total) by mouth 2 (two) times daily. 180 tablet 3   lisinopril (ZESTRIL) 20 MG tablet Take 1 tablet (20 mg total) by mouth daily. 90 tablet 3   minoxidil (LONITEN) 2.5 MG tablet Take 1 tablet (2.5 mg total) by mouth 2 (two) times daily. 180 tablet 1   spironolactone (ALDACTONE) 50 MG tablet Take 1.5 tablets (75 mg total) by mouth daily. 45 tablet 12   No current facility-administered medications on file prior to visit.    Allergies  Allergen Reactions   Sulfa Antibiotics Rash   Sulfamethoxazole Rash     Assessment/Plan:  1. Hypertension -  Patient BP in room 138/62 but which is above goal of <130/80 but much improved over previous. Concern regarding patient;s potassium level. Will update BMP today to see if levels  remain elevated. If so, may need to reduce back to 50mg . Has follow up appt scheduled with Dr [name] Salvia for 1/22.  Continue:  Spironolactone 75mg  daily Labetalol 200mg  BID Lisinopril 20mg  daily Minoxidil 2.5mg  daily F/u with Dr [name] Salvia in 4 weeks  Laural Golden, PharmD, BCACP, CDCES, CPP [address] [place], [place], [address] Phone: [phone], Fax: [phone]

## 2023-02-22 ENCOUNTER — Other Ambulatory Visit (HOSPITAL_BASED_OUTPATIENT_CLINIC_OR_DEPARTMENT_OTHER): Payer: Self-pay | Admitting: Cardiovascular Disease

## 2023-02-22 ENCOUNTER — Telehealth: Payer: Self-pay | Admitting: Pharmacist

## 2023-02-22 ENCOUNTER — Ambulatory Visit (HOSPITAL_BASED_OUTPATIENT_CLINIC_OR_DEPARTMENT_OTHER)
Admission: RE | Admit: 2023-02-22 | Discharge: 2023-02-22 | Disposition: A | Discharge status: Home / Self Care | Payer: PPO | Source: Ambulatory Visit | Attending: Cardiovascular Disease | Admitting: Cardiovascular Disease

## 2023-02-22 DIAGNOSIS — K429 Umbilical hernia without obstruction or gangrene: Secondary | ICD-10-CM | POA: Diagnosis not present

## 2023-02-22 DIAGNOSIS — I700 Atherosclerosis of aorta: Secondary | ICD-10-CM | POA: Diagnosis not present

## 2023-02-22 DIAGNOSIS — I1A0 Resistant hypertension: Secondary | ICD-10-CM

## 2023-02-22 MED ORDER — IOHEXOL 300 MG/ML  SOLN
100.0000 mL | Freq: Once | INTRAMUSCULAR | Status: DC | PRN
Start: 2023-02-22 — End: 2023-02-23

## 2023-02-22 NOTE — Telephone Encounter (Signed)
Potassium and Scr continue to increase since increase in spiro. Messaged patient to hold and recheck BMP in 1 week

## 2023-03-16 NOTE — Progress Notes (Signed)
 HPI M never smoker followed for OSA, complicated by HTN, Ventricular Bigeminy, BPH, Obesity, Dyslipidemia, NPSG 02/11/16- AHI 44.2/ hr, desaturation to 72%, body weight 222 lbs =============================================================================================================   03/15/22- 76 yoM never smoker followed for OSA, complicated by HTN, Ventricular Bigeminy, BPH, Obesity, Dyslipidemia, CPAP auto 10-20/ [facility]    replaced 12/24/21    AirSense 11 Download compliance-97%, AHI 1.5/ hr Body weight today-243 lbs Covid vax- Flu vax-                                                                        Wife here He is pleased with his new AirSense 11 machine and with DME change to [facility]. Download reviewed. Hearing better with current hearing aids. Implies this puts him in better mood.  He is happy also with BP control and credits Dr Thedora for changing his BP med.  03/17/23-  77 yoM never smoker followed for OSA, complicated by HTN, Ventricular Bigeminy, BPH, Obesity, Dyslipidemia, CPAP auto 10-20/ [facility]    replaced 12/24/21    AirSense 11 Download compliance-70%, AHI 2.9/hr  Body weight today-256 lbs Discussed the use of AI scribe software for clinical note transcription with the patient, who gave verbal consent to proceed.  History of Present Illness   The patient, with a history of sleep apnea managed with CPAP, reports recent weight gain to 256.4 lbs. He acknowledges the weight gain is not a positive development. He reports no issues with breathing and his oxygen saturation was recently measured at 99%. He has been using his CPAP machine every night, but occasionally not meeting the recommended minimum of four hours per night, six nights per week. He attributes this to a recent knee problem and ankle injury, which occurred when he jumped off the back of a truck. The ankle injury has prevented him from attending the gym, contributing to his weight gain. He believes the ankle  needs time to heal and has not sought medical attention for it. He also has knee replacements, which he reports are affected by cold weather. Despite these issues, he reports that his sleep has improved with the CPAP machine and he would not want to be without it.     ROS-see HPI   += positive Constitutional:    weight loss, night sweats, fevers, chills, fatigue, lassitude. HEENT:    headaches, difficulty swallowing, tooth/dental problems, sore throat,       sneezing, itching, ear ache, nasal congestion, post nasal drip, snoring CV:    chest pain, orthopnea, PND, +swelling in lower extremities, anasarca,                                   dizziness, palpitations Resp:   shortness of breath with exertion or at rest.                productive cough,   non-productive cough, coughing up of blood.              change in color of mucus.  wheezing.   Skin:    rash or lesions. GI:  No-   heartburn, indigestion, abdominal pain, nausea, vomiting, diarrhea,  change in bowel habits, loss of appetite GU: dysuria, change in color of urine, no urgency or frequency.   flank pain. MS:  + joint pain, stiffness, decreased range of motion, back pain. Neuro-     nothing unusual Psych:  change in mood or affect.  depression or anxiety.   memory loss.   OBJ- Physical Exam General- Alert, Oriented, Affect-appropriate, Distress- none acute, +overweight Skin- rash-none, lesions- none, excoriation- none Lymphadenopathy- none Head- atraumatic            Eyes- Gross vision intact, PERRLA, conjunctivae and secretions clear            Ears- +Hearing aids            Nose- Clear, no-Septal dev, mucus, polyps, erosion, perforation             Throat- Mallampati III-IV , mucosa clear , drainage- none, tonsils- atrophic, +teeth Neck- flexible , trachea midline, no stridor , thyroid  nl, carotid no bruit Chest - symmetrical excursion , unlabored           Heart/CV- RRR , no murmur , no gallop  , no rub, nl s1  s2                           - JVD- none , edema +2 bilat, stasis changes- none, varices- none           Lung- clear to P&A, wheeze- none, cough- none , dullness-none, rub- none           Chest wall-  Abd-  Br/ Gen/ Rectal- Not done, not indicated Extrem- cyanosis- none, clubbing, none, atrophy- none, strength- nl Neuro- grossly intact to observation  Assessment and Plan    Obstructive Sleep Apnea Patient reports improved quality of life with CPAP use. However, inconsistent use with some nights less than 4 hours. No issues with comfort or fit reported. -Encourage consistent use of CPAP for at least 4 hours per night, 6 nights per week.  Weight Gain Patient reports recent weight gain, currently at 256.4 lbs. No reported breathing difficulties. -Encourage healthy diet and exercise as tolerated.  Ankle Injury Patient reports a fall from a truck bed approximately one month ago resulting in ankle pain. No medical attention sought. -Recommend evaluation if pain persists or worsens.  Knee Pain Patient reports weather-related exacerbation of pain in knees with previous replacements. -Continue current pain management strategies.  Follow-up in 1 year unless issues arise sooner.

## 2023-03-17 ENCOUNTER — Ambulatory Visit: Payer: PPO | Admitting: Internal Medicine

## 2023-03-17 ENCOUNTER — Encounter: Payer: Self-pay | Admitting: Internal Medicine

## 2023-03-17 VITALS — BP 130/70 | HR 68 | Ht 72.0 in | Wt 256.8 lb

## 2023-03-17 DIAGNOSIS — G4733 Obstructive sleep apnea (adult) (pediatric): Secondary | ICD-10-CM | POA: Diagnosis not present

## 2023-03-17 NOTE — Patient Instructions (Signed)
We can continue CPAP auto 10-20  Please call if we can help 

## 2023-03-22 ENCOUNTER — Encounter: Payer: Self-pay | Admitting: Family Medicine

## 2023-03-22 ENCOUNTER — Telehealth (HOSPITAL_BASED_OUTPATIENT_CLINIC_OR_DEPARTMENT_OTHER): Payer: Self-pay | Admitting: *Deleted

## 2023-03-22 DIAGNOSIS — E278 Other specified disorders of adrenal gland: Secondary | ICD-10-CM | POA: Insufficient documentation

## 2023-03-22 MED ORDER — HYDROCHLOROTHIAZIDE 25 MG PO TABS: 25.0000 mg | ORAL_TABLET | Freq: Every day | ORAL | 3 refills | Status: DC

## 2023-03-22 NOTE — Telephone Encounter (Signed)
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  This means that they produce too much aldosterone and why is it so hard to control your blood pressure.  Spironolactone  helps to control the overactivity of the adrenal glands.  Recommend that you increase spironolactone  to 100mg .  Stop lisinopril .  Start hydrochlorothiazide  25mg  daily. That way your potassium won't get too high.  We will check labs at your follow up next week.

## 2023-03-22 NOTE — Telephone Encounter (Signed)
 Advised patient, verbalized understanding

## 2023-03-30 ENCOUNTER — Encounter (HOSPITAL_BASED_OUTPATIENT_CLINIC_OR_DEPARTMENT_OTHER): Payer: Self-pay | Admitting: Cardiovascular Disease

## 2023-03-30 ENCOUNTER — Ambulatory Visit (HOSPITAL_BASED_OUTPATIENT_CLINIC_OR_DEPARTMENT_OTHER): Payer: PPO | Admitting: Cardiovascular Disease

## 2023-03-30 ENCOUNTER — Ambulatory Visit (HOSPITAL_BASED_OUTPATIENT_CLINIC_OR_DEPARTMENT_OTHER): Payer: PPO

## 2023-03-30 ENCOUNTER — Encounter (HOSPITAL_BASED_OUTPATIENT_CLINIC_OR_DEPARTMENT_OTHER): Payer: Self-pay | Admitting: Student

## 2023-03-30 ENCOUNTER — Ambulatory Visit (HOSPITAL_BASED_OUTPATIENT_CLINIC_OR_DEPARTMENT_OTHER): Payer: PPO | Admitting: Student

## 2023-03-30 VITALS — BP 140/68 | HR 65 | Ht 72.0 in | Wt 247.8 lb

## 2023-03-30 DIAGNOSIS — S92155A Nondisplaced avulsion fracture (chip fracture) of left talus, initial encounter for closed fracture: Secondary | ICD-10-CM

## 2023-03-30 DIAGNOSIS — G4733 Obstructive sleep apnea (adult) (pediatric): Secondary | ICD-10-CM | POA: Diagnosis not present

## 2023-03-30 DIAGNOSIS — E278 Other specified disorders of adrenal gland: Secondary | ICD-10-CM

## 2023-03-30 DIAGNOSIS — Z5181 Encounter for therapeutic drug level monitoring: Secondary | ICD-10-CM

## 2023-03-30 DIAGNOSIS — M25572 Pain in left ankle and joints of left foot: Secondary | ICD-10-CM

## 2023-03-30 DIAGNOSIS — E2609 Other primary hyperaldosteronism: Secondary | ICD-10-CM

## 2023-03-30 DIAGNOSIS — I1A0 Resistant hypertension: Secondary | ICD-10-CM | POA: Diagnosis not present

## 2023-03-30 MED ORDER — LABETALOL HCL 200 MG PO TABS: 200.0000 mg | ORAL_TABLET | Freq: Two times a day (BID) | ORAL | 3 refills | Status: DC

## 2023-03-30 NOTE — Progress Notes (Signed)
Chief Complaint: Left ankle pain     History of Present Illness:    Jeremy Fox is a 78 y.o. male presenting today for evaluation of left ankle pain.  About 6 weeks ago he had an injury to this ankle while stepping off the back of a pickup truck.  He is unsure if he twisted the ankle or sustain direct contact.  He does report being unable to walk shortly after.  Overall his symptoms have improved some since the injury however he continues to have pain with weightbearing that he rates as moderate to severe.  Pain is located mainly over the outside of the ankle and he does have some residual bruising.  No recent falls.  Has tried using a compression sock.   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   Past Surgical History:  Procedure Laterality Date   CATARACT EXTRACTION W/ INTRAOCULAR LENS IMPLANT Bilateral    INCISIONAL HERNIA REPAIR N/A 07/16/2022   Procedure: LAPAROSCOPIC INCISIONAL HERNIA REPAIR WITH MESH;  Surgeon: Gaynelle Adu, MD;  Location: [facility];  Service: General;  Laterality: N/A;   REPLACEMENT TOTAL KNEE Bilateral    XI ROBOTIC ASSISTED SIMPLE PROSTATECTOMY N/A 06/26/2021   Procedure: XI ROBOTIC ASSISTED SIMPLE PROSTATECTOMY;  Surgeon: Sebastian Ache, MD;  Location: [facility];  Service: Urology;  Laterality: N/A;   Social History   Socioeconomic History   Marital status: Significant Other    Spouse name:  Not on file   Number of children: 2   Years of education: Not on file   Highest education level: Not on file  Occupational History   Occupation: Retired    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[employer]  Tobacco Use   Smoking status: Never    Passive exposure: Never   Smokeless tobacco: Never  Vaping Use   Vaping status: Never Used  Substance and Sexual Activity   Alcohol use: Never   Drug use: No   Sexual activity: Yes  Other Topics Concern   Not on file  Social History Narrative   Not on file   Social Drivers of Health   Financial Resource Strain: Not on file  Food Insecurity: No Food Insecurity (07/16/2022)   Hunger Vital Sign    Worried About Running Out of Food in the Last Year: Never true    Ran Out of Food in the Last Year: Never true  Transportation Needs: No Transportation Needs (07/16/2022)   PRAPARE - Administrator, Civil Service (Medical): No    Lack of Transportation (Non-Medical): No  Physical Activity: Inactive (11/16/2022)   Exercise Vital Sign    Days of Exercise per Week: 0 days    Minutes of Exercise per Session: 0 min  Stress: Not on file  Social Connections:  Not on file   Family History  Problem Relation Age of Onset   Heart attack Father 3   Heart disease Father    Diabetes Brother    Allergies  Allergen Reactions   Sulfa Antibiotics Rash   Sulfamethoxazole Rash   Current Outpatient Medications  Medication Sig Dispense Refill   aspirin EC 81 MG tablet Take 81 mg by mouth daily. Swallow whole. (Patient not taking: Reported on 03/30/2023)     finasteride (PROSCAR) 5 MG tablet Take 5 mg by mouth daily.     hydrochlorothiazide (HYDRODIURIL) 25 MG tablet Take 1 tablet (25 mg total) by mouth daily. 90 tablet 3   labetalol (NORMODYNE) 200 MG tablet Take 1 tablet (200 mg total) by mouth 2 (two) times daily. 180 tablet 3   minoxidil (LONITEN) 2.5 MG tablet Take 1 tablet (2.5 mg total) by mouth 2 (two) times daily. 180 tablet 1    spironolactone (ALDACTONE) 50 MG tablet Take 100 mg by mouth daily.     No current facility-administered medications for this visit.   No results found.  Review of Systems:   A ROS was performed including pertinent positives and negatives as documented in the HPI.  Physical Exam :   Constitutional: NAD and appears stated age Neurological: Alert and oriented Psych: Appropriate affect and cooperative There were no vitals taken for this visit.   Comprehensive Musculoskeletal Exam:    Left ankle exam demonstrates tenderness palpation over the lateral malleolus and lateral talar process.  Mild diffuse ecchymosis over these areas.  Active range of motion to 20 degrees dorsiflexion and plantarflexion.  Negative Thompson and anterior drawer test.  Dorsalis pedis pulse 2+ with brisk capillary refill to all 5 toes.  Imaging:   Xray (left ankle 3 views): Small avulsion off of the lateral talus with minimal displacement but otherwise no evidence of acute fracture or dislocation.  Plantar calcaneal spur.   I personally reviewed and interpreted the radiographs.  Assessment:   78 y.o. male now 6 weeks status post injury to his left ankle.  He is having residual pain particularly with weightbearing.  X-rays today do demonstrate evidence of a small avulsion off the lateral talus.  I did discuss this pathology in detail and it is consistent with a sprain mechanism.  Will plan to treat accordingly and I have recommended options for an ankle brace which I believe he will tolerate much better than a boot.  He will continue to monitor symptoms and could consider future referral to PT if needed.  Plan :    - Return to clinic as needed     I personally saw and evaluated the patient, and participated in the management and treatment plan.  Hazle Nordmann, PA-C Orthopedics

## 2023-03-30 NOTE — Patient Instructions (Addendum)
Medication Instructions:  Your physician recommends that you continue on your current medications as directed. Please refer to the Current Medication list given to you today.  Labwork: BMET TODAY   Testing/Procedures: NONE  Follow-Up: 4 MONTHS   Any Other Special Instructions Will Be Listed Below (If Applicable). CONTINUE TO MONITOR YOUR BLOOD PRESSURE AND LOG. BRING READINGS TO FOLLOW UP   STOP AT ORTHOPEDIC CHECK OUT AND GET YOURSELF AN APPOINTMENT   If you need a refill on your cardiac medications before your next appointment, please call your pharmacy.

## 2023-03-30 NOTE — Progress Notes (Signed)
Advanced Hypertension Clinic Follow Up:    Date:  03/30/2023   ID:  NIKOLAY Fox, DOB [birth year], MRN [id number]  PCP:  Loyola Mast, MD  Cardiologist:  None  Nephrologist:  Referring MD: Loyola Mast, MD   CC: Hypertension  History of Present Illness:    Jeremy Fox is a 78 y.o. male with a hx of hypertension, hyperlipidemia, OSA, here for follow up.  He first established care in the [facility]pert[facility] Clinic 11/2022. He last saw Dr. Bing Matter 11/2021 and blood pressure was 134/86. He had some LE edema and amlodipine was thought to be contributing. He was admitted for hernia repair 07/2022. He was switched from lisinopril/HCTZ to just lisinopril daily due to low potassium. He remained on minoxidil and spironolactone. His PCP switched him from carvedilol to labetalol and blood pressures remained elevated, so he was referred to the Advanced Hypertension Clinic.  At his initial visit his blood pressure was in the 180s over 70s.  This was in the setting of being out of spironolactone for 1-2 weeks.   Labs were concerning for hyperaldosteronism.  He enrolled in our remote patient monitoring study.  He followed up with our pharmacist and minoxidil was increased.  Blood pressure remained uncontrolled and lisinopril was decreased so that spironolactone could be increased 01/2023.  He became hyperkalemic and spironolactone was discontinued.  He had a CT of the abdomen that was consistent with bilateral adrenal hyperplasia without any evidence of adrenal adenomas.  Therefore spironolactone was resumed and HCTZ was added.  [name] presents with a history of multiple surgical interventions, including bilateral knee replacements and prostate surgery. He reports that cold weather exacerbates discomfort in the knees, likely due to the metallic implants. The patient also reports a history of urinary issues, including urinary retention that necessitated the use of a urinary bag. He underwent  surgery to address this issue, which has since resolved.  [name] also reports an unintentional self-inflicted injury resulting from the misuse of a hammer drill, which led to a hernia and subsequent surgical repair. His blood pressure has been difficult to control since the hernia operation. Lab work indicates that the patient's adrenal glands are overactive, producing excess aldosterone, which may be contributing to the patient's hypertension. He reports fatigue and excessive sleep, despite sleeping well at night and using his CPAP faithfully.  [name] has been experiencing difficulty with exercise due to knee discomfort and a recent ankle injury. The patient reports a lack of energy and weight gain, likely due to decreased physical activity. The patient was previously attending a gym three days a week but has been unable to continue due to the ankle injury. The patient reports that the ankle injury occurred when jumping from a truck onto concrete. The patient has not sought medical attention for the ankle injury and is hoping it will improve with time.     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    Past  Surgical History:  Procedure Laterality Date   CATARACT EXTRACTION W/ INTRAOCULAR LENS IMPLANT Bilateral    INCISIONAL HERNIA REPAIR N/A 07/16/2022   Procedure: LAPAROSCOPIC INCISIONAL HERNIA REPAIR WITH MESH;  Surgeon: Gaynelle Adu, MD;  Location: [facility];   Service: General;  Laterality: N/A;   REPLACEMENT TOTAL KNEE Bilateral    XI ROBOTIC ASSISTED SIMPLE PROSTATECTOMY N/A 06/26/2021   Procedure: XI ROBOTIC ASSISTED SIMPLE PROSTATECTOMY;  Surgeon: Sebastian Ache, MD;  Location: [facility];  Service: Urology;  Laterality: N/A;    Current Medications: Current Meds  Medication Sig   finasteride (PROSCAR) 5 MG tablet Take 5 mg by mouth daily.   hydrochlorothiazide (HYDRODIURIL) 25 MG tablet Take 1 tablet (25 mg total) by mouth daily.   minoxidil (LONITEN) 2.5 MG tablet Take 1 tablet (2.5 mg total) by mouth 2 (two) times daily.   spironolactone (ALDACTONE) 50 MG tablet Take 100 mg by mouth daily.   [DISCONTINUED] labetalol (NORMODYNE) 100 MG tablet Take 2 tablets (200 mg total) by mouth 2 (two) times daily.     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[employer]  Tobacco Use   Smoking status: Never    Passive exposure: Never   Smokeless tobacco: Never  Vaping Use   Vaping status: Never Used  Substance and Sexual Activity   Alcohol use: Never   Drug use: No   Sexual activity: Yes  Other Topics Concern   Not on file  Social History Narrative   Not on file   Social Drivers of Health   Financial Resource Strain: Not on file  Food Insecurity: No Food Insecurity (07/16/2022)   Hunger Vital Sign    Worried About Running Out of Food in the Last Year: Never true    Ran Out of Food in the Last Year: Never true  Transportation Needs: No Transportation Needs (07/16/2022)   PRAPARE - Administrator, Civil Service (Medical): No    Lack of Transportation (Non-Medical): No  Physical Activity: Inactive (11/16/2022)   Exercise Vital Sign     Days of Exercise per Week: 0 days    Minutes of Exercise per Session: 0 min  Stress: Not on file  Social Connections: Not on file     Family History: The patient's family history includes Diabetes in his brother; Heart attack (age of onset: 2) in his father; Heart disease in his father.  ROS:   Please see the history of present illness. All other systems reviewed and are negative.  EKGs/Labs/Other Studies Reviewed:    Eugenie Birks Stress Test  10/22/2020: Nuclear stress EF: 66%. The left ventricular ejection fraction is hyperdynamic (>65%). There was no ST segment deviation noted during stress. No T wave inversion was noted during stress. The study is normal. This is a low risk study.  EKG:  EKG is personally reviewed. 11/16/2022: Not ordered.  Recent Labs: 07/17/2022: Magnesium 1.9 07/18/2022: Hemoglobin 12.8; Platelets 195 02/21/2023: BUN 28; Creatinine, Ser 1.42; Potassium 5.7; Sodium 139   Recent Lipid Panel    Component Value Date/Time   CHOL 144 04/06/2021 0121   CHOL 173 05/01/2018 0803   TRIG 61 04/06/2021 0121  HDL 45 04/06/2021 0121   HDL 49 05/01/2018 0803   CHOLHDL 3.2 04/06/2021 0121   VLDL 12 04/06/2021 0121   LDLCALC 87 04/06/2021 0121   LDLCALC 109 (H) 05/01/2018 0803    Physical Exam:    VS:  BP (!) 140/68   Pulse 65   Ht 6' (1.829 m)   Wt 247 lb 12.8 oz (112.4 kg)   SpO2 100%   BMI 33.61 kg/m  , BMI Body mass index is 33.61 kg/m. GENERAL:  Well appearing HEENT: Pupils equal round and reactive, fundi not visualized, oral mucosa unremarkable NECK:  No jugular venous distention, waveform within normal limits, carotid upstroke brisk and symmetric, LUNGS:  Clear to auscultation bilaterally HEART:  RRR.  PMI not displaced or sustained, S1 and S2 within normal limits, no S3, no S4, no clicks, no rubs, no murmurs ABD:  Flat, positive bowel sounds normal in frequency in pitch, no bruits, no rebound, no guarding, no midline pulsatile mass, no hepatomegaly,  no splenomegaly EXT:  2 plus pulses throughout, 1+ LE pitting edema, no cyanosis, no clubbing SKIN:  No rashes, no nodules NEURO:  Cranial nerves II through XII grossly intact, motor grossly intact throughout PSYCH:  Cognitively intact, oriented to person place and time   ASSESSMENT/PLAN:       # Primary Hyperaldosteronism Conn's syndrome.  Adrenal glands producing excess aldosterone leading to hypertension and hypokalemia. Patient reports fatigue and excessive sleep. Currently on Spironolactone 100mg daily.  Not a candidate for surgery given bilateral disease. -Check basic metabolic panel today to monitor potassium levels and other electrolytes.  # Hypertension Blood pressure readings at home are variable, with some readings in the 140s-170s systolic range in the morning and 130s-160s in the evening. Blood pressure in the office today was 140/62. -Continue current antihypertensive regimen.  Change labetalolol to 200mg  tablets. -Check blood pressure a couple of times a week at home.  # Ankle Injury Patient reports a recent ankle injury that has limited his ability to exercise. -Refer to orthopedics for evaluation and management of ankle injury.  # hyperlipidemia:  Labs to be checked when he sees Dr. Veto Kemps in March.   General Health Maintenance -Continue using CPAP machine for sleep apnea. -Encourage resumption of gym exercise once ankle injury is resolved. -Follow-up in four months.      Screening for Secondary Hypertension:     11/16/2022    9:40 AM  Causes  Drugs/Herbals Screened     - Comments rare ibuprofen.  Higher use in past.  Occasional caffeine. Rare EtOH.  Renovascular HTN Screened     - Comments check renal artery Dopplers  Sleep Apnea Screened     - Comments uses CPAP regularly  Thyroid Disease Screened  Hyperaldosteronism Screened     - Comments Check renin/aldosterone  Pheochromocytoma N/A  Cushing's Syndrome N/A  Hyperparathyroidism Screened  Coarctation  of the Aorta Screened     - Comments BP symmetric  Compliance Screened    Relevant Labs/Studies:    Latest Ref Rng & Units 02/21/2023   10:28 AM 01/27/2023   10:14 AM 12/08/2022    8:39 AM  Basic Labs  Sodium 134 - 144 mmol/L 139  135  130   Potassium 3.5 - 5.2 mmol/L 5.7  5.4  5.0   Creatinine 0.76 - 1.27 mg/dL 1.61  0.96  0.45        Latest Ref Rng & Units 09/05/2018    2:30 PM  Thyroid   TSH 0.40 - 4.50  mIU/L 1.20     Disposition:    FU with APP/PharmD in 1 month for the next 3 months.   FU with Danyelle Brookover C. [name] Salvia, MD, [facility] in 4 months.  Medication Adjustments/Labs and Tests Ordered: Current medicines are reviewed at length with the patient today.  Concerns regarding medicines are outlined above.   Orders Placed This Encounter  Procedures   Basic metabolic panel   Meds ordered this encounter  Medications   labetalol (NORMODYNE) 200 MG tablet    Sig: Take 1 tablet (200 mg total) by mouth 2 (two) times daily.    Dispense:  180 tablet    Refill:  3    NEW DOSE, D/C 100 MG RX     Signed, Chilton Si, MD  03/30/2023 11:39 AM    [facility]

## 2023-03-31 ENCOUNTER — Telehealth: Payer: Self-pay | Admitting: Student in an Organized Health Care Education/Training Program

## 2023-03-31 ENCOUNTER — Telehealth: Payer: Self-pay | Admitting: Cardiovascular Disease

## 2023-03-31 LAB — BASIC METABOLIC PANEL
BUN/Creatinine Ratio: 21 (ref 10–24)
BUN: 38 mg/dL — ABNORMAL HIGH (ref 8–27)
CO2: 20 mmol/L (ref 20–29)
Calcium: 9.2 mg/dL (ref 8.6–10.2)
Chloride: 96 mmol/L (ref 96–106)
Creatinine, Ser: 1.78 mg/dL — ABNORMAL HIGH (ref 0.76–1.27)
Glucose: 92 mg/dL (ref 70–99)
Potassium: 6.2 mmol/L (ref 3.5–5.2)
Sodium: 132 mmol/L — ABNORMAL LOW (ref 134–144)
eGFR: 38 mL/min/{1.73_m2} — ABNORMAL LOW (ref >59)

## 2023-03-31 NOTE — Telephone Encounter (Signed)
Called and spoke to pt's son. Discussed lab values and importance of stopping hydrochlorothiazide and spiro until further review from Dr. [name] Salvia. He verbalized understanding.

## 2023-03-31 NOTE — Telephone Encounter (Signed)
Patient's son states he is returning a call. He thinks it was regarding lab work.

## 2023-03-31 NOTE — Telephone Encounter (Addendum)
Received signout from fellow that he received [facility] call overnight regarding hyperkalemia. He did not contact patient in case of erroneous level. Labs reviewed, K 6.2, Cr 1.78, uptrending from previous. I reached out to patient x2 but got VM. Work number does not work. Left detailed message on home number to stop hydrochlorothiazide and spironolactone pending further review from Dr. [name] Salvia, asked him to return call when he got the message. I also tried to call his son on [place] but got VM. Left message for him to return call as well. Sent secure chat to Dr. [name] Salvia for cross cover, will also cc this msg to her.

## 2023-03-31 NOTE — Telephone Encounter (Signed)
I was notified by [facility] that the patient has a K level of 6.2. I notified the day time APP of this result and she will follow up with the patent regarding next steps.

## 2023-04-01 ENCOUNTER — Telehealth (HOSPITAL_BASED_OUTPATIENT_CLINIC_OR_DEPARTMENT_OTHER): Payer: Self-pay | Admitting: *Deleted

## 2023-04-01 ENCOUNTER — Encounter (HOSPITAL_BASED_OUTPATIENT_CLINIC_OR_DEPARTMENT_OTHER): Payer: Self-pay | Admitting: *Deleted

## 2023-04-01 DIAGNOSIS — E875 Hyperkalemia: Secondary | ICD-10-CM

## 2023-04-01 DIAGNOSIS — I10 Essential (primary) hypertension: Secondary | ICD-10-CM

## 2023-04-01 MED ORDER — LOKELMA 10 G PO PACK: 10.0000 g | PACK | Freq: Once | ORAL | 0 refills | Status: AC

## 2023-04-01 NOTE — Telephone Encounter (Signed)
Advised patient, verbalized understanding

## 2023-04-01 NOTE — Telephone Encounter (Signed)
-----   Message from [facility] sent at 03/31/2023  9:53 AM EST ----- Please have him hold spironolactone.   Give a dose of lokelma 10g.  Check BMP in 2 days.

## 2023-04-01 NOTE — Telephone Encounter (Signed)
Patient is returning call. Transferred to [place], LPN.

## 2023-04-01 NOTE — Telephone Encounter (Signed)
Left message to call back at contact number and for son to call back  Per phone note yesterday Spironolactone and hydrochlorothiazide to be held

## 2023-04-04 LAB — BASIC METABOLIC PANEL
BUN/Creatinine Ratio: 22 (ref 10–24)
BUN: 41 mg/dL — ABNORMAL HIGH (ref 8–27)
CO2: 18 mmol/L — ABNORMAL LOW (ref 20–29)
Calcium: 8.9 mg/dL (ref 8.6–10.2)
Chloride: 98 mmol/L (ref 96–106)
Creatinine, Ser: 1.84 mg/dL — ABNORMAL HIGH (ref 0.76–1.27)
Glucose: 99 mg/dL (ref 70–99)
Potassium: 5 mmol/L (ref 3.5–5.2)
Sodium: 133 mmol/L — ABNORMAL LOW (ref 134–144)
eGFR: 37 mL/min/{1.73_m2} — ABNORMAL LOW (ref >59)

## 2023-04-07 ENCOUNTER — Telehealth (HOSPITAL_BASED_OUTPATIENT_CLINIC_OR_DEPARTMENT_OTHER): Payer: Self-pay | Admitting: *Deleted

## 2023-04-07 NOTE — Telephone Encounter (Signed)
Advised patient, verbalized understanding

## 2023-04-07 NOTE — Telephone Encounter (Signed)
-----   Message from [facility] sent at 04/06/2023  1:04 PM EST ----- Recent labs were reviewed.  His potassium has gotten back to normal.  Recommend increasing minoxidil to 5 mg twice a day.  Okay to restart the hydrochlorothiazide 25 mg daily.  Continue to hold the spironolactone.  Keep tracking blood pressures.  Lets please have him follow-up with a pharmacist or APP within a month for blood pressure check and BMP.  Tiffany C. [name] Salvia, MD, [facility]

## 2023-04-08 ENCOUNTER — Telehealth: Payer: Self-pay | Admitting: Cardiovascular Disease

## 2023-04-08 MED ORDER — MINOXIDIL 2.5 MG PO TABS: 5.0000 mg | ORAL_TABLET | Freq: Two times a day (BID) | ORAL | 3 refills | Status: DC

## 2023-04-08 NOTE — Telephone Encounter (Signed)
 Refilled as requested

## 2023-04-08 NOTE — Telephone Encounter (Signed)
Pt is requesting a refill on medication minoxidil. Would Dr. [name] Salvia like to refill this medication? Please address

## 2023-04-08 NOTE — Telephone Encounter (Signed)
*  STAT* If patient is at the pharmacy, call can be transferred to refill team.   1. Which medications need to be refilled? (please list name of each medication and dose if known) minoxidil (LONITEN) 2.5 MG tablet    4. Which pharmacy/location (including street and city if local pharmacy) is medication to be sent to? [id number] - [place], [place] - [address] Phone: [phone]  Fax: [phone]     5. Do they need a 30 day or 90 day supply? 90

## 2023-05-09 ENCOUNTER — Encounter: Payer: Self-pay | Admitting: Family Medicine

## 2023-05-09 ENCOUNTER — Ambulatory Visit (INDEPENDENT_AMBULATORY_CARE_PROVIDER_SITE_OTHER): Payer: PPO | Admitting: Family Medicine

## 2023-05-09 VITALS — BP 138/76 | HR 63 | Temp 98.7°F | Ht 72.0 in | Wt 250.8 lb

## 2023-05-09 DIAGNOSIS — E785 Hyperlipidemia, unspecified: Secondary | ICD-10-CM

## 2023-05-09 DIAGNOSIS — I1A0 Resistant hypertension: Secondary | ICD-10-CM | POA: Diagnosis not present

## 2023-05-09 DIAGNOSIS — E875 Hyperkalemia: Secondary | ICD-10-CM

## 2023-05-09 DIAGNOSIS — M7989 Other specified soft tissue disorders: Secondary | ICD-10-CM

## 2023-05-09 DIAGNOSIS — N400 Enlarged prostate without lower urinary tract symptoms: Secondary | ICD-10-CM

## 2023-05-09 DIAGNOSIS — E2609 Other primary hyperaldosteronism: Secondary | ICD-10-CM | POA: Diagnosis not present

## 2023-05-09 LAB — BASIC METABOLIC PANEL
BUN: 29 mg/dL — ABNORMAL HIGH (ref 6–23)
CO2: 24 meq/L (ref 19–32)
Calcium: 8.6 mg/dL (ref 8.4–10.5)
Chloride: 99 meq/L (ref 96–112)
Creatinine, Ser: 1.51 mg/dL — ABNORMAL HIGH (ref 0.40–1.50)
GFR: 43.49 mL/min — ABNORMAL LOW (ref >60.00)
Glucose, Bld: 94 mg/dL (ref 70–99)
Potassium: 5 meq/L (ref 3.5–5.1)
Sodium: 132 meq/L — ABNORMAL LOW (ref 135–145)

## 2023-05-09 LAB — LIPID PANEL
Cholesterol: 146 mg/dL (ref 0–200)
HDL: 41.8 mg/dL (ref >39.00)
LDL Cholesterol: 86 mg/dL (ref 0–99)
NonHDL: 104.15
Total CHOL/HDL Ratio: 3
Triglycerides: 89 mg/dL (ref 0.0–149.0)
VLDL: 17.8 mg/dL (ref 0.0–40.0)

## 2023-05-09 MED ORDER — FUROSEMIDE 20 MG PO TABS: 20.0000 mg | ORAL_TABLET | Freq: Every day | ORAL | 3 refills | Status: DC | PRN

## 2023-05-09 MED ORDER — FINASTERIDE 5 MG PO TABS: 5.0000 mg | ORAL_TABLET | Freq: Every day | ORAL | 3 refills | Status: AC

## 2023-05-09 NOTE — Assessment & Plan Note (Signed)
Stable.  Continue finasteride 5 mg daily. 

## 2023-05-09 NOTE — Assessment & Plan Note (Addendum)
 BP is in adequate control in the office today. His home BPs look much higher, but there has been some question as to the accuracy of his home BP cuff. Continue minoxidil 2.5 mg two tabs twice daily, labetalol 200 mg twice daily, and HCTZ 25 mg daily. I will reassess his potassium level today.

## 2023-05-09 NOTE — Assessment & Plan Note (Signed)
 I will reassess lipids today.

## 2023-05-09 NOTE — Assessment & Plan Note (Signed)
 Was being treated with spironolactone, but recently developed hyperkalemia. I will reassess his electrolytes today.

## 2023-05-09 NOTE — Assessment & Plan Note (Signed)
 I will add a PRN Lasix dose for days when he has increased swelling. We will monitor his potassium level.

## 2023-05-09 NOTE — Progress Notes (Signed)
 [facility] [address] [place] [address] Dept: [phone] Dept Fax: [phone]  Chronic Care Office Visit  Subjective:    Patient ID: Jeremy Fox, male    DOB: 1946-01-10, 78 y.o..   MRN: [id number]  Chief Complaint  Patient presents with   Hypertension    3 month f/u HTN.   Fasting today.      History of Present Illness:  Patient is in today for reassessment of chronic medical issues.  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 He is currently managed on minoxidil 2.5 mg 2 tabs twice daily. labetalol 200 mg twice daily, and HCTZ 25 mg daily. He had been on spironolactone 100 mg daily, but developed an issue about a month ago with hyperkalemia. Although he was advised to stop his spironolactone, he has been having worse issues with his lower leg edema. He admits that he did take a dose on Saturday. He feels this does give him a good diuresis. [name] is back to exercising three times a week. He had cut this back last month after a flare of ankle pain apparently due to an old avulsion fracture of his talus.  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   Ventral hernia without obstruction or gangrene 06/01/2022   Dermal nevus of parietal region of scalp 06/01/2022   Sensorineural hearing loss (SNHL) of both ears 12/11/2021   Swelling of both lower extremities 11/10/2021   Urinary retention 04/05/2021   Benign prostatic hyperplasia 10/14/2020   Class 1 obesity due to excess calories with serious comorbidity and body mass index (BMI) of 31.0 to 31.9 in adult 09/18/2020   Onychomycosis of right great toe 06/07/2019   Prominent  metatarsal head, right 06/07/2019   S/P arthroscopy of right shoulder 06/05/2019   Chronic pain of both knees 05/08/2019   Chronic right shoulder pain 05/08/2019   Elevated PSA 09/05/2018   Memory loss 09/05/2018   OSA (obstructive sleep apnea) 03/29/2016   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   Dyslipidemia 06/03/2015   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/03/2015   Rotator cuff syndrome of left shoulder 06/03/2015   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   Past Surgical History:  Procedure Laterality Date   CATARACT EXTRACTION W/ INTRAOCULAR LENS IMPLANT Bilateral    INCISIONAL HERNIA REPAIR N/A 07/16/2022   Procedure: LAPAROSCOPIC INCISIONAL HERNIA REPAIR WITH MESH;  Surgeon: Gaynelle Adu, MD;  Location: [facility];  Service: General;  Laterality: N/A;   REPLACEMENT TOTAL KNEE Bilateral    XI ROBOTIC ASSISTED SIMPLE PROSTATECTOMY N/A 06/26/2021   Procedure: XI ROBOTIC ASSISTED SIMPLE PROSTATECTOMY;  Surgeon: Sebastian Ache, MD;  Location: [facility];  Service: Urology;  Laterality: N/A;   Family History  Problem Relation Age of Onset   Heart attack Father 86   Heart disease Father    Diabetes Brother    Outpatient Medications Prior to Visit  Medication Sig Dispense Refill   hydrochlorothiazide (HYDRODIURIL) 25 MG tablet Take 1 tablet (25 mg total) by mouth daily. 90 tablet 3   labetalol (NORMODYNE) 200 MG tablet Take 1 tablet (200 mg total) by mouth 2 (two) times daily. 180 tablet 3   minoxidil (LONITEN) 2.5 MG tablet Take 2 tablets (5 mg total) by mouth 2 (  two) times daily. 360 tablet 3   finasteride (PROSCAR) 5 MG tablet Take 5 mg by mouth daily.     aspirin EC 81 MG tablet Take 81 mg by mouth daily. Swallow whole. (Patient not taking: Reported on 03/30/2023)     No facility-administered medications prior to visit.   Allergies  Allergen Reactions   Sulfa Antibiotics Rash   Sulfamethoxazole Rash   Objective:   Today's Vitals   05/09/23 0836  BP: 138/76  Pulse: 63   Temp: 98.7 F (37.1 C)  TempSrc: Temporal  SpO2: 100%  Weight: 250 lb 12.8 oz (113.8 kg)  Height: 6' (1.829 m)   Body mass index is 34.01 kg/m.   General: Well developed, well nourished. No acute distress. Psych: Alert and oriented. Normal mood and affect.  Health Maintenance Due  Topic Date Due   Medicare Annual Wellness (AWV)  06/02/2016     Assessment & Plan:   Problem List Items Addressed This Visit       Cardiovascular and Mediastinum   Resistant hypertension - Primary   BP is in adequate control in the office today. His home BPs look much higher, but there has been some question as to the accuracy of his home BP cuff. Continue minoxidil 2.5 mg two tabs twice daily, labetalol 200 mg twice daily, and HCTZ 25 mg daily. I will reassess his potassium level today.      Relevant Medications   furosemide (LASIX) 20 MG tablet     Endocrine   Primary hyperaldosteronism (HCC)   Was being treated with spironolactone, but recently developed hyperkalemia. I will reassess his electrolytes today.      Relevant Orders   Basic metabolic panel     Genitourinary   Benign prostatic hyperplasia   Stable. Continue finasteride 5 mg daily.      Relevant Medications   finasteride (PROSCAR) 5 MG tablet     Other   Dyslipidemia   I will reassess lipids today.      Relevant Orders   Lipid panel   Swelling of both lower extremities   I will add a PRN Lasix dose for days when he has increased swelling. We will monitor his potassium level.      Relevant Medications   furosemide (LASIX) 20 MG tablet   Other Visit Diagnoses       Hyperkalemia       Now off of spironolactone. I will reassess his potassium today.   Relevant Orders   Basic metabolic panel       Return in about 3 months (around 08/09/2023) for Reassessment.   Loyola Mast, MD

## 2023-05-10 ENCOUNTER — Encounter (HOSPITAL_BASED_OUTPATIENT_CLINIC_OR_DEPARTMENT_OTHER): Payer: Self-pay | Admitting: Pharmacist Clinician (PhC)/ Clinical Pharmacy Specialist

## 2023-05-10 ENCOUNTER — Ambulatory Visit (HOSPITAL_BASED_OUTPATIENT_CLINIC_OR_DEPARTMENT_OTHER): Payer: PPO | Admitting: Pharmacist Clinician (PhC)/ Clinical Pharmacy Specialist

## 2023-05-10 VITALS — BP 130/62 | HR 72 | Ht 72.0 in | Wt 250.1 lb

## 2023-05-10 DIAGNOSIS — I1A0 Resistant hypertension: Secondary | ICD-10-CM | POA: Diagnosis not present

## 2023-05-10 NOTE — Patient Instructions (Addendum)
 Follow up appointment: with Dr. [name] Salvia in May  Take your BP meds as follows:  Continue with your current medications  Check your blood pressure at home daily (if able) and keep record of the readings.  Your blood pressure goal is < 130/80  To check your pressure at home you will need to:  1. Sit up in a chair, with feet flat on the floor and back supported. Do not cross your ankles or legs. 2. Rest your left arm so that the cuff is about heart level. If the cuff goes on your upper arm,  then just relax the arm on the table, arm of the chair or your lap. If you have a wrist cuff, we  suggest relaxing your wrist against your chest (think of it as Pledging the Flag with the  wrong arm).  3. Place the cuff snugly around your arm, about 1 inch above the crook of your elbow. The  cords should be inside the groove of your elbow.  4. Sit quietly, with the cuff in place, for about 5 minutes. After that 5 minutes press the power  button to start a reading. 5. Do not talk or move while the reading is taking place.  6. Record your readings on a sheet of paper. Although most cuffs have a memory, it is often  easier to see a pattern developing when the numbers are all in front of you.  7. You can repeat the reading after 1-3 minutes if it is recommended  Make sure your bladder is empty and you have not had caffeine or tobacco within the last 30 min  Always bring your blood pressure log with you to your appointments. If you have not brought your monitor in to be double checked for accuracy, please bring it to your next appointment.  You can find a list of quality blood pressure cuffs at [email or web address]  Important lifestyle changes to control high blood pressure  Intervention  Effect on the BP  Lose extra pounds and watch your waistline Weight loss is one of the most effective lifestyle changes for controlling blood pressure. If you're overweight or obese, losing even a small amount of weight can  help reduce blood pressure. Blood pressure might go down by about 1 millimeter of mercury (mm Hg) with each kilogram (about 2.2 pounds) of weight lost.  Exercise regularly As a general goal, aim for at least 30 minutes of moderate physical activity every day. Regular physical activity can lower high blood pressure by about 5 to 8 mm Hg.  Eat a healthy diet Eating a diet rich in whole grains, fruits, vegetables, and low-fat dairy products and low in saturated fat and cholesterol. A healthy diet can lower high blood pressure by up to 11 mm Hg.  Reduce salt (sodium) in your diet Even a small reduction of sodium in the diet can improve heart health and reduce high blood pressure by about 5 to 6 mm Hg.  Limit alcohol One drink equals 12 ounces of beer, 5 ounces of wine, or 1.5 ounces of 80-proof liquor.  Limiting alcohol to less than one drink a day for women or two drinks a day for men can help lower blood pressure by about 4 mm Hg.   If you have any questions or concerns please use My Chart to send questions or call the office at [phone]

## 2023-05-10 NOTE — Progress Notes (Signed)
 Office Visit    Patient Name: Jeremy Fox Date of Encounter: 05/10/2023  Primary Care Provider:  Loyola Mast, MD Primary Cardiologist:  None  Chief Complaint    Hypertension - Advanced hypertension clinic  Past Medical History   HLD 1/23 LDL 87, no medications, no ASCVD  OSA Compliant with CPAP    Allergies  Allergen Reactions   Sulfa Antibiotics Rash   Sulfamethoxazole Rash    History of Present Illness    Jeremy Fox is a 78 y.o. male patient who was referred to the Advanced Hypert[facility] by Dr. Herbie Drape.  We followed Mr. Bisig in our [employer] RPM study last year.  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   At that time the lisinopril was discontinued and spironolactone increased to 100 mg daily.   Potassium jumped to 6.2.  Spironolactone was held and he was started on minoxidil, now at 5 mg bid.  He saw his PCP yesterday, and was fond to have a BP of 138/76.  He admitted to taking a dose of spironolactone over the weekend because of lower leg edema, and that he felt better afterward.    Today he returns for follow up.  Home readings continue to be elevated, but was noted that his home device read 20 points higher when tested in the office.  No concerns since starting the minoxidil.  Blood Pressure Goal:  130/80  Current Medications: labetalol 200 mg bid, minoxidil 5 mg bid, hctz 25 mg every day   Adherence Assessment  Do you ever forget to take your medication? [] Yes [x] No  Do you ever skip doses due to side effects? [] Yes [x] No  Do you have trouble affording your medicines? [] Yes [x] No  Are you ever unable to pick up your medication due to transportation difficulties? [] Yes [x] No   Adherence strategy: 7 day minder  Previously tried:   carvedilol - switched to labetalol for better control;  hctz - hypokalemia; amlodipine - edema  Family Hx:  father died from MI 46 11  (never went to [place]), mother no heart history; brother died with DM complications, other brother (older) living with hypertension; 2 kids no known heart disease  Social Hx:      Tobacco: never  Alcohol: occasional wine  Caffeine:  no coffee/tea, occasional Cheerwine, Coke  Diet:  mostly home cooked meals, lots of venison, soups, spaghetti, garden vegetables, freezes many of them - has green beans, peas, okra, tomatoes   Exercise: Gym in [place] three days per week as able, usually 10 min on bike and 20 rowing.  Yesterday did 50 minutes on bike     Home BP readings:  last 3 weeks: average 151/72, home cuff known to be 20 points higher systolic.       Accessory Clinical Findings    Lab Results  Component Value Date   CREATININE 1.51 (H) 05/09/2023   BUN 29 (H) 05/09/2023   NA 132 (L) 05/09/2023   K 5.0 05/09/2023   CL 99 05/09/2023   CO2 24 05/09/2023   Lab Results  Component Value Date   ALT 31 04/05/2021   AST 33 04/05/2021   ALKPHOS 85 04/05/2021   BILITOT 1.0 04/05/2021   No results found for: "HGBA1C"  Screening for Secondary Hypertension:      11/16/2022    9:40 AM  Causes  Drugs/Herbals Screened     - Comments rare ibuprofen.  Higher  use in past.  Occasional caffeine. Rare EtOH.  Renovascular HTN Screened     - Comments check renal artery Dopplers  Sleep Apnea Screened     - Comments uses CPAP regularly  Thyroid Disease Screened  Hyperaldosteronism Screened     - Comments Check renin/aldosterone  Pheochromocytoma N/A  Cushing's Syndrome N/A  Hyperparathyroidism Screened  Coarctation of the Aorta Screened     - Comments BP symmetric  Compliance Screened    Relevant Labs/Studies:    Latest Ref Rng & Units 05/09/2023    9:12 AM 04/04/2023    9:12 AM 03/30/2023    1:11 PM  Basic Labs  Sodium 135 - 145 mEq/L 132  133  132   Potassium 3.5 - 5.1 mEq/L 5.0  5.0  6.2   Creatinine 0.40 - 1.50 mg/dL 4.09  8.11  9.14        Latest Ref Rng & Units 09/05/2018     2:30 PM  Thyroid   TSH 0.40 - 4.50 mIU/L 1.20        Latest Ref Rng & Units 12/09/2022    9:11 AM  Renin/Aldosterone   Aldosterone 0.0 - 30.0 ng/dL 78.2   Aldos/Renin Ratio 0.0 - 30.0 31.6                Home Medications    Current Outpatient Medications  Medication Sig Dispense Refill   finasteride (PROSCAR) 5 MG tablet Take 1 tablet (5 mg total) by mouth daily. 90 tablet 3   furosemide (LASIX) 20 MG tablet Take 1 tablet (20 mg total) by mouth daily as needed (lower leg swelling). 30 tablet 3   hydrochlorothiazide (HYDRODIURIL) 25 MG tablet Take 1 tablet (25 mg total) by mouth daily. 90 tablet 3   labetalol (NORMODYNE) 200 MG tablet Take 1 tablet (200 mg total) by mouth 2 (two) times daily. 180 tablet 3   minoxidil (LONITEN) 2.5 MG tablet Take 2 tablets (5 mg total) by mouth 2 (two) times daily. 360 tablet 3   No current facility-administered medications for this visit.     Assessment & Plan    Resistant hypertension Assessment: BP is un/controlled in office BP 130/62 mmHg;  above the goal (<130/80). Dx Conn's syndrome (excessive aldosterone production) Tolerates labetalol, minoxidil, hctz well without any side effects Denies SOB, palpitation, chest pain, headaches,or swelling Reiterated the importance of regular exercise and low salt diet   Plan:  Continue taking labetalol 200 mg bid, lisinopril 20 mg bid, minoxidil 5 mg bid, hctz 25 mg every day  Patient to keep record of BP readings with heart rate and report to [place] at the next visit Patient to follow up with Dr. [name] Salvia in May  Labs ordered today:  none - drawn yesterday at PCP   Phillips Hay PharmD CPP [facility]  [address] [place], [place] [address] [phone]

## 2023-05-10 NOTE — Assessment & Plan Note (Signed)
 Assessment: BP is un/controlled in office BP 130/62 mmHg;  above the goal (<130/80). Dx Conn's syndrome (excessive aldosterone production) Tolerates labetalol, minoxidil, hctz well without any side effects Denies SOB, palpitation, chest pain, headaches,or swelling Reiterated the importance of regular exercise and low salt diet   Plan:  Continue taking labetalol 200 mg bid, lisinopril 20 mg bid, minoxidil 5 mg bid, hctz 25 mg every day  Patient to keep record of BP readings with heart rate and report to [place] at the next visit Patient to follow up with Dr. [name] Salvia in May  Labs ordered today:  none - drawn yesterday at PCP

## 2023-07-28 ENCOUNTER — Encounter (HOSPITAL_BASED_OUTPATIENT_CLINIC_OR_DEPARTMENT_OTHER): Payer: Self-pay | Admitting: Cardiovascular Disease

## 2023-07-28 ENCOUNTER — Ambulatory Visit (HOSPITAL_BASED_OUTPATIENT_CLINIC_OR_DEPARTMENT_OTHER): Payer: PPO | Admitting: Cardiovascular Disease

## 2023-07-28 VITALS — BP 146/64 | HR 60 | Ht 72.0 in | Wt 257.2 lb

## 2023-07-28 DIAGNOSIS — G4733 Obstructive sleep apnea (adult) (pediatric): Secondary | ICD-10-CM

## 2023-07-28 DIAGNOSIS — E2609 Other primary hyperaldosteronism: Secondary | ICD-10-CM | POA: Diagnosis not present

## 2023-07-28 DIAGNOSIS — I440 Atrioventricular block, first degree: Secondary | ICD-10-CM

## 2023-07-28 DIAGNOSIS — E785 Hyperlipidemia, unspecified: Secondary | ICD-10-CM

## 2023-07-28 DIAGNOSIS — I1A0 Resistant hypertension: Secondary | ICD-10-CM | POA: Diagnosis not present

## 2023-07-28 DIAGNOSIS — R5383 Other fatigue: Secondary | ICD-10-CM

## 2023-07-28 MED ORDER — HYDRALAZINE HCL 25 MG PO TABS: 25.0000 mg | ORAL_TABLET | Freq: Two times a day (BID) | ORAL | 3 refills | Status: AC

## 2023-07-28 NOTE — Progress Notes (Signed)
 [facility] Follow Up:    Date:  07/28/2023   ID:  Jeremy Fox, DOB 13-Jul-1945, MRN [id number]  PCP:  Graig Lawyer, MD  Cardiologist:  None  Nephrologist:  Referring MD: Graig Lawyer, MD   CC: Hypertension  History of Present Illness:    Jeremy Fox is a 78 y.o. 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  He first established care in the [facility] 11/2022. He last saw Dr. Gordan Latina 11/2021 and blood pressure was 134/86. He had some LE edema and amlodipine  was thought to be contributing. He was admitted for hernia repair 07/2022. He was switched from lisinopril /HCTZ to just lisinopril  daily due to low potassium. He remained on minoxidil  and spironolactone . His PCP switched him from carvedilol  to labetalol  and blood pressures remained elevated, so he was referred to the [facility].  At his initial visit his blood pressure was in the 180s over 70s.  This was in the setting of being out of spironolactone  for 1-2 weeks.   Labs were concerning for hyperaldosteronism.  He enrolled in our remote patient monitoring study.  He followed up with our pharmacist and minoxidil  was increased.  Blood pressure remained uncontrolled and lisinopril  was decreased so that spironolactone  could be increased 01/2023.  He became hyperkalemic and spironolactone  was discontinued.  He had a CT of the abdomen that was consistent with bilateral adrenal hyperplasia without any evidence of adrenal adenomas.  Therefore spironolactone  was resumed and HCTZ was added.  Spironolactone  was increased to 100mg  with improved BP control.  However, he developed hyperkalemia.  He was started on minoxidil .  He saw Donivan Furry, PharmD 05/2023 and remained on labetalol , lisinopril , minoxidil , and HCTZ.  Discussed the use of AI scribe software for clinical note transcription with the patient, who  gave verbal consent to proceed.  History of Present Illness Jeremy Fox presents for follow up on blood pressure management. He is accompanied by his wife.\  He experiences persistent fatigue and low energy levels, which he attributes to his blood pressure medication and age. This fatigue has been ongoing since his last four surgeries. He can work for about half a day before needing to rest and often then sleeps for half a day. He sleeps well at night but sometimes does not use his CPAP machine for long enough due to late bedtimes.  He maintains physical activity by going to the gym, using a bicycle, and lifting weights, although it significantly tires his legs. No chest pain or pressure, and his breathing has been good.  His blood pressure readings at home are typically in the 140s to 150s, but he notes that his home machine reads about 20 points higher than the office readings. His blood pressure tends to be lower after exercising.  He discusses his dietary habits, noting a high intake of chips and salsa, which may contribute to high sodium intake. He consumes soft drinks mixed with magnesium three times a week. He tries to limit salt intake otherwise, using very little salt in cooking and opting for no-salt crackers.  His cholesterol levels are well-controlled, with a total cholesterol of 146 and LDL of 86. His diet includes oatmeal, grits, strawberries, peaches, and honey for breakfast, and he avoids greasy foods by using deer meat instead of hamburger. He occasionally consumes sausage, which he notes affects his blood pressure.  He dislikes taking multiple medications but acknowledges the  necessity for managing his health conditions. He is currently taking several medications for blood pressure.  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    Past Surgical History:  Procedure Laterality Date   CATARACT EXTRACTION W/ INTRAOCULAR LENS IMPLANT Bilateral    INCISIONAL HERNIA REPAIR N/A 07/16/2022   Procedure: LAPAROSCOPIC INCISIONAL HERNIA REPAIR WITH MESH;  Surgeon: Aldean Hummingbird, MD;  Location: [facility];  Service: General;  Laterality: N/A;   REPLACEMENT TOTAL KNEE Bilateral    XI ROBOTIC ASSISTED SIMPLE PROSTATECTOMY N/A 06/26/2021   Procedure: XI ROBOTIC ASSISTED SIMPLE PROSTATECTOMY;  Surgeon: Osborn Blaze, MD;  Location: [facility];  Service: Urology;  Laterality: N/A;    Current Medications: Current Meds  Medication Sig   finasteride  (PROSCAR ) 5 MG tablet Take 1 tablet (5 mg total) by mouth daily.   furosemide  (LASIX ) 20 MG tablet Take 1 tablet (20 mg total) by mouth daily as needed (lower leg swelling).   hydrALAZINE  (APRESOLINE ) 25 MG tablet Take 1 tablet (25 mg total) by mouth every 12 (twelve) hours.   labetalol  (NORMODYNE ) 200 MG tablet Take 1 tablet (200 mg total) by mouth 2 (two) times daily.   minoxidil  (LONITEN ) 2.5 MG tablet Take 2 tablets (5 mg total) by mouth 2 (two) times daily.     Allergies:   Sulfa antibiotics and Sulfamethoxazole   Social History   Socioeconomic History   Marital status: Significant Other    Spouse name: Not on file   Number of children: 2   Years of education: Not on file   Highest education level: Associate degree: occupational, Scientist, product/process development, or vocational program  Occupational History   Occupation: Retired    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[employer]  Tobacco Use   Smoking status: Never     Passive exposure: Never   Smokeless tobacco: Never  Vaping Use   Vaping status: Never Used  Substance and Sexual Activity   Alcohol use: Never   Drug use: No   Sexual activity: Yes  Other Topics Concern   Not on file  Social History Narrative   Not on file   Social Drivers of Health   Financial Resource Strain: Low Risk  (05/06/2023)   Overall Financial Resource Strain (CARDIA)    Difficulty of Paying Living Expenses: Not hard at all  Food Insecurity: No Food Insecurity (05/06/2023)   Hunger Vital Sign    Worried About Running Out of Food in the Last Year: Never true    Ran Out of Food in the Last Year: Never true  Transportation Needs: No Transportation Needs (05/06/2023)   PRAPARE - Transportation    Lack of Transportation (Medical): No    Lack of Transportation (Non-Medical): No  Physical Activity: Inactive (05/06/2023)   Exercise Vital Sign    Days of Exercise per Week: 4 days    Minutes of Exercise per Session: 0 min  Stress: Not on file  Social Connections: Unknown (05/06/2023)   Social Connection and Isolation Panel [  NHANES]    Frequency of Communication with Friends and Family: Patient declined    Frequency of Social Gatherings with Friends and Family: Patient declined    Attends Religious Services: Patient declined    Database administrator or Organizations: Patient declined    Attends Engineer, structural: Not on file    Marital Status: Patient declined     Family History: The patient's family history includes Diabetes in his brother; Heart attack (age of onset: 67) in his father; Heart disease in his father.  ROS:   Please see the history of present illness. All other systems reviewed and are negative.  EKGs/Labs/Other Studies Reviewed:    Lexiscan  Stress Test  10/22/2020: Nuclear stress EF: 66%. The left ventricular ejection fraction is hyperdynamic (>65%). There was no ST segment deviation noted during stress. No T wave inversion was noted during  stress. The study is normal. This is a low risk study.  EKG:  EKG is personally reviewed. 11/16/2022: Not ordered.  Recent Labs: 05/09/2023: BUN 29; Creatinine, Ser 1.51; Potassium 5.0; Sodium 132   Recent Lipid Panel    Component Value Date/Time   CHOL 146 05/09/2023 0912   CHOL 173 05/01/2018 0803   TRIG 89.0 05/09/2023 0912   HDL 41.80 05/09/2023 0912   HDL 49 05/01/2018 0803   CHOLHDL 3 05/09/2023 0912   VLDL 17.8 05/09/2023 0912   LDLCALC 86 05/09/2023 0912   LDLCALC 109 (H) 05/01/2018 0803    Physical Exam:    VS:  BP (!) 146/64   Pulse 60   Ht 6' (1.829 m)   Wt 257 lb 3.2 oz (116.7 kg)   SpO2 97%   BMI 34.88 kg/m  , BMI Body mass index is 34.88 kg/m. GENERAL:  Well appearing HEENT: Pupils equal round and reactive, fundi not visualized, oral mucosa unremarkable NECK:  No jugular venous distention, waveform within normal limits, carotid upstroke brisk and symmetric, LUNGS:  Clear to auscultation bilaterally HEART:  RRR.  PMI not displaced or sustained, S1 and S2 within normal limits, no S3, no S4, no clicks, no rubs, no murmurs ABD:  Flat, positive bowel sounds normal in frequency in pitch, no bruits, no rebound, no guarding, no midline pulsatile mass, no hepatomegaly, no splenomegaly EXT:  2 plus pulses throughout, 1+ LE pitting edema, no cyanosis, no clubbing SKIN:  No rashes, no nodules NEURO:  Cranial nerves II through XII grossly intact, motor grossly intact throughout PSYCH:  Cognitively intact, oriented to person place and time   ASSESSMENT/PLAN:    Assessment & Plan # Hypertension: # Hyperaldosteronism: Hypertension difficult to control due to hyperaldosteronism. Aldosterone 19, renin 0.601.  Bilateral adrenal hyperplasia on imaging.  Spironolactone  effective but caused hyperkalemia. Home BP readings higher than office, likely due to machine calibration. Dietary sodium intake may contribute to elevated BP. Referral to endocrinologist considered for  alternative treatments. - Refer to an endocrinologist for further evaluation and management of hyperaldosteronism. - Add hydralazine  25 mg twice daily to current antihypertensive regimen. - Advise reducing dietary sodium intake, specifically from chips and salsa. - Provide blood pressure log sheets for home monitoring. -Continue hydrochlorothiazide , labetalol  and minoxidil   # Edema Mild ankle and leg edema likely related to hypertension, high sodium intake and minoxidil .  Current diuretic therapy not fully effective. - Continue hydrochlorothiazide  and lasix  as needed. - Advise reducing dietary sodium intake to help manage edema.   Screening for Secondary Hypertension:     11/16/2022    9:40 AM  Causes  Drugs/Herbals Screened     - Comments rare ibuprofen .  Higher use in past.  Occasional caffeine. Rare EtOH.  Renovascular HTN Screened     - Comments check renal artery Dopplers  Sleep Apnea Screened     - Comments uses CPAP regularly  Thyroid  Disease Screened  Hyperaldosteronism Screened     - Comments Check renin/aldosterone  Pheochromocytoma N/A  Cushing's Syndrome N/A  Hyperparathyroidism Screened  Coarctation of the Aorta Screened     - Comments BP symmetric  Compliance Screened    Relevant Labs/Studies:    Latest Ref Rng & Units 05/09/2023    9:12 AM 04/04/2023    9:12 AM 03/30/2023    1:11 PM  Basic Labs  Sodium 135 - 145 mEq/L 132  133  132   Potassium 3.5 - 5.1 mEq/L 5.0  5.0  6.2   Creatinine 0.40 - 1.50 mg/dL 3.01  6.01  0.93        Latest Ref Rng & Units 09/05/2018    2:30 PM  Thyroid    TSH 0.40 - 4.50 mIU/L 1.20     Disposition:    FU with Myiah Petkus C. Theodis Fiscal, MD, [facility] in 3-4 months.  Medication Adjustments/Labs and Tests Ordered: Current medicines are reviewed at length with the patient today.  Concerns regarding medicines are outlined above.   Orders Placed This Encounter  Procedures   TSH   Ambulatory referral to Endocrinology   EKG 12-Lead    Meds ordered this encounter  Medications   hydrALAZINE  (APRESOLINE ) 25 MG tablet    Sig: Take 1 tablet (25 mg total) by mouth every 12 (twelve) hours.    Dispense:  270 tablet    Refill:  3    Signed, Maudine Sos, MD  07/28/2023 11:19 AM    [facility]

## 2023-07-28 NOTE — Patient Instructions (Addendum)
 Medication Instructions:  START HYDRALAZINE  25 MG TWICE A DAY (12 HOURS APART)   Labwork: TSH TODAY   Testing/Procedures: NONE  Follow-Up: 3 MONTHS WITH DR Surfside OR CAITLIN W NP   Any Other Special Instructions Will Be Listed Below (If Applicable).  MONITOR AND LOG YOUR BLOOD PRESSURES. BRING YOUR LOGS AND MACHINE TO FOLLOW UP   If you need a refill on your cardiac medications before your next appointment, please call your pharmacy.

## 2023-07-29 ENCOUNTER — Ambulatory Visit: Payer: Self-pay | Admitting: Cardiovascular Disease

## 2023-07-29 LAB — TSH: TSH: 2.04 u[IU]/mL (ref 0.450–4.500)

## 2023-08-09 ENCOUNTER — Ambulatory Visit (INDEPENDENT_AMBULATORY_CARE_PROVIDER_SITE_OTHER): Admitting: Family Medicine

## 2023-08-09 ENCOUNTER — Encounter: Payer: Self-pay | Admitting: Family Medicine

## 2023-08-09 ENCOUNTER — Ambulatory Visit: Payer: Self-pay | Admitting: Family Medicine

## 2023-08-09 VITALS — BP 134/72 | HR 71 | Temp 98.0°F | Ht 72.0 in | Wt 258.0 lb

## 2023-08-09 DIAGNOSIS — E2609 Other primary hyperaldosteronism: Secondary | ICD-10-CM | POA: Diagnosis not present

## 2023-08-09 DIAGNOSIS — I1A0 Resistant hypertension: Secondary | ICD-10-CM

## 2023-08-09 LAB — BASIC METABOLIC PANEL WITH GFR
BUN: 25 mg/dL — ABNORMAL HIGH (ref 6–23)
CO2: 29 meq/L (ref 19–32)
Calcium: 8.3 mg/dL — ABNORMAL LOW (ref 8.4–10.5)
Chloride: 97 meq/L (ref 96–112)
Creatinine, Ser: 1.76 mg/dL — ABNORMAL HIGH (ref 0.40–1.50)
GFR: 36.12 mL/min — ABNORMAL LOW (ref >60.00)
Glucose, Bld: 106 mg/dL — ABNORMAL HIGH (ref 70–99)
Potassium: 3.6 meq/L (ref 3.5–5.1)
Sodium: 134 meq/L — ABNORMAL LOW (ref 135–145)

## 2023-08-09 NOTE — Progress Notes (Signed)
 [facility] [address] [place] [address] Dept: [phone] Dept Fax: [phone]  Chronic Care Office Visit  Subjective:    Patient ID: Jeremy Fox, male    DOB: 25-Feb-1946, 78 y.o..   MRN: [id number]  Chief Complaint  Patient presents with   Hypertension    3 month f/u HTN.      History of Present Illness:  Patient is in today for reassessment of chronic medical issues.  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 He is currently managed on minoxidil  2.5 mg 2 tabs twice daily, labetalol  200 mg twice daily, and HCTZ 25 mg daily, and hydralazine  25 mg twice daily. He had been on spironolactone , but developed hyperkalemia. He has had issues with lower leg edema, so has furosemide  available to help manage this. Jeremy Fox tries to remain active and does go tot he gym several days a week. However, he has been noticing more generalized fatigue, which makes him want to nap more often during the day. He does not have nonrestorative sleep. he does have OSA and uses his CPAP regularly.  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   Ventral hernia without obstruction or gangrene 06/01/2022   Dermal nevus of parietal region of scalp 06/01/2022   Sensorineural hearing loss (SNHL) of both ears 12/11/2021   Swelling of both lower extremities 11/10/2021   Urinary retention 04/05/2021   Benign prostatic hyperplasia 10/14/2020   Class 1 obesity due to excess calories with serious comorbidity and body mass index (BMI) of 31.0 to 31.9 in adult 09/18/2020   Onychomycosis of right great toe 06/07/2019   Prominent metatarsal head, right 06/07/2019    S/P arthroscopy of right shoulder 06/05/2019   Chronic pain of both knees 05/08/2019   Chronic right shoulder pain 05/08/2019   Elevated PSA 09/05/2018   Memory loss 09/05/2018   OSA (obstructive sleep apnea) 03/29/2016   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   Dyslipidemia 06/03/2015   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/03/2015   Rotator cuff syndrome of left shoulder 06/03/2015   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   Past Surgical History:  Procedure Laterality Date   CATARACT EXTRACTION W/ INTRAOCULAR LENS IMPLANT Bilateral    INCISIONAL HERNIA REPAIR N/A 07/16/2022   Procedure: LAPAROSCOPIC INCISIONAL HERNIA REPAIR WITH MESH;  Surgeon: Aldean Hummingbird, MD;  Location: [facility];  Service: General;  Laterality: N/A;   REPLACEMENT TOTAL KNEE Bilateral    XI ROBOTIC ASSISTED SIMPLE PROSTATECTOMY N/A 06/26/2021   Procedure: XI ROBOTIC ASSISTED SIMPLE PROSTATECTOMY;  Surgeon: Osborn Blaze, MD;  Location: [facility];  Service: Urology;  Laterality: N/A;   Family History  Problem Relation Age of Onset   Heart attack Father 6   Heart disease Father    Diabetes Brother    Outpatient Medications Prior to Visit  Medication Sig Dispense Refill   finasteride  (PROSCAR ) 5 MG tablet Take 1 tablet (5 mg total) by mouth daily. 90 tablet 3   furosemide  (LASIX ) 20 MG tablet Take 1 tablet (20 mg total) by mouth daily as needed (lower leg swelling). 30 tablet 3   hydrALAZINE  (APRESOLINE ) 25 MG tablet Take 1 tablet (25 mg total) by mouth every 12 (twelve) hours. 270 tablet 3   labetalol  (NORMODYNE ) 200  MG tablet Take 1 tablet (200 mg total) by mouth 2 (two) times daily. 180 tablet 3   minoxidil  (LONITEN ) 2.5 MG tablet Take 2 tablets (5 mg total) by mouth 2 (two) times daily. 360 tablet 3   hydrochlorothiazide  (HYDRODIURIL ) 25 MG tablet Take 1 tablet (25 mg total) by mouth daily. 90 tablet 3   No facility-administered medications prior to visit.   Allergies  Allergen Reactions   Sulfa  Antibiotics Rash   Sulfamethoxazole Rash   Objective:   Today's Vitals   08/09/23 0828  BP: 134/72  Pulse: 71  Temp: 98 F (36.7 C)  TempSrc: Temporal  SpO2: 97%  Weight: 258 lb (117 kg)  Height: 6' (1.829 m)   Body mass index is 34.99 kg/m.   General: Well developed, well nourished. No acute distress. Psych: Alert and oriented. Normal mood and affect.  Health Maintenance Due  Topic Date Due   Medicare Annual Wellness (AWV)  06/02/2016     Assessment & Plan:   Problem List Items Addressed This Visit       Cardiovascular and Mediastinum   Resistant hypertension - Primary   BP is in adequate control in the office today. His home BPs area bit higher (7-day average = 145/67), but there has been some question as to the accuracy of his home BP cuff. Continue minoxidil  2.5 mg two tabs twice daily, labetalol  200 mg twice daily, HCTZ 25 mg daily, and hydralazine  25 mg twice daily.        Endocrine   Primary hyperaldosteronism (HCC)   Was being treated with spironolactone , but developed hyperkalemia. He has exhibited some mild chronic hyponatremia. We discussed that his fatigue may be related to this. Dr. Theodis Fiscal (cardiology) has referred him to Dr. Aldona Amel (endocrinology). Scheduling is still pending. I will reassess his electrolytes today.      Relevant Orders   Basic metabolic panel with GFR    Return in about 3 months (around 11/09/2023) for Reassessment.   Graig Lawyer, MD

## 2023-08-09 NOTE — Assessment & Plan Note (Signed)
 BP is in adequate control in the office today. His home BPs area bit higher (7-day average = 145/67), but there has been some question as to the accuracy of his home BP cuff. Continue minoxidil  2.5 mg two tabs twice daily, labetalol  200 mg twice daily, HCTZ 25 mg daily, and hydralazine  25 mg twice daily.

## 2023-08-09 NOTE — Assessment & Plan Note (Signed)
 Was being treated with spironolactone , but developed hyperkalemia. He has exhibited some mild chronic hyponatremia. We discussed that his fatigue may be related to this. Dr. Theodis Fiscal (cardiology) has referred him to Dr. Aldona Amel (endocrinology). Scheduling is still pending. I will reassess his electrolytes today.

## 2023-09-01 ENCOUNTER — Other Ambulatory Visit: Payer: Self-pay | Admitting: Family Medicine

## 2023-09-01 DIAGNOSIS — M7989 Other specified soft tissue disorders: Secondary | ICD-10-CM

## 2023-09-20 ENCOUNTER — Other Ambulatory Visit: Payer: Self-pay

## 2023-09-20 DIAGNOSIS — M7989 Other specified soft tissue disorders: Secondary | ICD-10-CM

## 2023-09-20 MED ORDER — FUROSEMIDE 20 MG PO TABS: 20.0000 mg | ORAL_TABLET | Freq: Every day | ORAL | 3 refills | Status: DC

## 2023-09-20 NOTE — Telephone Encounter (Signed)
 Copied from CRM [id number]. Topic: Clinical - Medication Question >> Sep 20, 2023  9:26 AM Deaijah H wrote: Reason for CRM: Patient called in wanting to know if Dr. Thedora would like to know if he can increase furosemide  (LASIX ) 20 MG tablet to taking twice a day instead of once a day stated helped with keeping the swelling down. Please call [phone] to confirm.

## 2023-09-20 NOTE — Telephone Encounter (Signed)
 Patient scheduled for 09/23/23 @ 8:20 am.   He will need the Furosemide  sent into the pharmacy due to being out for 1 week.  He has had to take it twice daily at times and the insurance will not pay for it to be filled till 10/02/23. Dm/cma

## 2023-09-21 ENCOUNTER — Ambulatory Visit (INDEPENDENT_AMBULATORY_CARE_PROVIDER_SITE_OTHER): Admitting: Family Medicine

## 2023-09-21 ENCOUNTER — Encounter: Payer: Self-pay | Admitting: Family Medicine

## 2023-09-21 VITALS — BP 124/72 | HR 78 | Temp 97.2°F | Ht 72.0 in | Wt 258.2 lb

## 2023-09-21 DIAGNOSIS — M7989 Other specified soft tissue disorders: Secondary | ICD-10-CM | POA: Diagnosis not present

## 2023-09-21 DIAGNOSIS — I119 Hypertensive heart 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: Secondary | ICD-10-CM

## 2023-09-21 DIAGNOSIS — I1A0 Resistant hypertension: Secondary | ICD-10-CM

## 2023-09-21 NOTE — Assessment & Plan Note (Signed)
 Stable and no sign of active heart failure.

## 2023-09-21 NOTE — Progress Notes (Signed)
 [facility] [address] [place] [address] Dept: [phone] Dept Fax: [phone]  Office Visit  Subjective:    Patient ID: Garen JONETTA Daughters, male    DOB: 09-Oct-1945, 78 y.o..   MRN: [id number]  Chief Complaint  Patient presents with   Leg Swelling    C/o having bilateral leg swelling.  Has been out of Furosemide  x 1 week.    History of Present Illness:  Patient is in today with a complaint of increased swelling in his lower legs. Mr. 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 He had been managed on furosemide  20 mg daily for the edema. His blood pressure is managed on minoxidil  2.5 mg 2 tabs twice daily, labetalol  200 mg twice daily, HCTZ 25 mg daily, and hydralazine  25 mg twice daily. Since the summer temperatures and humidity have gone up, Mr. Genet has noted a significant increase in his lower leg edema. This can fluctuate and there are days when it is not so bad. He had previously been provided compression stockings, but notes he no longer has these. He has increased his furosemide  to twice a day, which led to him running out of his medicine early. His insurance has been unwilling to renew his prescription.  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   Ventral hernia without obstruction or gangrene 06/01/2022   Dermal nevus of parietal region of scalp 06/01/2022   Sensorineural hearing loss (SNHL) of both ears 12/11/2021   Swelling of both lower extremities 11/10/2021   Urinary retention 04/05/2021   Benign prostatic hyperplasia 10/14/2020   Class  1 obesity due to excess calories with serious comorbidity and body mass index (BMI) of 31.0 to 31.9 in adult 09/18/2020   Onychomycosis of right great toe 06/07/2019   Prominent metatarsal head, right 06/07/2019   S/P arthroscopy of right shoulder 06/05/2019   Chronic pain of both knees 05/08/2019   Chronic right shoulder pain 05/08/2019   Elevated PSA 09/05/2018   Memory loss 09/05/2018   OSA (obstructive sleep apnea) 03/29/2016   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   Dyslipidemia 06/03/2015   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/03/2015   Rotator cuff syndrome of left shoulder 06/03/2015   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   Past Surgical History:  Procedure Laterality Date   CATARACT EXTRACTION W/ INTRAOCULAR LENS IMPLANT Bilateral    INCISIONAL HERNIA REPAIR N/A 07/16/2022   Procedure: LAPAROSCOPIC INCISIONAL HERNIA REPAIR WITH MESH;  Surgeon: Tanda Locus, MD;  Location: [facility];  Service: General;  Laterality: N/A;   REPLACEMENT TOTAL KNEE Bilateral    XI ROBOTIC ASSISTED SIMPLE PROSTATECTOMY N/A 06/26/2021   Procedure: XI ROBOTIC ASSISTED SIMPLE PROSTATECTOMY;  Surgeon: Alvaro Hummer, MD;  Location: [facility];  Service: Urology;  Laterality: N/A;   Family History  Problem Relation Age of Onset   Heart attack Father 12   Heart disease Father    Diabetes Brother    Outpatient Medications Prior to Visit  Medication Sig Dispense Refill   finasteride  (PROSCAR ) 5 MG tablet Take 1 tablet (5 mg total) by mouth daily. 90 tablet 3   furosemide  (LASIX ) 20 MG tablet Take 1 tablet (20  mg total) by mouth daily. May take an additional 20 mg dose daily as needed for increased lower leg edema. 60 tablet 3   hydrALAZINE  (APRESOLINE ) 25 MG tablet Take 1 tablet (25 mg total) by mouth every 12 (twelve) hours. 270 tablet 3   hydrochlorothiazide  (HYDRODIURIL ) 25 MG tablet Take 1 tablet (25 mg total) by mouth daily. 90 tablet 3   labetalol  (NORMODYNE ) 200 MG tablet Take 1 tablet (200 mg  total) by mouth 2 (two) times daily. 180 tablet 3   minoxidil  (LONITEN ) 2.5 MG tablet Take 2 tablets (5 mg total) by mouth 2 (two) times daily. 360 tablet 3   No facility-administered medications prior to visit.   Allergies  Allergen Reactions   Sulfa Antibiotics Rash   Sulfamethoxazole Rash     Objective:   Today's Vitals   09/21/23 1530  BP: 124/72  Pulse: 78  Temp: (!) 97.2 F (36.2 C)  TempSrc: Temporal  SpO2: 96%  Weight: 258 lb 3.2 oz (117.1 kg)  Height: 6' (1.829 m)   Body mass index is 35.02 kg/m.   General: Well developed, well nourished. No acute distress. Extremities: 3+ edema of the lower legs and feet. Psych: Alert and oriented. Normal mood and affect.  Health Maintenance Due  Topic Date Due   Medicare Annual Wellness (AWV)  06/02/2016   Assessment & Plan:   Problem List Items Addressed This Visit       Cardiovascular and Mediastinum   Hypertensive heart 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   Stable and no sign of active heart failure.      Resistant hypertension   BP is at goal today. Continue minoxidil  2.5 mg two tabs twice daily, labetalol  200 mg twice daily, HCTZ 25 mg daily, and hydralazine  25 mg twice daily.        Other   Swelling of both lower extremities - Primary   I encourage Mr. Radermacher to take rest breaks during the day and elevate his legs. He should consider getting another pair of compression stockings to help limit swelling. I did revise his furosemide  prescription yesterday to allow for the increased dose. I will check a BMP today to make sure his potassium level is staying in a normal range.      Relevant Orders   Basic metabolic panel with GFR    Return for Follow-up as scheduled.   Garnette CHRISTELLA Simpler, MD

## 2023-09-21 NOTE — Assessment & Plan Note (Signed)
 BP is at goal today. Continue minoxidil  2.5 mg two tabs twice daily, labetalol  200 mg twice daily, HCTZ 25 mg daily, and hydralazine  25 mg twice daily.

## 2023-09-21 NOTE — Assessment & Plan Note (Signed)
 I encourage Jeremy Fox to take rest breaks during the day and elevate his legs. He should consider getting another pair of compression stockings to help limit swelling. I did revise his furosemide  prescription yesterday to allow for the increased dose. I will check a BMP today to make sure his potassium level is staying in a normal range.

## 2023-09-22 ENCOUNTER — Ambulatory Visit: Payer: Self-pay | Admitting: Family Medicine

## 2023-09-22 LAB — BASIC METABOLIC PANEL WITH GFR
BUN: 30 mg/dL — ABNORMAL HIGH (ref 6–23)
CO2: 27 meq/L (ref 19–32)
Calcium: 8.7 mg/dL (ref 8.4–10.5)
Chloride: 104 meq/L (ref 96–112)
Creatinine, Ser: 1.66 mg/dL — ABNORMAL HIGH (ref 0.40–1.50)
GFR: 38.72 mL/min — ABNORMAL LOW (ref >60.00)
Glucose, Bld: 109 mg/dL — ABNORMAL HIGH (ref 70–99)
Potassium: 4 meq/L (ref 3.5–5.1)
Sodium: 141 meq/L (ref 135–145)

## 2023-09-23 ENCOUNTER — Ambulatory Visit: Admitting: Family Medicine

## 2023-10-27 DIAGNOSIS — G4733 Obstructive sleep apnea (adult) (pediatric): Secondary | ICD-10-CM | POA: Diagnosis not present

## 2023-10-28 ENCOUNTER — Encounter: Payer: Self-pay | Admitting: Internal Medicine

## 2023-10-28 ENCOUNTER — Ambulatory Visit: Admitting: Internal Medicine

## 2023-10-28 VITALS — BP 120/64 | HR 86 | Ht 72.0 in | Wt 252.0 lb

## 2023-10-28 DIAGNOSIS — E278 Other specified disorders of adrenal gland: Secondary | ICD-10-CM | POA: Diagnosis not present

## 2023-10-28 DIAGNOSIS — I1A0 Resistant hypertension: Secondary | ICD-10-CM

## 2023-10-28 NOTE — Progress Notes (Signed)
 Name: Jeremy Fox  MRN/ DOB: [id number], [birth year]    Age/ Sex: 78 y.o., male    PCP: Thedora Garnette HERO, MD   Reason for Endocrinology Evaluation: HTN     Date of Initial Endocrinology Evaluation: 10/28/2023     HPI: Mr. Jeremy Fox is a 78 y.o. male with a past medical history of resistant hypertension, OSA, adrenal hyperplasia , Primary hyperaldostenism. The patient presented for initial endocrinology clinic visit on 10/28/2023 for consultative assistance with his HTN .   Patient has been diagnosed with resistant hypertension He has been following up with advanced hypertension clinic he has been on lisinopril , minoxidil , labetalol , and spironolactone  He does exercise on a regular basis  He is intolerant to amlodipine    In October, 2024 he was noted with elevated Aldo/renin ratio 31.6 , renin activity 0.601 and aldosterone of 19 NG/DL , of note the patient was on spironolactone  during the testing, this was discontinued due to hyperkalemia  CT abdomen without contrast in December, 2024 which revealed plaque-like thickening of both adrenal glands which was described as stable in the interval without associated nodules or masses.  Both adrenal glands showed low-attenuation that could be compatible with a component of underlying edematous hyperplasia   Spironolactone  was discontinued January, 2025   He is accompanied by his friend  Has occasional headaches but no vision changes Denies palpitations  Has LE edema  Checks BP at home once a day 140-141/60-66  Home BP machine is 20 points higher than office sphygmomanometer  No glucocorticoid intake Strong FH of HTN    HISTORY:  Past Medical History:  Past Medical History:  Diagnosis Date   Benign prostatic hyperplasia    Per records received from 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   Past Surgical History:  Past Surgical History:  Procedure Laterality Date   CATARACT EXTRACTION W/ INTRAOCULAR LENS IMPLANT Bilateral    INCISIONAL HERNIA REPAIR N/A 07/16/2022   Procedure: LAPAROSCOPIC INCISIONAL HERNIA REPAIR WITH MESH;  Surgeon: Tanda Locus, MD;  Location: [facility];  Service: General;  Laterality: N/A;   REPLACEMENT TOTAL KNEE Bilateral    XI ROBOTIC ASSISTED SIMPLE PROSTATECTOMY N/A 06/26/2021   Procedure: XI ROBOTIC ASSISTED SIMPLE PROSTATECTOMY;  Surgeon: Alvaro Hummer, MD;  Location: [facility];  Service: Urology;  Laterality: N/A;    Social History:  reports that he has never smoked. He has never been exposed to tobacco smoke. He has never used smokeless tobacco. He reports that he does not drink alcohol and does not use drugs. Family History: family history includes Diabetes in his brother; Heart attack (age of onset: 78) in his father; Heart disease in his father.   HOME MEDICATIONS: Allergies as of 10/28/2023       Reactions   Sulfa Antibiotics Rash   Sulfamethoxazole Rash        Medication List        Accurate as of October 28, 2023  1:05 PM. If you have any questions, ask your nurse or doctor.          finasteride  5 MG tablet Commonly known as: PROSCAR  Take  1 tablet (5 mg total) by mouth daily.   furosemide  20 MG tablet Commonly known as: LASIX  Take 1 tablet (20 mg total) by mouth daily. May take an additional 20 mg dose daily as needed for increased lower leg edema.   hydrALAZINE  25 MG tablet Commonly known as: APRESOLINE  Take 1 tablet (25 mg total) by mouth every 12 (twelve) hours.   hydrochlorothiazide  25 MG tablet Commonly known as: HYDRODIURIL  Take 1 tablet (25 mg total) by mouth daily.   labetalol  200 MG  tablet Commonly known as: NORMODYNE  Take 1 tablet (200 mg total) by mouth 2 (two) times daily.   minoxidil  2.5 MG tablet Commonly known as: LONITEN  Take 2 tablets (5 mg total) by mouth 2 (two) times daily.          REVIEW OF SYSTEMS: A comprehensive ROS was conducted with the patient and is negative except as per HPI     OBJECTIVE:  VS: BP 120/64 (BP Location: Left Arm, Patient Position: Sitting, Cuff Size: Normal)   Pulse 86   Ht 6' (1.829 m)   Wt 252 lb (114.3 kg)   SpO2 97%   BMI 34.18 kg/m    Wt Readings from Last 3 Encounters:  10/28/23 252 lb (114.3 kg)  09/21/23 258 lb 3.2 oz (117.1 kg)  08/09/23 258 lb (117 kg)     EXAM: General: Pt appears well and is in NAD  Neck: General: Supple without adenopathy. Thyroid : Thyroid  size normal.  No goiter or nodules appreciated  Lungs: Clear with good BS bilat   Heart: Auscultation: RRR.  Abdomen: Soft, nontender  Extremities:  BL LE: 1+ pretibial edema   Mental Status: Judgment, insight: Intact Orientation: Oriented to time, place, and person Mood and affect: No depression, anxiety, or agitation     DATA REVIEWED:   Latest Reference Range & Units 10/28/23 13:36  C206 ACTH  6 - 50 pg/mL 10  Cortisol, Plasma mcg/dL 8.6  Metanephrine, Pl <=42 pg/mL <25  Normetanephrine, Pl <=148 pg/mL 138    Latest Reference Range & Units 10/28/23 13:36  Total Metanephrines-Plasma <=205 pg/mL 138      Latest Reference Range & Units 10/28/23 13:36  Potassium 3.5 - 5.3 mmol/L 3.9      Latest Reference Range & Units 09/21/23 16:25  Sodium 135 - 145 mEq/L 141  Potassium 3.5 - 5.1 mEq/L 4.0  Chloride 96 - 112 mEq/L 104  CO2 19 - 32 mEq/L 27  Glucose 70 - 99 mg/dL 890 (H)  BUN 6 - 23 mg/dL 30 (H)  Creatinine 9.59 - 1.50 mg/dL 8.33 (H)  Calcium 8.4 - 10.5 mg/dL 8.7  GFR >39.99 mL/min 38.72 (L)    CT Abdomen WO contrast 02/22/2023  Adrenals/Urinary Tract: Plaque-like thickening of both adrenal glands is stable in the  interval without associated nodule or mass. Both adrenal glands show low attenuation the could be compatible with a component of underlying adenomatous hyperplasia.    Old records , labs and images have been reviewed.   ASSESSMENT/PLAN/RECOMMENDATIONS:   Adrenal Hyperplasia :   - Patient with history of resistant hypertension - Aldo/PRA ratio abnormal while on spironolactone  - Spironolactone  was discontinued due to hyperkalemia 03/2023 - Adrenal imaging showed adrenal hyperplasia - Will proceed with repeat Aldo, renin, Aldo/renin ratio -Will consider confirmation - Potassium level is normal on today's labs  2.  Resistant HTN:  - Well-controlled on minoxidil , labetalol , hydralazine , and furosemide  - Counseled regarding low-salt diet -  Metanephrines, ACTH , and cortisol have all come back  normal  Follow-up in 2 months  Signed electronically by: Stefano Redgie Butts, MD  [facility] Endocrinology  [facility] [address] [place] [place], [place] [address] Phone: [phone] FAX: [phone]   CC: Thedora Garnette HERO, MD [address] [place] [address] Phone: [phone] Fax: [phone]   Return to Endocrinology clinic as below: Future Appointments  Date Time Provider Department Center  11/03/2023 10:05 AM Vannie Reche RAMAN, NP [facility]  11/09/2023  8:40 AM Thedora Garnette HERO, MD [facility]  03/16/2024  9:30 AM Neysa Reggy BIRCH, MD [facility] None

## 2023-11-01 ENCOUNTER — Ambulatory Visit: Payer: Self-pay | Admitting: Internal Medicine

## 2023-11-03 ENCOUNTER — Ambulatory Visit (HOSPITAL_BASED_OUTPATIENT_CLINIC_OR_DEPARTMENT_OTHER): Admitting: Family

## 2023-11-03 ENCOUNTER — Encounter (HOSPITAL_BASED_OUTPATIENT_CLINIC_OR_DEPARTMENT_OTHER): Payer: Self-pay | Admitting: Family

## 2023-11-03 VITALS — BP 130/50 | HR 71 | Ht 72.0 in | Wt 251.2 lb

## 2023-11-03 DIAGNOSIS — I10 Essential (primary) hypertension: Secondary | ICD-10-CM

## 2023-11-03 DIAGNOSIS — R5383 Other fatigue: Secondary | ICD-10-CM

## 2023-11-03 DIAGNOSIS — R42 Dizziness and giddiness: Secondary | ICD-10-CM

## 2023-11-03 NOTE — Progress Notes (Signed)
 Advanced Hypertension Clinic Assessment:    Date:  11/03/2023   ID:  Jeremy Fox, DOB 1946/01/16, MRN [id number]  PCP:  Thedora Garnette HERO, MD  Cardiologist:  None  Nephrologist:  Referring MD: Thedora Garnette HERO, MD   CC: Hypertension  History of Present Illness:    Jeremy Fox is a 78 y.o. 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   Established with [facility] Hypertension Clinic 11/2022. Previously followed by Dr. Krasowski. Hydrochlorothiazide  previously stopped due to hypokelmia. Amlodipine  previously stopped due to LE edema. Spironolactone  previously effective but had to be discontinued for hyperkalemia.   At visit 07/28/2023 he was referred to endocrinology for further evaluation and management of hyperaldosteronism.  Hydralazine  25 mg 3 times daily was added.  Hydrochlorothiazide , labetalol , minoxidil  were continued.  Established with Dr. Sam 10/28/2023 with normal potassium, metanephrines, normetanephrine, cortisol.  Renin aldosterone ratio still in progress.  Presents today for follow up. Reports feeling swimmy headed and lazy which he attributes to his antihypertensive regimen. He drinks 5-6 glasses of water . Eats 3 meals per day. Reports lightheadedness with position changes. No formal exercise routine but stays active in his yard but also notes using a riding mower. Reports waking up feeling tired. He does wear a CPAP which he has worn for some time and follows with Dr. Neysa of pulmonology annually. Denies palpitations. Reports bilateral LE edema in feet and lower legs worse by end of day. No claudication.   Previous antihypertensives: Amlodipine -lower extremity edema HCTZ hypokalemia Carvedilol  Chlorthalidone Clonidine  Spironolactone   Past Medical History:  Diagnosis Date   Benign prostatic hyperplasia    Per records received from  [facility], also in care everywhere   Dyslipidemia    Elevated PSA    Per records received from [facility], also in care everywhere   Essential hypertension    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/03/2015   OA (osteoarthritis) of knee 06/03/2015   OSA (obstructive sleep apnea)    Rotator cuff syndrome of left shoulder 06/03/2015   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    Past Surgical History:  Procedure Laterality Date   CATARACT EXTRACTION W/ INTRAOCULAR LENS IMPLANT Bilateral    INCISIONAL HERNIA REPAIR N/A 07/16/2022   Procedure: LAPAROSCOPIC INCISIONAL HERNIA REPAIR WITH MESH;  Surgeon: Tanda Locus, MD;  Location: [facility];  Service: General;  Laterality: N/A;   REPLACEMENT TOTAL KNEE Bilateral    XI ROBOTIC ASSISTED SIMPLE PROSTATECTOMY N/A 06/26/2021   Procedure: XI ROBOTIC ASSISTED SIMPLE PROSTATECTOMY;  Surgeon: Alvaro Hummer, MD;  Location: [facility];  Service: Urology;  Laterality: N/A;    Current Medications: Current Meds  Medication Sig   finasteride  (PROSCAR ) 5 MG tablet Take 1 tablet (5 mg total) by mouth daily.   furosemide  (LASIX ) 20 MG tablet Take 1 tablet (20 mg total) by mouth daily. May take an additional 20 mg dose daily as needed for increased lower leg edema.   hydrALAZINE  (APRESOLINE ) 25 MG tablet Take 1 tablet (25 mg total) by mouth every 12 (twelve) hours.   hydrochlorothiazide  (HYDRODIURIL ) 25 MG tablet Take 1 tablet (25 mg total) by mouth daily.   labetalol  (NORMODYNE ) 200 MG tablet Take 1 tablet (200 mg total) by mouth 2 (two) times daily.   minoxidil  (LONITEN ) 2.5 MG tablet Take 2 tablets (5 mg total) by mouth 2 (two) times daily.     Allergies:   Sulfa antibiotics and Sulfamethoxazole  Social History   Socioeconomic History   Marital status: Significant Other    Spouse name: Not on file   Number of children: 2   Years of education: Not on file   Highest education level: Associate degree: occupational, Scientist, product/process development, or vocational program   Occupational History   Occupation: Retired    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[employer]  Tobacco Use   Smoking status: Never    Passive exposure: Never   Smokeless tobacco: Never  Vaping Use   Vaping status: Never Used  Substance and Sexual Activity   Alcohol use: Never   Drug use: No   Sexual activity: Yes  Other Topics Concern   Not on file  Social History Narrative   Not on file   Social Drivers of Health   Financial Resource Strain: Low Risk  (05/06/2023)   Overall Financial Resource Strain (CARDIA)    Difficulty of Paying Living Expenses: Not hard at all  Food Insecurity: No Food Insecurity (05/06/2023)   Hunger Vital Sign    Worried About Running Out of Food in the Last Year: Never true    Ran Out of Food in the Last Year: Never true  Transportation Needs: No Transportation Needs (05/06/2023)   PRAPARE - Administrator, Civil Service (Medical): No    Lack of Transportation (Non-Medical): No  Physical Activity: Inactive (05/06/2023)   Exercise Vital Sign    Days of Exercise per Week: 4 days    Minutes of Exercise per Session: 0 min  Stress: Not on file  Social Connections: Unknown (05/06/2023)   Social Connection and Isolation Panel    Frequency of Communication with Friends and Family: Patient declined    Frequency of Social Gatherings with Friends and Family: Patient declined    Attends Religious Services: Patient declined    Database administrator or Organizations: Patient declined    Attends Engineer, structural: Not on file    Marital Status: Patient declined     Family History: The patient's family history includes Diabetes in his brother; Heart attack (age of onset: 68) in his father; Heart disease in his father.  ROS:   Please see the history of present illness.     All other systems reviewed and are negative.  EKGs/Labs/Other Studies Reviewed:         Recent Labs: 07/28/2023: TSH 2.040 09/21/2023: BUN 30; Creatinine, Ser 1.66;  Sodium 141 10/28/2023: Potassium 3.9   Recent Lipid Panel    Component Value Date/Time   CHOL 146 05/09/2023 0912   CHOL 173 05/01/2018 0803   TRIG 89.0 05/09/2023 0912   HDL 41.80 05/09/2023 0912   HDL 49 05/01/2018 0803   CHOLHDL 3 05/09/2023 0912   VLDL 17.8 05/09/2023 0912   LDLCALC 86 05/09/2023 0912   LDLCALC 109 (H) 05/01/2018 0803    Physical Exam:   VS:  BP (!) 130/50 (BP Location: Left Arm, Patient Position: Sitting, Cuff Size: Normal)   Pulse 71   Ht 6' (1.829 m)   Wt 251 lb 3.2 oz (113.9 kg)   SpO2 95%   BMI 34.07 kg/m  , BMI Body mass index is 34.07 kg/m. GENERAL:  Well appearing HEENT: Pupils equal round and reactive, fundi not visualized, oral mucosa unremarkable NECK:  No jugular venous distention, waveform within normal limits, carotid upstroke brisk and symmetric, no bruits, no thyromegaly LYMPHATICS:  No cervical adenopathy LUNGS:  Clear to auscultation bilaterally HEART:  RRR.  PMI not displaced or  sustained,S1 and S2 within normal limits, no S3, no S4, no clicks, no rubs, no murmurs ABD:  Flat, positive bowel sounds normal in frequency in pitch, no bruits, no rebound, no guarding, no midline pulsatile mass, no hepatomegaly, no splenomegaly EXT:  2 plus pulses throughout, no edema, no cyanosis no clubbing SKIN:  No rashes no nodules NEURO:  Cranial nerves II through XII grossly intact, motor grossly intact throughout PSYCH:  Cognitively intact, oriented to person place and time   ASSESSMENT/PLAN:    HTN /hyperaldosteronism secondary to bilateral adrenal hyperplasia - BP at goal by clinic readings. Reasonably controlled at home.  Continue antihypertensive regimen hydralazine  25 mg twice daily, hydrochlorothiazide  25 mg daily, labetalol  200 mg twice daily, minoxidil  5 mg twice daily.  Discussed his BP medications and BP readings are unlikely cause of his fatigue and lightheadedness. Discussed to monitor BP at home at least 2 hours after medications and  sitting for 5-10 minutes.   Fatigue / Lightheadedness - ongoing fatigue, recent thyroid  panel unremarkable. Notes lightheadedness with position changes. Plan for echocardiogram. Slow position changes, adequate hydration, knee high compression stockings recommended.   LE edema-likely etiology venous insufficiency as worsens by end of day.  Nonpitting bilateral lower extremity edema on exam.  He has been taking Lasix  20 mg twice daily since his last clinic visit with primary care provider.  Will adjust to Lasix  40 mg daily x 3 days then return to 20 mg daily with additional 20 mg as needed.  He has upcoming follow-up with primary care provider.  Unable to utilize spironolactone  due to previous hyperkalemia.  I have encouraged him to purchase zip up compression stockings for easier utilization.Plan for echocardiogram, as above.   Screening for Secondary Hypertension:     11/16/2022    9:40 AM  Causes  Drugs/Herbals Screened     - Comments rare ibuprofen .  Higher use in past.  Occasional caffeine. Rare EtOH.  Renovascular HTN Screened     - Comments check renal artery Dopplers  Sleep Apnea Screened     - Comments uses CPAP regularly  Thyroid  Disease Screened  Hyperaldosteronism Screened     - Comments Check renin/aldosterone  Pheochromocytoma N/A  Cushing's Syndrome N/A  Hyperparathyroidism Screened  Coarctation of the Aorta Screened     - Comments BP symmetric  Compliance Screened    Relevant Labs/Studies:    Latest Ref Rng & Units 10/28/2023    1:36 PM 09/21/2023    4:25 PM 08/09/2023    9:13 AM  Basic Labs  Sodium 135 - 145 mEq/L  141  134   Potassium 3.5 - 5.3 mmol/L 3.9  4.0  3.6   Creatinine 0.40 - 1.50 mg/dL  8.33  8.23        Latest Ref Rng & Units 07/28/2023   11:38 AM 09/05/2018    2:30 PM  Thyroid    TSH 0.450 - 4.500 uIU/mL 2.040  1.20        Latest Ref Rng & Units 12/09/2022    9:11 AM  Renin/Aldosterone   Aldosterone 0.0 - 30.0 ng/dL 80.9   Aldos/Renin Ratio 0.0 -  30.0 31.6        Latest Ref Rng & Units 10/28/2023    1:36 PM  Metanephrines/Catecholamines   Metanephrines <=57 pg/mL <25   Normetanephrines  <=148 pg/mL 138        Latest Ref Rng & Units 10/28/2023    1:36 PM  Cortisol  Cortisol  mcg/dL 8.6  Disposition:    FU with MD/APP/PharmD in 4 months    Medication Adjustments/Labs and Tests Ordered: Current medicines are reviewed at length with the patient today.  Concerns regarding medicines are outlined above.  Orders Placed This Encounter  Procedures   ECHOCARDIOGRAM COMPLETE   No orders of the defined types were placed in this encounter.    Signed, Reche GORMAN Finder, NP  11/03/2023 10:27 AM    [facility]

## 2023-11-03 NOTE — Patient Instructions (Addendum)
 Medication Instructions:   Change Lasix  to two tablets (40mg ) daily for 3 days. Then adjust to 20mg  (one tablet) daily with additional 20mg  only as needed.   Testing/Procedures:  Your physician has requested that you have an echocardiogram. Echocardiography is a painless test that uses sound waves to create images of your heart. It provides your doctor with information about the size and shape of your heart and how well your heart's chambers and valves are working. This procedure takes approximately one hour. There are no restrictions for this procedure. Please do NOT wear cologne, perfume, aftershave, or lotions (deodorant is allowed). Please arrive 15 minutes prior to your appointment time.  Please note: We ask at that you not bring children with you during ultrasound (echo/ vascular) testing. Due to room size and safety concerns, children are not allowed in the ultrasound rooms during exams. Our front office staff cannot provide observation of children in our lobby area while testing is being conducted. An adult accompanying a patient to their appointment will only be allowed in the ultrasound room at the discretion of the ultrasound technician under special circumstances. We apologize for any inconvenience.    Follow-Up: Please follow up in 4 months in [facility] with Dr. Raford, Reche Finder, NP or Allean Mink PharmD    Special Instructions:    To prevent or reduce lower extremity swelling: Eat a low salt diet. Salt makes the body hold onto extra fluid which causes swelling. Sit with legs elevated. For example, in the recliner or on an ottoman.  Wear knee-high compression stockings during the daytime. Ones labeled 15-20 mmHg provide good compression.  Consider ordering knee high zip up compression stockings purchased on [employer]  To prevent lightheadedness: Consider knee high compression stockings Stay well hydrated Eat regular meals Make position changes slowly Consider  seated exercises like a recumbent bicycle, chair yoga, online seated exercise video

## 2023-11-04 LAB — METANEPHRINES, PLASMA
Metanephrine, Free: <25 pg/mL (ref <=57)
Normetanephrine, Free: 138 pg/mL (ref <=148)
Total Metanephrines-Plasma: 138 pg/mL (ref <=205)

## 2023-11-04 LAB — ACTH: C206 ACTH: 10 pg/mL (ref 6–50)

## 2023-11-04 LAB — ALDOSTERONE + RENIN ACTIVITY W/ RATIO
ALDO / PRA Ratio: 4.9 ratio (ref 0.9–28.9)
Aldosterone: 7 ng/dL
Renin Activity: 1.43 ng/mL/h (ref 0.25–5.82)

## 2023-11-04 LAB — CORTISOL: Cortisol, Plasma: 8.6 ug/dL

## 2023-11-04 LAB — POTASSIUM: Potassium: 3.9 mmol/L (ref 3.5–5.3)

## 2023-11-09 ENCOUNTER — Ambulatory Visit: Admitting: Family Medicine

## 2023-11-09 VITALS — BP 124/66 | HR 64 | Temp 97.0°F | Ht 72.0 in | Wt 250.0 lb

## 2023-11-09 DIAGNOSIS — H6123 Impacted cerumen, bilateral: Secondary | ICD-10-CM

## 2023-11-09 DIAGNOSIS — I1A0 Resistant hypertension: Secondary | ICD-10-CM | POA: Diagnosis not present

## 2023-11-09 NOTE — Assessment & Plan Note (Signed)
 BP is at goal today. Continue minoxidil  2.5 mg two tabs twice daily, labetalol  200 mg twice daily, HCTZ 25 mg daily, and hydralazine  25 mg twice daily. Review of labs from endocrinology do not show any abnormal results. This suggests the patient does not have hyperaldosteronism.

## 2023-11-09 NOTE — Progress Notes (Signed)
 [facility] [address] [place] [address] Dept: [phone] Dept Fax: [phone]  Chronic Care Office Visit  Subjective:    Patient ID: Jeremy Fox, male    DOB: Jul 21, 1945, 78 y.o..   MRN: [id number]  Chief Complaint  Patient presents with   Hypertension    F/u HTN.  C/o having in ears and feeling swimmy headed in the mornings.   Declines flu shot.    History of Present Illness:  Patient is in today for reassessment of chronic medical issues.  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 It had been felt that this was due to adrenal hyperplasia/primary hyperaldosteronism. He is currently managed on minoxidil  2.5 mg 2 tabs twice daily, labetalol  200 mg twice daily, HCTZ 25 mg daily, and hydralazine  25 mg twice daily. He had been on spironolactone , but developed hyperkalemia. Since his last visit, he has seen Dr. Sam (endocrinology). She noted the prior adrenal hormone evaluation was performed while [name] was on spironolactone . she recommended repeating these labs now that he is off of this medicine.   [name] has been following with cardiology and the [facility]. He has had issues with lower leg edema felt to represent chronic venous insufficiency. However, he has complained of lightheadedness. He is currently scheduled for an echocardiogram later this month. He has furosemide  available to help manage his edema.   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 He notes she is concerned he may have wax in his ears, as his hearing has worsened recently.  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   Ventral hernia without obstruction or gangrene 06/01/2022   Dermal nevus of parietal  region of scalp 06/01/2022   Sensorineural hearing loss (SNHL) of both ears 12/11/2021   Swelling of both lower extremities 11/10/2021   Urinary retention 04/05/2021   Benign prostatic hyperplasia 10/14/2020   Class 1 obesity due to excess calories with serious comorbidity and body mass index (BMI) of 31.0 to 31.9 in adult 09/18/2020   Onychomycosis of right great toe 06/07/2019   Prominent metatarsal head, right 06/07/2019   S/P arthroscopy of right shoulder 06/05/2019   Chronic pain of both knees 05/08/2019   Chronic right shoulder pain 05/08/2019   Elevated PSA 09/05/2018   Memory loss 09/05/2018   OSA (obstructive sleep apnea) 03/29/2016   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   Dyslipidemia 06/03/2015   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/03/2015   Rotator cuff syndrome of left shoulder 06/03/2015   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   Past Surgical History:  Procedure Laterality Date   CATARACT EXTRACTION W/ INTRAOCULAR LENS IMPLANT Bilateral    INCISIONAL HERNIA REPAIR N/A 07/16/2022   Procedure: LAPAROSCOPIC INCISIONAL HERNIA REPAIR WITH MESH;  Surgeon: Tanda Locus, MD;  Location: [facility];  Service: General;  Laterality: N/A;   REPLACEMENT TOTAL KNEE Bilateral    XI ROBOTIC ASSISTED SIMPLE PROSTATECTOMY N/A 06/26/2021   Procedure: XI ROBOTIC ASSISTED SIMPLE PROSTATECTOMY;  Surgeon: Alvaro Hummer, MD;  Location: [facility];  Service: Urology;  Laterality: N/A;   Family History  Problem Relation Age of Onset   Heart attack Father 107   Heart disease Father    Diabetes Brother    Outpatient Medications Prior to Visit  Medication Sig Dispense Refill  finasteride  (PROSCAR ) 5 MG tablet Take 1 tablet (5 mg total) by mouth daily. 90 tablet 3   furosemide  (LASIX ) 20 MG tablet Take 1 tablet (20 mg total) by mouth daily. May take an additional 20 mg dose daily as needed for increased lower leg edema. 60 tablet 3   hydrALAZINE  (APRESOLINE ) 25 MG tablet Take 1 tablet (25 mg  total) by mouth every 12 (twelve) hours. 270 tablet 3   hydrochlorothiazide  (HYDRODIURIL ) 25 MG tablet Take 1 tablet (25 mg total) by mouth daily. 90 tablet 3   labetalol  (NORMODYNE ) 200 MG tablet Take 1 tablet (200 mg total) by mouth 2 (two) times daily. 180 tablet 3   minoxidil  (LONITEN ) 2.5 MG tablet Take 2 tablets (5 mg total) by mouth 2 (two) times daily. 360 tablet 3   No facility-administered medications prior to visit.   Allergies  Allergen Reactions   Sulfa Antibiotics Rash   Sulfamethoxazole Rash   Objective:   Today's Vitals   11/09/23 0834  BP: 124/66  Pulse: 64  Temp: (!) 97 F (36.1 C)  TempSrc: Temporal  SpO2: 97%  Weight: 250 lb (113.4 kg)  Height: 6' (1.829 m)   Body mass index is 33.91 kg/m.   General: Well developed, well nourished. No acute distress. HEENT: Normocephalic, non-traumatic. External ears normal. Both EAC are impacted with dried wax. Extremities: 2+ edema bilaterally. Psych: Alert and oriented. Normal mood and affect.  Health Maintenance Due  Topic Date Due   Medicare Annual Wellness (AWV)  06/02/2016     PROCEDURE- Ear Wax Removal Indication: Impacted ear wax  PARQ reviewed with patient. Verbal consent obtained. Flushed ear with mixture of warm water  and hydrogen peroxide. Lighted curette used to remove wax. The left ear was completely cleared. The right ear has some continued wax present Patient tolerated procedure with some discomfort. I advised him to use Debrox drops and a bulb syringe with warm water  at home to relieve the right-sided wax further. If not clearing, I can see him again in a week to try flushing this ear again..  Assessment & Plan:   Problem List Items Addressed This Visit       Cardiovascular and Mediastinum   Resistant hypertension - Primary   BP is at goal today. Continue minoxidil  2.5 mg two tabs twice daily, labetalol  200 mg twice daily, HCTZ 25 mg daily, and hydralazine  25 mg twice daily. Review of labs from  endocrinology do not show any abnormal results. This suggests the patient does not have hyperaldosteronism.      Other Visit Diagnoses       Bilateral impacted cerumen       Ear flushed as noted above, assisted with currette. Use Debrox and bulb syringe at home. If not relieved, RTC next week for reassessement.       Return in about 3 months (around 02/08/2024) for Reassessment.   Garnette CHRISTELLA Simpler, MD

## 2023-12-01 ENCOUNTER — Ambulatory Visit (INDEPENDENT_AMBULATORY_CARE_PROVIDER_SITE_OTHER)

## 2023-12-01 ENCOUNTER — Ambulatory Visit (HOSPITAL_BASED_OUTPATIENT_CLINIC_OR_DEPARTMENT_OTHER): Payer: Self-pay | Admitting: Family

## 2023-12-01 DIAGNOSIS — I10 Essential (primary) hypertension: Secondary | ICD-10-CM

## 2023-12-01 DIAGNOSIS — R42 Dizziness and giddiness: Secondary | ICD-10-CM | POA: Diagnosis not present

## 2023-12-01 LAB — ECHOCARDIOGRAM COMPLETE
Area-P 1/2: 4.06 cm2
P 1/2 time: 444 ms
S' Lateral: 2.34 cm

## 2023-12-23 ENCOUNTER — Encounter: Payer: Self-pay | Admitting: Internal Medicine

## 2023-12-23 ENCOUNTER — Ambulatory Visit: Admitting: Internal Medicine

## 2023-12-23 VITALS — BP 126/74 | HR 76 | Ht 72.0 in | Wt 250.5 lb

## 2023-12-23 DIAGNOSIS — R42 Dizziness and giddiness: Secondary | ICD-10-CM | POA: Diagnosis not present

## 2023-12-23 DIAGNOSIS — E278 Other specified disorders of adrenal gland: Secondary | ICD-10-CM

## 2023-12-23 DIAGNOSIS — I1A0 Resistant hypertension: Secondary | ICD-10-CM

## 2023-12-23 MED ORDER — DEXAMETHASONE 1 MG PO TABS: 1.0000 mg | ORAL_TABLET | Freq: Once | ORAL | 0 refills | Status: AC

## 2023-12-23 NOTE — Patient Instructions (Addendum)
  Please take finasteride  at bedtime to see if that would help with the dizziness   Instructions for Dexamethasone  Suppression Test   Step 1: Choose a morning when you can come to our lab at 8:00 am for a blood draw.   Step 2: On the night before the blood draw, take one 1 mg tablet of dexamethasone  at 11:30 pm.  The timing is VERY important!   Step 3: The next morning, go to the lab for blood work at 8:00 am.  Rosine do not have to be on an empty stomach, but the timing is VERY important!

## 2023-12-23 NOTE — Progress Notes (Signed)
 Name: Jeremy Fox  MRN/ DOB: [id number], [birth year]    Age/ Sex: 78 y.o., male    PCP: Thedora Garnette HERO, MD   Reason for Endocrinology Evaluation: HTN     Date of Initial Endocrinology Evaluation: 10/28/2023    HPI: Mr. Jeremy Fox is a 78 y.o. male with a past medical history of resistant hypertension, OSA, adrenal hyperplasia , Primary hyperaldostenism. The patient presented for initial endocrinology clinic visit on 10/28/2023 for consultative assistance with his HTN .   Patient has been diagnosed with resistant hypertension He has been following up with advanced hypertension clinic he has been on lisinopril , minoxidil , labetalol , and spironolactone  in the past.  Spironolactone  was discontinued in January, 2025 due to hyperkalemia   He is intolerant to amlodipine  due to lower extremity edema Hydrochlorothiazide  was discontinued due to hypokalemia   In October, 2024 he was noted with elevated Aldo/renin ratio 31.6 , renin activity 0.601 and aldosterone of 19 NG/DL , of note the patient was on spironolactone  during the testing, this was discontinued due to hyperkalemia  CT abdomen without contrast in December, 2024 which revealed plaque-like thickening of both adrenal glands which was described as stable in the interval without associated nodules or masses.  Both adrenal glands showed low-attenuation that could be compatible with a component of underlying edematous hyperplasia  Strong FH of HTN    SUBJECTIVE:    Today (12/23/23): Jeremy Fox is here for follow-up on adrenal hyperplasia.  Since his last visit here he had a follow-up with cardiology at the advanced hypertension clinic in August, 2025, he was complaining of being dizzy and swimmy headed that he attributed to his hypertensive agents He does wear a CPAP, follows with pulmonology   Cardiology recommended hydration, slow position seen changes, knee-high compression stockings due to positional dizziness, his  Lasix  was increased to 40 mg for 3 days to return to 20 mg daily    He is accompanied by his friend His dizziness is improved  Has occasional headaches which is improving  No vision changes  No palpitations No worsening of LE edema   Systolic 114-161 Diastolic 55-76   HOME ENDOCRINE MEDICATIONS:  Minoxidil  2.5 mg, 2 tabs twice daily Hydrochlorothiazide  25 mg daily  Labetalol  200 mg twice daily Hydralazine  25 mg twice daily Furosemide  20 mg daily    HISTORY:  Past Medical History:  Past Medical History:  Diagnosis Date   Benign prostatic hyperplasia    Per records received from [facility], also in care everywhere   Dyslipidemia    Elevated PSA    Per records received from [facility], also in care everywhere   Essential hypertension    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/03/2015   OA (osteoarthritis) of knee 06/03/2015   OSA (obstructive sleep apnea)    Rotator cuff syndrome of left shoulder 06/03/2015   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   Past Surgical History:  Past Surgical History:  Procedure Laterality Date   CATARACT EXTRACTION W/ INTRAOCULAR LENS IMPLANT Bilateral    INCISIONAL HERNIA REPAIR N/A 07/16/2022   Procedure: LAPAROSCOPIC INCISIONAL HERNIA REPAIR WITH MESH;  Surgeon: Tanda Locus, MD;  Location: [facility];  Service: General;  Laterality: N/A;   REPLACEMENT TOTAL KNEE Bilateral    XI ROBOTIC ASSISTED SIMPLE PROSTATECTOMY N/A 06/26/2021   Procedure: XI ROBOTIC ASSISTED SIMPLE PROSTATECTOMY;  Surgeon: Alvaro Hummer, MD;  Location: [facility];  Service: Urology;  Laterality: N/A;    Social History:  reports that he  has never smoked. He has never been exposed to tobacco smoke. He has never used smokeless tobacco. He reports that he does not drink alcohol and does not use drugs. Family History: family history includes Diabetes in his brother; Heart attack (age of onset: 40) in his father; Heart disease in his father.   HOME  MEDICATIONS: Allergies as of 12/23/2023       Reactions   Sulfa Antibiotics Rash   Sulfamethoxazole Rash        Medication List        Accurate as of December 23, 2023  8:04 AM. If you have any questions, ask your nurse or doctor.          finasteride  5 MG tablet Commonly known as: PROSCAR  Take 1 tablet (5 mg total) by mouth daily.   furosemide  20 MG tablet Commonly known as: LASIX  Take 1 tablet (20 mg total) by mouth daily. May take an additional 20 mg dose daily as needed for increased lower leg edema.   hydrALAZINE  25 MG tablet Commonly known as: APRESOLINE  Take 1 tablet (25 mg total) by mouth every 12 (twelve) hours.   hydrochlorothiazide  25 MG tablet Commonly known as: HYDRODIURIL  Take 1 tablet (25 mg total) by mouth daily.   labetalol  200 MG tablet Commonly known as: NORMODYNE  Take 1 tablet (200 mg total) by mouth 2 (two) times daily.   minoxidil  2.5 MG tablet Commonly known as: LONITEN  Take 2 tablets (5 mg total) by mouth 2 (two) times daily.          REVIEW OF SYSTEMS: A comprehensive ROS was conducted with the patient and is negative except as per HPI     OBJECTIVE:  VS: BP 126/74 (BP Location: Left Arm, Patient Position: Sitting, Cuff Size: Large)   Pulse 76   Ht 6' (1.829 m)   Wt 250 lb 8 oz (113.6 kg)   SpO2 97%   BMI 33.97 kg/m    Wt Readings from Last 3 Encounters:  12/23/23 250 lb 8 oz (113.6 kg)  11/09/23 250 lb (113.4 kg)  11/03/23 251 lb 3.2 oz (113.9 kg)     EXAM: General: Pt appears well and is in NAD  Neck: General: Supple without adenopathy. Thyroid : Thyroid  size normal.  No goiter or nodules appreciated  Lungs: Clear with good BS bilat   Heart: Auscultation: RRR.  Abdomen: Soft, nontender  Extremities:  BL LE: 1+ pretibial edema   Mental Status: Judgment, insight: Intact Orientation: Oriented to time, place, and person Mood and affect: No depression, anxiety, or agitation     DATA REVIEWED:   Latest Reference  Range & Units 10/28/23 13:36  C206 ACTH  6 - 50 pg/mL 10  ALDOSTERONE  ng/dL 7  ALDO / PRA Ratio 0.9 - 28.9 Ratio 4.9  Cortisol, Plasma mcg/dL 8.6  Metanephrine, Pl <=42 pg/mL <25  Normetanephrine, Pl <=148 pg/mL 138  Renin Activity 0.25 - 5.82 ng/mL/h 1.43     Latest Reference Range & Units 10/28/23 13:36  Potassium 3.5 - 5.3 mmol/L 3.9      Latest Reference Range & Units 09/21/23 16:25  Sodium 135 - 145 mEq/L 141  Potassium 3.5 - 5.1 mEq/L 4.0  Chloride 96 - 112 mEq/L 104  CO2 19 - 32 mEq/L 27  Glucose 70 - 99 mg/dL 890 (H)  BUN 6 - 23 mg/dL 30 (H)  Creatinine 9.59 - 1.50 mg/dL 8.33 (H)  Calcium 8.4 - 10.5 mg/dL 8.7  GFR >39.99 mL/min 38.72 (L)  CT Abdomen WO contrast 02/22/2023  Adrenals/Urinary Tract: Plaque-like thickening of both adrenal glands is stable in the interval without associated nodule or mass. Both adrenal glands show low attenuation the could be compatible with a component of underlying adenomatous hyperplasia.    Old records , labs and images have been reviewed.   ASSESSMENT/PLAN/RECOMMENDATIONS:   Adrenal Cortical Hyperplasia :   - Adrenal imaging showed adrenal hyperplasia in December, 2024 -Patient with history of resistant hypertension, he was on spironolactone  but this had to be discontinued due to hyperkalemia in January 2025 -The patient did have abnormal Aldo/PRA ratio while on spironolactone , but repeat labs through our office showed Aldosterone was normal at 7 NG/DL, renin 8.56 NG/mL/H, normal Aldo/PRA ratio 4.9 in August, 2025 - Will proceed with repeat adrenal imaging this year - Patient will return for Aldo, renin retesting    2.  Resistant HTN:  - Continues to be well-controlled on minoxidil , labetalol , hydralazine , hydrochlorothiazide  and furosemide  -  Metanephrines, ACTH , and cortisol have all come back normal - I will proceed with dexamethasone  suppression test, the patient will return fasting for 8 AM labs   3.   Dizziness:  - This has improved - But I did advise the patient to take finasteride  at bedtime rather than in the morning  Follow-up in 1 yr     I spent 30 minutes preparing to see the patient by review of recent labs, imaging and procedures, obtaining and reviewing separately obtained history, communicating with the patient/family or caregiver, ordering medications, tests or procedures, and documenting clinical information in the EHR including the differential Dx, treatment, and any further evaluation and other management    Signed electronically by: Stefano Redgie Butts, MD  [facility] Endocrinology  [facility] [address] [place] [place], [place] [address] Phone: [phone] FAX: [phone]   CC: Thedora Garnette HERO, MD [address] [place] [address] Phone: [phone] Fax: [phone]   Return to Endocrinology clinic as below: Future Appointments  Date Time Provider Department Center  02/08/2024  9:20 AM Thedora Garnette HERO, MD [facility] [place] [facility]  03/07/2024  9:15 AM Vannie Reche RAMAN, NP [facility] [phone] [facility]  03/16/2024  9:30 AM Neysa Reggy BIRCH, MD [facility] [phone] LELON Das

## 2023-12-28 ENCOUNTER — Other Ambulatory Visit

## 2023-12-28 DIAGNOSIS — E278 Other specified disorders of adrenal gland: Secondary | ICD-10-CM | POA: Diagnosis not present

## 2023-12-30 ENCOUNTER — Ambulatory Visit: Payer: Self-pay | Admitting: Internal Medicine

## 2024-01-09 ENCOUNTER — Encounter: Payer: Self-pay | Admitting: Radiology

## 2024-01-10 LAB — CORTISOL: Cortisol, Plasma: 2.6 ug/dL — ABNORMAL LOW

## 2024-01-10 LAB — DEXAMETHASONE, BLOOD: Dexamethasone, Serum: 500 ng/dL

## 2024-01-30 ENCOUNTER — Other Ambulatory Visit: Payer: Self-pay | Admitting: Family Medicine

## 2024-01-30 DIAGNOSIS — M7989 Other specified soft tissue disorders: Secondary | ICD-10-CM

## 2024-02-08 ENCOUNTER — Encounter: Payer: Self-pay | Admitting: Family Medicine

## 2024-02-08 ENCOUNTER — Ambulatory Visit (INDEPENDENT_AMBULATORY_CARE_PROVIDER_SITE_OTHER): Admitting: Family Medicine

## 2024-02-08 VITALS — BP 142/62 | HR 65 | Temp 98.1°F | Ht 72.0 in | Wt 246.2 lb

## 2024-02-08 DIAGNOSIS — I1A0 Resistant hypertension: Secondary | ICD-10-CM

## 2024-02-08 DIAGNOSIS — E278 Other specified disorders of adrenal gland: Secondary | ICD-10-CM | POA: Diagnosis not present

## 2024-02-08 DIAGNOSIS — D170 Benign lipomatous neoplasm of skin and subcutaneous tissue of head, face and neck: Secondary | ICD-10-CM | POA: Diagnosis not present

## 2024-02-08 NOTE — Assessment & Plan Note (Signed)
 Recent dexamethasone  suppression test was normal, suggesting he does not have hyperaldosteronism. Dr. Sam has recommended a follow-up CT scan of his adrenal glands. He notes he has not heard about scheduling for this. I recommend he reach back to her office about this.

## 2024-02-08 NOTE — Assessment & Plan Note (Signed)
 Reassured him that this lesion is a benign lipoma. Recommend observation for now.

## 2024-02-08 NOTE — Assessment & Plan Note (Addendum)
 BP is mildly high, but significantly improved compared to previous. Continue minoxidil  2.5 mg two tabs twice daily, labetalol  200 mg twice daily, HCTZ 25 mg daily, and hydralazine  25 mg twice daily.

## 2024-02-08 NOTE — Progress Notes (Signed)
 [facility] [address] [place] [address] Dept: [phone] Dept Fax: [phone]  Chronic Care Office Visit  Subjective:    Patient ID: Jeremy Fox, male    DOB: 1945-06-10, 78 y.o..   MRN: [id number]  Chief Complaint  Patient presents with   Hypertension    3 month f/u HTN.    Average BP 124/56 -144-69.  Wants ears cleaned.    History of Present Illness:  Patient is in today for reassessment of chronic medical conditions.   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 It had been felt that this was due to adrenal hyperplasia/primary hyperaldosteronism, but recent testing by Dr. Sam (endocrinology) has shown this is likely not the case. He is currently managed on minoxidil  2.5 mg 2 tabs twice daily, labetalol  200 mg twice daily, HCTZ 25 mg daily, and hydralazine  25 mg twice daily. His 7-day average BP at home is 145/64.   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   Ventral hernia without obstruction or gangrene 06/01/2022   Dermal nevus of parietal region of scalp 06/01/2022   Sensorineural hearing loss (SNHL) of both ears 12/11/2021   Swelling of both lower extremities 11/10/2021   Urinary retention 04/05/2021   Benign prostatic hyperplasia 10/14/2020   Class 1 obesity due to excess calories with serious comorbidity and body mass index (BMI) of 31.0 to 31.9 in adult 09/18/2020   Onychomycosis of right great toe 06/07/2019   Prominent metatarsal head, right 06/07/2019   S/P arthroscopy of right shoulder 06/05/2019   Chronic pain of both knees 05/08/2019   Chronic right shoulder pain 05/08/2019   Elevated PSA 09/05/2018   Memory loss 09/05/2018   OSA (obstructive sleep apnea) 03/29/2016    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   Dyslipidemia 06/03/2015   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/03/2015   Rotator cuff syndrome of left shoulder 06/03/2015   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   Past Surgical History:  Procedure Laterality Date   CATARACT EXTRACTION W/ INTRAOCULAR LENS IMPLANT Bilateral    INCISIONAL HERNIA REPAIR N/A 07/16/2022   Procedure: LAPAROSCOPIC INCISIONAL HERNIA REPAIR WITH MESH;  Surgeon: Tanda Locus, MD;  Location: [facility];  Service: General;  Laterality: N/A;   REPLACEMENT TOTAL KNEE Bilateral    XI ROBOTIC ASSISTED SIMPLE PROSTATECTOMY N/A 06/26/2021   Procedure: XI ROBOTIC ASSISTED SIMPLE PROSTATECTOMY;  Surgeon: Alvaro Hummer, MD;  Location: [facility];  Service: Urology;  Laterality: N/A;   Family History  Problem Relation Age of Onset   Heart attack Father 7   Heart disease Father    Diabetes Brother    Outpatient Medications Prior to Visit  Medication Sig Dispense Refill   finasteride  (PROSCAR ) 5 MG tablet Take 1 tablet (5 mg total) by mouth daily. 90 tablet 3   furosemide  (LASIX ) 20 MG tablet TAKE 1 TABLET BY MOUTH DAILY - MAY TAKE 1 ADDITIONAL TABLET DAILY AS NEEDED FOR INCREASED LOWER LEG EDEMA 60 tablet 1   hydrALAZINE  (APRESOLINE ) 25 MG tablet Take 1 tablet (25 mg total) by mouth every 12 (twelve) hours. 270 tablet 3   hydrochlorothiazide  (HYDRODIURIL ) 25 MG tablet Take 1 tablet (25 mg total) by mouth daily. 90 tablet 3   labetalol  (NORMODYNE ) 200 MG tablet Take 1 tablet (200 mg total) by mouth 2 (  two) times daily. 180 tablet 3   minoxidil  (LONITEN ) 2.5 MG tablet Take 2 tablets (5 mg total) by mouth 2 (two) times daily. 360 tablet 3   No facility-administered medications prior to visit.   Allergies  Allergen Reactions   Sulfa Antibiotics Rash   Sulfamethoxazole Rash     Objective:   Today's Vitals   02/08/24 0913  BP: (!) 142/62  Pulse: 65  Temp: 98.1 F (36.7 C)  TempSrc: Temporal  SpO2: 100%  Weight: 246 lb 3.2  oz (111.7 kg)  Height: 6' (1.829 m)   Body mass index is 33.39 kg/m.   General: Well developed, well nourished. No acute distress. HEENT: There is a 3 cm, soft, freely mobile, subcutaneous mass over the left mastoid. Psych: Alert and oriented. Normal mood and affect.  Health Maintenance Due  Topic Date Due   COVID-19 Vaccine (1) Never done   Medicare Annual Wellness (AWV)  06/02/2016   Imaging ECHOCARDIOGRAM COMPLETE Result Date: 12/01/2023 IMPRESSIONS   1. Left ventricular ejection fraction, by estimation, is 70 to 75%. The left ventricle has hyperdynamic function. The left ventricle has no regional wall motion abnormalities. There is mild concentric left ventricular hypertrophy. Left ventricular diastolic parameters are consistent with Grade II diastolic dysfunction (pseudonormalization). The average left ventricular global longitudinal strain is -21.3 %. The global longitudinal strain is normal.   2. Right ventricular systolic function is normal. The right ventricular size is mildly enlarged.   3. Left atrial size was mildly dilated.   4. The mitral valve is normal in structure. No evidence of mitral valve regurgitation. No evidence of mitral stenosis.   5. The aortic valve is tricuspid. Aortic valve regurgitation is trivial. Aortic valve sclerosis is present, with no evidence of aortic valve stenosis.   6. The inferior vena cava is normal in size with greater than 50% respiratory variability, suggesting right atrial pressure of 3 mmHg.   Assessment & Plan:   Problem List Items Addressed This Visit       Cardiovascular and Mediastinum   Resistant hypertension - Primary   BP is mildly high, but significantly improved compared to previous. Continue minoxidil  2.5 mg two tabs twice daily, labetalol  200 mg twice daily, HCTZ 25 mg daily, and hydralazine  25 mg twice daily.         Endocrine   Adrenal hyperplasia   Recent dexamethasone  suppression test was normal, suggesting he does  not have hyperaldosteronism. Dr. Sam has recommended a follow-up CT scan of his adrenal glands. He notes he has not heard about scheduling for this. I recommend he reach back to her office about this.        Other   Lipoma of head   Reassured him that this lesion is a benign lipoma. Recommend observation for now.       Return in about 4 months (around 06/08/2024) for Reassessment.   Garnette CHRISTELLA Simpler, MD  I,Emily Lagle,acting as a scribe for Garnette CHRISTELLA Simpler, MD.,have documented all relevant documentation on the behalf of Garnette CHRISTELLA Simpler, MD.  I, Garnette CHRISTELLA Simpler, MD, have reviewed all documentation for this visit. The documentation on 02/08/2024 for the exam, diagnosis, procedures, and orders are all accurate and complete.

## 2024-03-06 ENCOUNTER — Telehealth: Payer: Self-pay

## 2024-03-06 DIAGNOSIS — M7989 Other specified soft tissue disorders: Secondary | ICD-10-CM

## 2024-03-06 MED ORDER — FUROSEMIDE 20 MG PO TABS: ORAL_TABLET | ORAL | 3 refills | Status: AC

## 2024-03-06 NOTE — Telephone Encounter (Signed)
 Copied from CRM #[id number]. Topic: Clinical - Prescription Issue >> Mar 06, 2024  8:33 AM Re[facility] ORN wrote: Reason for CRM: Pt stated he takes 2 tablets a day of furosemide  (LASIX ) 20 MG tablet but the instructions say to take 1 tablet a day. Pt is running out of rx too quickly. He is asking for a new 90 day supply of Lasix  20 mg with new instructions of taking 2 a day to be sent to [facility] [address]   Please call back [phone]

## 2024-03-06 NOTE — Telephone Encounter (Signed)
 Please review and medication question .  Dm/cma

## 2024-03-07 ENCOUNTER — Ambulatory Visit (HOSPITAL_BASED_OUTPATIENT_CLINIC_OR_DEPARTMENT_OTHER): Admitting: Family

## 2024-03-07 ENCOUNTER — Encounter (HOSPITAL_BASED_OUTPATIENT_CLINIC_OR_DEPARTMENT_OTHER): Payer: Self-pay | Admitting: Family

## 2024-03-07 VITALS — BP 142/68 | HR 66 | Ht 72.0 in | Wt 248.7 lb

## 2024-03-07 DIAGNOSIS — I5189 Other ill-defined heart diseases: Secondary | ICD-10-CM | POA: Diagnosis not present

## 2024-03-07 DIAGNOSIS — I10 Essential (primary) hypertension: Secondary | ICD-10-CM

## 2024-03-07 DIAGNOSIS — R600 Localized edema: Secondary | ICD-10-CM

## 2024-03-07 NOTE — Progress Notes (Unsigned)
 "  [facility] Assessment:    Date:  03/07/2024   ID:  SUMNER KIRCHMAN, DOB [birth year], MRN [id number]  PCP:  Thedora Garnette HERO, MD  Cardiologist:  None  Nephrologist:  Referring MD: Thedora Garnette HERO, MD   CC: Hypertension  History of Present Illness:    Jeremy Fox is a 78 y.o. 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   Established with [facility] 11/2022. Previously followed by Dr. Krasowski. Hydrochlorothiazide  previously stopped due to hypokelmia. Amlodipine  previously stopped due to LE edema. Spironolactone  previously effective but had to be discontinued for hyperkalemia.   At visit 07/28/2023 he was referred to endocrinology for further evaluation and management of hyperaldosteronism.  Hydralazine  25 mg 3 times daily was added.  Hydrochlorothiazide , labetalol , minoxidil  were continued.  Established with Dr. Sam 10/28/2023 with normal potassium, metanephrines, normetanephrine, cortisol.  Renin aldosterone ratio still in progress.  Presents today for follow up. Reports feeling swimmy headed and lazy which he attributes to his antihypertensive regimen. He drinks 5-6 glasses of water . Eats 3 meals per day. Reports lightheadedness with position changes. No formal exercise routine but stays active in his yard but also notes using a riding mower. Reports waking up feeling tired. He does wear a CPAP which he has worn for some time and follows with Dr. Neysa of pulmonology annually. Denies palpitations. Reports bilateral LE edema in feet and lower legs worse by end of day. No claudication.   134/56   Previous antihypertensives: Amlodipine -lower extremity edema HCTZ hypokalemia Carvedilol  Chlorthalidone Clonidine  Spironolactone   Past Medical History:  Diagnosis Date   Benign prostatic hyperplasia    Per records  received from [facility], also in care everywhere   Dyslipidemia    Elevated PSA    Per records received from [facility], also in care everywhere   Essential hypertension    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/03/2015   OA (osteoarthritis) of knee 06/03/2015   OSA (obstructive sleep apnea)    Rotator cuff syndrome of left shoulder 06/03/2015   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    Past Surgical History:  Procedure Laterality Date   CATARACT EXTRACTION W/ INTRAOCULAR LENS IMPLANT Bilateral    INCISIONAL HERNIA REPAIR N/A 07/16/2022   Procedure: LAPAROSCOPIC INCISIONAL HERNIA REPAIR WITH MESH;  Surgeon: Tanda Locus, MD;  Location: [facility];  Service: General;  Laterality: N/A;   REPLACEMENT TOTAL KNEE Bilateral    XI ROBOTIC ASSISTED SIMPLE PROSTATECTOMY N/A 06/26/2021   Procedure: XI ROBOTIC ASSISTED SIMPLE PROSTATECTOMY;  Surgeon: Alvaro Hummer, MD;  Location: [facility];  Service: Urology;  Laterality: N/A;    Current Medications: Current Meds  Medication Sig   finasteride  (PROSCAR ) 5 MG tablet Take 1 tablet (5 mg total) by mouth daily.   furosemide  (LASIX ) 20 MG tablet TAKE 1 TABLET BY MOUTH DAILY - MAY TAKE 1 ADDITIONAL TABLET DAILY AS NEEDED FOR INCREASED LOWER LEG EDEMA   hydrALAZINE  (APRESOLINE ) 25 MG tablet Take 1 tablet (25 mg total) by mouth every 12 (twelve) hours.   hydrochlorothiazide  (HYDRODIURIL ) 25 MG tablet Take 1 tablet (25 mg total) by mouth daily.   labetalol  (NORMODYNE ) 200 MG tablet Take 1 tablet (200 mg total) by mouth 2 (two) times daily.   minoxidil  (LONITEN ) 2.5 MG tablet Take 2 tablets (5 mg total) by mouth 2 (two) times daily.     Allergies:   Sulfa antibiotics and Sulfamethoxazole  Social History   Socioeconomic History   Marital status: Significant Other    Spouse name: Not on file   Number of children: 2   Years of education: Not on file   Highest education level: Associate degree: occupational, scientist, product/process development, or vocational program   Occupational History   Occupation: Retired    Comment: Former actor for [employer]  Tobacco Use   Smoking status: Never    Passive exposure: Never   Smokeless tobacco: Never  Vaping Use   Vaping status: Never Used  Substance and Sexual Activity   Alcohol use: Never   Drug use: No   Sexual activity: Yes  Other Topics Concern   Not on file  Social History Narrative   Not on file   Social Drivers of Health   Tobacco Use: Low Risk (03/07/2024)   Patient History    Smoking Tobacco Use: Never    Smokeless Tobacco Use: Never    Passive Exposure: Never  Financial Resource Strain: Patient Declined (02/07/2024)   Overall Financial Resource Strain (CARDIA)    Difficulty of Paying Living Expenses: Patient declined  Food Insecurity: Patient Declined (02/07/2024)   Epic    Worried About Programme Researcher, Broadcasting/film/video in the Last Year: Patient declined    Barista in the Last Year: Patient declined  Transportation Needs: Patient Declined (02/07/2024)   Epic    Lack of Transportation (Medical): Patient declined    Lack of Transportation (Non-Medical): Patient declined  Physical Activity: Unknown (02/07/2024)   Exercise Vital Sign    Days of Exercise per Week: Patient declined    Minutes of Exercise per Session: Not on file  Stress: Patient Declined (02/07/2024)   [employer] of Occupational Health - Occupational Stress Questionnaire    Feeling of Stress: Patient declined  Social Connections: Unknown (02/07/2024)   Social Connection and Isolation Panel    Frequency of Communication with Friends and Family: Not on file    Frequency of Social Gatherings with Friends and Family: Patient declined    Attends Religious Services: Patient declined    Active Member of Clubs or Organizations: Patient declined    Attends Banker Meetings: Not on file    Marital Status: Patient declined  Depression (PHQ2-9): Low Risk (09/21/2023)   Depression (PHQ2-9)    PHQ-2 Score: 0   Alcohol Screen: Low Risk (11/16/2022)   Alcohol Screen    Last Alcohol Screening Score (AUDIT): 0  Housing: Patient Declined (02/07/2024)   Epic    Unable to Pay for Housing in the Last Year: Patient declined    Number of Times Moved in the Last Year: Not on file    Homeless in the Last Year: Patient declined  Utilities: Not At Risk (07/16/2022)   AHC Utilities    Threatened with loss of utilities: No  Health Literacy: Not on file     Family History: The patient's family history includes Diabetes in his brother; Heart attack (age of onset: 30) in his father; Heart disease in his father.  ROS:   Please see the history of present illness.     All other systems reviewed and are negative.  EKGs/Labs/Other Studies Reviewed:         Recent Labs: 07/28/2023: TSH 2.040 09/21/2023: BUN 30; Creatinine, Ser 1.66; Sodium 141 10/28/2023: Potassium 3.9   Recent Lipid Panel    Component Value Date/Time   CHOL 146 05/09/2023 0912   CHOL 173 05/01/2018 0803   TRIG 89.0 05/09/2023 0912  HDL 41.80 05/09/2023 0912   HDL 49 05/01/2018 0803   CHOLHDL 3 05/09/2023 0912   VLDL 17.8 05/09/2023 0912   LDLCALC 86 05/09/2023 0912   LDLCALC 109 (H) 05/01/2018 0803    Physical Exam:   VS:  BP (!) 142/68   Pulse 66   Ht 6' (1.829 m)   Wt 248 lb 11.2 oz (112.8 kg)   SpO2 96%   BMI 33.73 kg/m  , BMI Body mass index is 33.73 kg/m. GENERAL:  Well appearing HEENT: Pupils equal round and reactive, fundi not visualized, oral mucosa unremarkable NECK:  No jugular venous distention, waveform within normal limits, carotid upstroke brisk and symmetric, no bruits, no thyromegaly LYMPHATICS:  No cervical adenopathy LUNGS:  Clear to auscultation bilaterally HEART:  RRR.  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 Reasonably controlled at home.  Continue antihypertensive regimen hydralazine  25 mg twice daily, hydrochlorothiazide  25 mg daily, labetalol  200 mg twice daily, minoxidil  5 mg twice daily.  Discussed his BP medications and BP readings are unlikely cause of his fatigue and lightheadedness. Discussed to monitor BP at home at least 2 hours after medications and sitting for 5-10 minutes.   Fatigue / Lightheadedness - ongoing fatigue, recent thyroid  panel unremarkable. Notes lightheadedness with position changes. Plan for echocardiogram. Slow position changes, adequate hydration, knee high compression stockings recommended.   LE edema-likely etiology venous insufficiency as worsens by end of day.  Nonpitting bilateral lower extremity edema on exam.  He has been taking Lasix  20 mg twice daily since his last clinic visit with primary care provider.  Will adjust to Lasix  40 mg daily x 3 days then return to 20 mg daily with additional 20 mg as needed.  He has upcoming follow-up with primary care provider.  Unable to utilize spironolactone  due to previous hyperkalemia.  I have encouraged him to purchase zip up compression stockings for easier utilization.Plan for echocardiogram, as above.   Screening for Secondary Hypertension:     11/16/2022    9:40 AM  Causes  Drugs/Herbals Screened     - Comments rare ibuprofen .  Higher use in past.  Occasional caffeine. Rare EtOH.  Renovascular HTN Screened     - Comments check renal artery Dopplers  Sleep Apnea Screened     - Comments uses CPAP regularly  Thyroid  Disease  Screened  Hyperaldosteronism Screened     - Comments Check renin/aldosterone  Pheochromocytoma N/A  Cushing's Syndrome N/A  Hyperparathyroidism Screened  Coarctation of the Aorta Screened     - Comments BP symmetric  Compliance Screened    Relevant Labs/Studies:    Latest Ref Rng & Units 10/28/2023    1:36 PM 09/21/2023    4:25 PM 08/09/2023    9:13 AM  Basic Labs  Sodium 135 - 145 mEq/L  141  134   Potassium 3.5 - 5.3 mmol/L 3.9  4.0  3.6   Creatinine 0.40 -  1.50 mg/dL  8.33  8.23        Latest Ref Rng & Units 07/28/2023   11:38 AM 09/05/2018    2:30 PM  Thyroid    TSH 0.450 - 4.500 uIU/mL 2.040  1.20        Latest Ref Rng & Units 10/28/2023    1:36 PM 12/09/2022    9:11 AM  Renin/Aldosterone   Aldosterone *** ng/dL 7  80.9   Aldos/Renin Ratio 0.0 - 30.0  31.6        Latest Ref Rng & Units 10/28/2023    1:36 PM  Metanephrines/Catecholamines   Metanephrines <=57 pg/mL <25   Normetanephrines  <=148 pg/mL 138        Latest Ref Rng & Units 12/28/2023    7:56 AM 10/28/2023    1:36 PM  Cortisol  Cortisol  mcg/dL 2.6  8.6         Disposition:    FU with MD/APP/PharmD in 4 months    Medication Adjustments/Labs and Tests Ordered: Current medicines are reviewed at length with the patient today.  Concerns regarding medicines are outlined above.  No orders of the defined types were placed in this encounter.  No orders of the defined types were placed in this encounter.    Signed, Reche GORMAN Finder, NP  03/07/2024 10:07 AM    [facility] "

## 2024-03-07 NOTE — Patient Instructions (Signed)
 Medication Instructions:  Continue your current medications Your Furosemide  (Lasix ) is at the pharmacy    Follow-Up: Please follow up in 6 months in [facility] with Dr. Raford, Reche Finder, NP or Allean Mink PharmD    Special Instructions:   To prevent or reduce lower extremity swelling: Eat a low salt diet. Salt makes the body hold onto extra fluid which causes swelling. Sit with legs elevated. For example, in the recliner or on an ottoman.  Wear knee-high compression stockings during the daytime. Ones labeled 15-20 mmHg provide good compression.

## 2024-03-07 NOTE — Telephone Encounter (Signed)
 Will call in  a little bit. Dm/cma

## 2024-03-09 NOTE — Telephone Encounter (Signed)
 Patient notified and will pick it up at the pharmacy. Dm/cma

## 2024-03-11 ENCOUNTER — Other Ambulatory Visit (HOSPITAL_BASED_OUTPATIENT_CLINIC_OR_DEPARTMENT_OTHER): Payer: Self-pay | Admitting: Cardiovascular Disease

## 2024-03-13 ENCOUNTER — Ambulatory Visit

## 2024-03-13 VITALS — BP 134/64 | HR 74 | Temp 97.8°F | Ht 72.0 in | Wt 249.8 lb

## 2024-03-13 DIAGNOSIS — G4733 Obstructive sleep apnea (adult) (pediatric): Secondary | ICD-10-CM

## 2024-03-13 NOTE — Patient Instructions (Signed)
" °  VISIT SUMMARY:  You came in today to discuss your CPAP therapy for severe sleep apnea and issues with mask fit. You have been using CPAP since 2017, and your current AHI is 4.4. You recently switched to a medium-sized mask, which has improved your comfort and reduced air leakage. You also mentioned waking up frequently at night to urinate.  YOUR PLAN:  -OBSTRUCTIVE SLEEP APNEA: Obstructive sleep apnea is a condition where your airway becomes blocked during sleep, causing breathing pauses. Your severe sleep apnea is being managed with CPAP therapy. Your recent switch to a medium-sized mask has improved comfort and reduced air leakage. Continue cleaning your CPAP machine weekly with hot water  and vinegar, and wipe the mask cushion nightly. If the medium mask continues to leak, please call us .. Your next follow-up is scheduled for one year unless you experience further mask issues.  INSTRUCTIONS:  Your next follow-up appointment is scheduled for one year. If you continue to have issues with the medium mask, please call us  for further assistance.                      Contains text generated by Abridge.                                 Contains text generated by Abridge.   "

## 2024-03-13 NOTE — Progress Notes (Signed)
 "  Pulmonology Office Visit   Subjective:  Patient ID: Jeremy Fox, male    DOB: 1946-02-22  MRN: [id number]  Referred by: Thedora Garnette HERO, MD  CC:  Chief Complaint  Patient presents with   Sleep Apnea    Sleeping well.    HPI Jeremy Fox is a 79 y.o. male non-smoker with OSA on CPAP, hypertension,BPH, class I obesity, hyperlipidemia presents for follow-up for his OSA.  Respective notes from provider reviewed as appropriate to gather relevant information for patient care.   Discussed the use of AI scribe software for clinical note transcription with the patient, who gave verbal consent to proceed.  History of Present Illness   Jeremy Fox is an 79 year old male with severe sleep apnea who presents for evaluation of CPAP compliance and mask fit issues.  He has been using CPAP therapy since 2017 for severe sleep apnea, initially diagnosed with an AHI of 44. His current AHI is 4.4, but there are concerns about mask leakage. He had been using a large mask and has recently switched to a medium-sized mask; he reports feeling better and less air leakage with the new mask. He feels better with the new mask and reports less air leakage.  He typically goes to bed at 5 PM but does not use the CPAP until midnight. He wakes up around 7 AM but often returns to bed. He wakes every two hours during the night to urinate. He feels rested with the CPAP and has not experienced sinus infections since starting CPAP therapy.  He has a history of knee replacements and prostate issues, including urinary retention in 2023, which required surgical intervention. He experienced significant pain and difficulty urinating prior to the procedure.  He cleans his CPAP machine weekly using hot water  and vinegar, and wipes the mask cushion nightly. He receives supplies every three months and has had issues with previous suppliers.      OSA history: Diagnosed in 2017.  Has been on CPAP.  CPAP replaced  2023.  PAP download compliance data: [facility].  Encore/Airview ResMed 11. Pressure: 10-20 Hours of usage: 5-hour 47-minute Days used >4hr: 29/30 Leak: Significant with max 82 and 95th percentile 65.  Median is 10.5. AHI: 4.4.  PRIOR TESTS and IMAGING: PSG/HSAT:  NPSG 02/11/16- AHI 44.2/ hr, desaturation to 72%, body weight 222 lbs        No data to display          Allergies: Sulfa antibiotics and Sulfamethoxazole Current Medications[1] Past Medical History:  Diagnosis Date   Benign prostatic hyperplasia    Per records received from [facility], also in care everywhere   Dyslipidemia    Elevated PSA    Per records received from [facility], also in care everywhere   Essential hypertension    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/03/2015   OA (osteoarthritis) of knee 06/03/2015   OSA (obstructive sleep apnea)    Rotator cuff syndrome of left shoulder 06/03/2015   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   Past Surgical History:  Procedure Laterality Date   CATARACT EXTRACTION W/ INTRAOCULAR LENS IMPLANT Bilateral    INCISIONAL HERNIA REPAIR N/A 07/16/2022   Procedure: LAPAROSCOPIC INCISIONAL HERNIA REPAIR WITH MESH;  Surgeon: Tanda Locus, MD;  Location: [facility];  Service: General;  Laterality: N/A;   REPLACEMENT TOTAL KNEE Bilateral    XI ROBOTIC ASSISTED SIMPLE PROSTATECTOMY N/A 06/26/2021   Procedure: XI ROBOTIC ASSISTED SIMPLE PROSTATECTOMY;  Surgeon: Alvaro Hummer, MD;  Location: [facility];  Service: Urology;  Laterality: N/A;   Family History  Problem Relation Age of Onset   Heart attack Father 85   Heart disease Father    Diabetes Brother    Social History   Socioeconomic History   Marital status: Significant Other    Spouse name: Not on file   Number of children: 2   Years of education: Not on file   Highest education level: Associate degree: occupational, scientist, product/process development, or vocational program  Occupational History   Occupation: Retired    Comment:  Former actor for [employer]  Tobacco Use   Smoking status: Never    Passive exposure: Never   Smokeless tobacco: Never  Vaping Use   Vaping status: Never Used  Substance and Sexual Activity   Alcohol use: Never   Drug use: No   Sexual activity: Yes  Other Topics Concern   Not on file  Social History Narrative   Not on file   Social Drivers of Health   Tobacco Use: Low Risk (03/13/2024)   Patient History    Smoking Tobacco Use: Never    Smokeless Tobacco Use: Never    Passive Exposure: Never  Financial Resource Strain: Patient Declined (02/07/2024)   Overall Financial Resource Strain (CARDIA)    Difficulty of Paying Living Expenses: Patient declined  Food Insecurity: Patient Declined (02/07/2024)   Epic    Worried About Programme Researcher, Broadcasting/film/video in the Last Year: Patient declined    Barista in the Last Year: Patient declined  Transportation Needs: Patient Declined (02/07/2024)   Epic    Lack of Transportation (Medical): Patient declined    Lack of Transportation (Non-Medical): Patient declined  Physical Activity: Unknown (02/07/2024)   Exercise Vital Sign    Days of Exercise per Week: Patient declined    Minutes of Exercise per Session: Not on file  Stress: Patient Declined (02/07/2024)   [employer] of Occupational Health - Occupational Stress Questionnaire    Feeling of Stress: Patient declined  Social Connections: Unknown (02/07/2024)   Social Connection and Isolation Panel    Frequency of Communication with Friends and Family: Not on file    Frequency of Social Gatherings with Friends and Family: Patient declined    Attends Religious Services: Patient declined    Active Member of Clubs or Organizations: Patient declined    Attends Banker Meetings: Not on file    Marital Status: Patient declined  Intimate Partner Violence: Not At Risk (07/16/2022)   Humiliation, Afraid, Rape, and Kick questionnaire    Fear of Current or Ex-Partner: No     Emotionally Abused: No    Physically Abused: No    Sexually Abused: No  Depression (PHQ2-9): Low Risk (09/21/2023)   Depression (PHQ2-9)    PHQ-2 Score: 0  Alcohol Screen: Low Risk (11/16/2022)   Alcohol Screen    Last Alcohol Screening Score (AUDIT): 0  Housing: Patient Declined (02/07/2024)   Epic    Unable to Pay for Housing in the Last Year: Patient declined    Number of Times Moved in the Last Year: Not on file    Homeless in the Last Year: Patient declined  Utilities: Not At Risk (07/16/2022)   [facility] Utilities    Threatened with loss of utilities: No  Health Literacy: Not on file       Objective:  BP 134/64   Pulse 74   Temp 97.8 F (36.6 C) (Temporal)   Ht 6' (1.829  m)   Wt 249 lb 12.8 oz (113.3 kg)   SpO2 97% Comment: room air  BMI 33.88 kg/m  BMI Readings from Last 3 Encounters:  03/13/24 33.88 kg/m  03/07/24 33.73 kg/m  02/08/24 33.39 kg/m    Physical Exam: Physical Exam   ENT: Normal mucosa.  PULMONARY: Lungs clear to auscultation bilaterally, no adventitious breath sounds. CARDIOVASCULAR: Regular rate and rhythm, S1 S2 normal, no murmurs. ABDOMEN: Abdomen soft, nontender. Bowel sounds are normal. EXTREMITIES: No peripheral edema noted.       Diagnostic Review:  Last metabolic panel Lab Results  Component Value Date   GLUCOSE 109 (H) 09/21/2023   NA 141 09/21/2023   K 3.9 10/28/2023   CL 104 09/21/2023   CO2 27 09/21/2023   BUN 30 (H) 09/21/2023   CREATININE 1.66 (H) 09/21/2023   GFR 38.72 (L) 09/21/2023   CALCIUM 8.7 09/21/2023   PROT 6.0 (L) 04/05/2021   ALBUMIN 3.1 (L) 04/05/2021   BILITOT 1.0 04/05/2021   ALKPHOS 85 04/05/2021   AST 33 04/05/2021   ALT 31 04/05/2021   ANIONGAP 10 07/18/2022         Assessment & Plan:   Assessment & Plan     Assessment and Plan    Obstructive sleep apnea Severe obstructive sleep apnea managed with CPAP. Recent data shows mask leak with AHI residual of 4.4. Medium mask improved comfort and  reduced leak.  - will requested [facility] to provide mask fitting if the patient continues to notice leak despite using medium mask.  - Instructed to call if medium mask continues to leak. - Continue CPAP cleaning with hot water  and vinegar. - Scheduled follow-up in one year unless mask issues persist.         Notes from Dr Neysa Jan 2025 reviewed as to gather relevant information for patient care and formulating plan.   Return in about 1 year (around 03/13/2025).   I personally spent a total of 30 minutes in the care of the patient today including preparing to see the patient, getting/reviewing separately obtained history, performing a medically appropriate exam/evaluation, counseling and educating, placing orders, documenting clinical information in the EHR, independently interpreting results, and communicating results.   Sammi Fredericks, MD     [1]  Current Outpatient Medications:    finasteride  (PROSCAR ) 5 MG tablet, Take 1 tablet (5 mg total) by mouth daily., Disp: 90 tablet, Rfl: 3   furosemide  (LASIX ) 20 MG tablet, TAKE 1 TABLET BY MOUTH DAILY - MAY TAKE 1 ADDITIONAL TABLET DAILY AS NEEDED FOR INCREASED LOWER LEG EDEMA, Disp: 180 tablet, Rfl: 3   hydrALAZINE  (APRESOLINE ) 25 MG tablet, Take 1 tablet (25 mg total) by mouth every 12 (twelve) hours., Disp: 270 tablet, Rfl: 3   hydrochlorothiazide  (HYDRODIURIL ) 25 MG tablet, Take 1 tablet (25 mg total) by mouth daily., Disp: 90 tablet, Rfl: 3   labetalol  (NORMODYNE ) 200 MG tablet, Take 1 tablet (200 mg total) by mouth 2 (two) times daily., Disp: 180 tablet, Rfl: 3   minoxidil  (LONITEN ) 2.5 MG tablet, Take 2 tablets (5 mg total) by mouth 2 (two) times daily., Disp: 360 tablet, Rfl: 3  "

## 2024-03-16 ENCOUNTER — Ambulatory Visit: Payer: PPO | Admitting: Internal Medicine

## 2024-04-05 ENCOUNTER — Other Ambulatory Visit (HOSPITAL_BASED_OUTPATIENT_CLINIC_OR_DEPARTMENT_OTHER): Payer: Self-pay | Admitting: Cardiovascular Disease

## 2024-04-05 DIAGNOSIS — I1A0 Resistant hypertension: Secondary | ICD-10-CM

## 2024-04-12 ENCOUNTER — Other Ambulatory Visit: Payer: Self-pay | Admitting: Cardiovascular Disease

## 2024-06-11 ENCOUNTER — Ambulatory Visit: Admitting: Family Medicine

## 2024-12-25 ENCOUNTER — Ambulatory Visit: Admitting: Internal Medicine
